# Patient Record
Sex: Female | Born: 1955 | Race: White | Hispanic: No | Marital: Single | Smoking: Never smoker
Health system: Southern US, Community
[De-identification: ages and names within clinical notes are randomized; demographics above are authoritative.]

## PROBLEM LIST (undated history)

## (undated) ENCOUNTER — Emergency Department (HOSPITAL_COMMUNITY)

## (undated) DIAGNOSIS — Z923 Personal history of irradiation: Secondary | ICD-10-CM

## (undated) DIAGNOSIS — I10 Essential (primary) hypertension: Secondary | ICD-10-CM

## (undated) DIAGNOSIS — E039 Hypothyroidism, unspecified: Secondary | ICD-10-CM

## (undated) DIAGNOSIS — C50919 Malignant neoplasm of unspecified site of unspecified female breast: Secondary | ICD-10-CM

## (undated) DIAGNOSIS — C801 Malignant (primary) neoplasm, unspecified: Secondary | ICD-10-CM

## (undated) DIAGNOSIS — E079 Disorder of thyroid, unspecified: Secondary | ICD-10-CM

## (undated) DIAGNOSIS — N3281 Overactive bladder: Secondary | ICD-10-CM

## (undated) DIAGNOSIS — K219 Gastro-esophageal reflux disease without esophagitis: Secondary | ICD-10-CM

## (undated) DIAGNOSIS — M1990 Unspecified osteoarthritis, unspecified site: Secondary | ICD-10-CM

## (undated) DIAGNOSIS — E785 Hyperlipidemia, unspecified: Secondary | ICD-10-CM

## (undated) DIAGNOSIS — E669 Obesity, unspecified: Secondary | ICD-10-CM

## (undated) DIAGNOSIS — R002 Palpitations: Secondary | ICD-10-CM

## (undated) HISTORY — DX: Essential (primary) hypertension: I10

## (undated) HISTORY — PX: KNEE ARTHROSCOPY: SHX127

## (undated) HISTORY — PX: JOINT REPLACEMENT: SHX530

## (undated) HISTORY — DX: Disorder of thyroid, unspecified: E07.9

## (undated) HISTORY — DX: Hyperlipidemia, unspecified: E78.5

## (undated) HISTORY — DX: Palpitations: R00.2

## (undated) HISTORY — DX: Obesity, unspecified: E66.9

---
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
## 1978-12-22 HISTORY — PX: NOSE SURGERY: SHX723

## 1999-01-10 ENCOUNTER — Other Ambulatory Visit: Admission: RE | Admit: 1999-01-10 | Discharge: 1999-01-10 | Payer: Self-pay | Admitting: Obstetrics & Gynecology

## 2000-02-04 ENCOUNTER — Other Ambulatory Visit: Admission: RE | Admit: 2000-02-04 | Discharge: 2000-02-04 | Payer: Self-pay | Admitting: Obstetrics & Gynecology

## 2001-09-30 ENCOUNTER — Other Ambulatory Visit: Admission: RE | Admit: 2001-09-30 | Discharge: 2001-09-30 | Payer: Self-pay | Admitting: Obstetrics & Gynecology

## 2001-12-08 ENCOUNTER — Ambulatory Visit (HOSPITAL_COMMUNITY): Admission: RE | Admit: 2001-12-08 | Discharge: 2001-12-08 | Payer: Self-pay | Admitting: Obstetrics and Gynecology

## 2002-11-16 ENCOUNTER — Other Ambulatory Visit: Admission: RE | Admit: 2002-11-16 | Discharge: 2002-11-16 | Payer: Self-pay | Admitting: Obstetrics & Gynecology

## 2003-12-26 ENCOUNTER — Other Ambulatory Visit: Admission: RE | Admit: 2003-12-26 | Discharge: 2003-12-26 | Payer: Self-pay | Admitting: Obstetrics & Gynecology

## 2005-01-15 ENCOUNTER — Other Ambulatory Visit: Admission: RE | Admit: 2005-01-15 | Discharge: 2005-01-15 | Payer: Self-pay | Admitting: Obstetrics & Gynecology

## 2005-12-22 HISTORY — PX: SHOULDER SURGERY: SHX246

## 2010-05-14 ENCOUNTER — Encounter: Admission: RE | Admit: 2010-05-14 | Discharge: 2010-05-14 | Payer: Self-pay | Admitting: Obstetrics & Gynecology

## 2012-11-29 ENCOUNTER — Other Ambulatory Visit (HOSPITAL_COMMUNITY): Payer: Self-pay | Admitting: Cardiovascular Disease

## 2012-11-29 DIAGNOSIS — R011 Cardiac murmur, unspecified: Secondary | ICD-10-CM

## 2012-11-29 DIAGNOSIS — R55 Syncope and collapse: Secondary | ICD-10-CM

## 2012-12-08 ENCOUNTER — Ambulatory Visit (HOSPITAL_COMMUNITY)
Admission: RE | Admit: 2012-12-08 | Discharge: 2012-12-08 | Disposition: A | Discharge status: Home / Self Care | Payer: PRIVATE HEALTH INSURANCE | Source: Ambulatory Visit | Attending: Cardiovascular Disease | Admitting: Cardiovascular Disease

## 2012-12-08 DIAGNOSIS — R011 Cardiac murmur, unspecified: Secondary | ICD-10-CM

## 2012-12-08 DIAGNOSIS — R55 Syncope and collapse: Secondary | ICD-10-CM

## 2012-12-08 NOTE — Progress Notes (Signed)
[facility]   2D echo completed 12/08/2012.   Veda Canning, RDCS

## 2014-01-03 ENCOUNTER — Ambulatory Visit (INDEPENDENT_AMBULATORY_CARE_PROVIDER_SITE_OTHER): Payer: PRIVATE HEALTH INSURANCE | Admitting: Cardiology

## 2014-01-03 ENCOUNTER — Encounter: Payer: Self-pay | Admitting: Cardiology

## 2014-01-03 VITALS — BP 122/66 | HR 65 | Ht 66.0 in | Wt 195.0 lb

## 2014-01-03 DIAGNOSIS — R079 Chest pain, unspecified: Secondary | ICD-10-CM

## 2014-01-03 DIAGNOSIS — R002 Palpitations: Secondary | ICD-10-CM

## 2014-01-03 DIAGNOSIS — E785 Hyperlipidemia, unspecified: Secondary | ICD-10-CM

## 2014-01-03 LAB — BASIC METABOLIC PANEL
BUN: 11 mg/dL (ref 6–23)
CALCIUM: 9 mg/dL (ref 8.4–10.5)
CHLORIDE: 109 meq/L (ref 96–112)
CO2: 27 mEq/L (ref 19–32)
Creatinine, Ser: 0.9 mg/dL (ref 0.4–1.2)
GFR: 72.69 mL/min (ref >60.00)
GLUCOSE: 101 mg/dL — AB (ref 70–99)
POTASSIUM: 4.4 meq/L (ref 3.5–5.1)
SODIUM: 141 meq/L (ref 135–145)

## 2014-01-03 LAB — TSH: TSH: 0.13 u[IU]/mL — ABNORMAL LOW (ref 0.35–5.50)

## 2014-01-03 LAB — LIPID PANEL
CHOL/HDL RATIO: 3
Cholesterol: 187 mg/dL (ref 0–200)
HDL: 65.4 mg/dL (ref >39.00)
LDL CALC: 113 mg/dL — AB (ref 0–99)
TRIGLYCERIDES: 44 mg/dL (ref 0.0–149.0)
VLDL: 8.8 mg/dL (ref 0.0–40.0)

## 2014-01-03 MED ORDER — METOPROLOL SUCCINATE ER 25 MG PO TB24: 25.0000 mg | ORAL_TABLET | Freq: Every day | ORAL | Status: DC

## 2014-01-03 MED ORDER — PANTOPRAZOLE SODIUM 40 MG PO TBEC: 40.0000 mg | DELAYED_RELEASE_TABLET | Freq: Every day | ORAL | Status: DC

## 2014-01-03 NOTE — Patient Instructions (Signed)
Your physician recommends that you have  lab work today--TSH/Lipid profile/BMET.   Your physician wants you to follow-up in: 1 year with Dr Aundra Dubin. (January 2016). You will receive a reminder letter in the mail two months in advance. If you don't receive a letter, please call our office to schedule the follow-up appointment.

## 2014-01-03 NOTE — Progress Notes (Signed)
Patient ID: [name], female   DOB: Dec 31, 1955, 58 y.o.   MRN: [id number] PCP: Dr. Arelia Sneddon  58 yo with history of palpitations and atypical chest pain presents for cardiology evaluation.  She has been seen by Dr. Rollene Fare in the past and is seen by me for the first time today.  I reviewed all her old records.  She is now on Toprol XL for palpitations.  These have been thought in the past to be due to PACs.  She is doing well with less palpitations on the beta blocker.  She is under less stress at work now also.  She continues to have rare atypical chest pain.  She does not get exertional chest pain or dyspnea.  Last echo was in 12/13 and showed normal LV and RV systolic function.   ECG: NSR, normal  Labs (10/13): K 5, creatinine 0.76, LDL 139, HDL 71  PMH: 1. Hyperlipidemia 2. Hypothyroidism 3. Palpitations: Suspect due to PACs. 4. Atypical chest pain: 8/11 Cardiolite showed no ischemia or infarction.  Echo (12/13) with EF 55-60%, mild LVH, PA systolic pressure 34 mmHg.   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  FH: Mother with heart disease.   ROS: All systems reviewed and negative except as per HPI.   Current Outpatient Prescriptions  Medication Sig Dispense Refill  . estradiol (ESTRACE) 0.5 MG tablet Take 0.5 mg by mouth daily.      Marland Kitchen levothyroxine (SYNTHROID, LEVOTHROID) 100 MCG tablet Take 100 mcg by mouth daily before breakfast.      . medroxyPROGESTERone (PROVERA) 2.5 MG tablet Take 2.5 mg by mouth daily.      . metoprolol succinate (TOPROL-XL) 25 MG 24 hr tablet Take 1 tablet (25 mg total) by mouth daily.  90 tablet  3  . pantoprazole (PROTONIX) 40 MG tablet Take 1 tablet (40 mg total) by mouth daily.  90 tablet  3  . simvastatin (ZOCOR) 20 MG tablet Take 20 mg by mouth daily.       No current facility-administered medications for this visit.    BP 122/66  Pulse 65  Ht 5\' 6"  (1.676 m)  Wt 88.451 kg (195 lb)  BMI 31.49  kg/m2 General: NAD Neck: No JVD, no thyromegaly or thyroid nodule.  Lungs: Clear to auscultation bilaterally with normal respiratory effort. CV: Nondisplaced PMI.  Heart regular S1/S2, no S3/S4, no murmur.  No peripheral edema.  No carotid bruit.  Normal pedal pulses.  Abdomen: Soft, nontender, no hepatosplenomegaly, no distention.  Skin: Intact without lesions or rashes.  Neurologic: Alert and oriented x 3.  Psych: Normal affect. Extremities: No clubbing or cyanosis.  HEENT: Normal.   Assessment/Plan: 1. Palpitations: Suspect PACs.  Echo was unremarkable.  Well-suppressed by Toprol XL.  Continue Toprol XL.  Check TSH today.  2. Chest pain: Atypical.  Negative Cardiolite in 8/11.   3. Hyperlipidemia: I will check lipids today.   Followup in 1 year.   Loralie Champagne 01/03/2014

## 2014-01-06 ENCOUNTER — Telehealth: Payer: Self-pay | Admitting: Cardiology

## 2014-01-06 NOTE — Telephone Encounter (Signed)
Advised patient of lab results. Dr Stann Mainland monitors her thyroid, results sent to him.

## 2014-01-06 NOTE — Telephone Encounter (Signed)
New message   Test results.

## 2014-01-10 NOTE — Telephone Encounter (Signed)
Left message to contact Dr Stann Mainland for follow up labs per Dr Aundra Dubin, call back if any questions

## 2014-12-06 ENCOUNTER — Ambulatory Visit (INDEPENDENT_AMBULATORY_CARE_PROVIDER_SITE_OTHER): Payer: Commercial Managed Care - PPO | Admitting: Nurse Practitioner

## 2014-12-06 ENCOUNTER — Encounter: Payer: Self-pay | Admitting: Nurse Practitioner

## 2014-12-06 VITALS — BP 122/76 | HR 82 | Ht 66.0 in | Wt 215.0 lb

## 2014-12-06 DIAGNOSIS — R079 Chest pain, unspecified: Secondary | ICD-10-CM

## 2014-12-06 DIAGNOSIS — R002 Palpitations: Secondary | ICD-10-CM

## 2014-12-06 MED ORDER — HYDROCHLOROTHIAZIDE 25 MG PO TABS: 25.0000 mg | ORAL_TABLET | Freq: Every day | ORAL | Status: DC

## 2014-12-06 NOTE — Patient Instructions (Signed)
See me in one month  Restrict salt  Medium compression stockings  Limit NSAID use  I am starting HCTZ 25 mg to take each day  EKG today  Call the [place] office at [phone] if you have any questions, problems or concerns.

## 2014-12-06 NOTE — Progress Notes (Signed)
[name] Date of Birth: Apr 03, 1956 Medical Record #[id number]  History of Present Illness: Ms. Muckle is seen back today for a work in visit. Seen for Dr. Aundra Dubin. She is a 58 yo with history of palpitations and atypical chest pain. he has been seen by Dr. Rollene Fare in the past and is saw Dr. Aundra Dubin in January of 2015 for the first time. She is now on Toprol XL for palpitations. These have been thought in the past to be due to PACs.  Last echo was in 12/13 and showed normal LV and RV systolic function.   She was doing well when she was seen in January.   Comes in today. Here alone. Has noted swelling in her legs for at least a week. BP found to be elevated at work. She is eating out. Most likely getting too much salt.  No support stockings. Has gained weight. Still with some palpitations/chest pain - but not too bothersome for her. She is very limited by bursitis in her hip - not able to exercise. Has gained 20 pounds since she was here in January. She is frustrated. Taking NSAID probably every day. Was given oxycodone but does not like to take.   Current Outpatient Prescriptions  Medication Sig Dispense Refill  . clobetasol cream (TEMOVATE) 2.13 % Apply 1 application topically 2 (two) times daily.    Marland Kitchen levothyroxine (SYNTHROID, LEVOTHROID) 100 MCG tablet Take 88 mcg by mouth daily before breakfast.     . metoprolol succinate (TOPROL-XL) 25 MG 24 hr tablet Take 1 tablet (25 mg total) by mouth daily. 90 tablet 3  . mirabegron ER (MYRBETRIQ) 50 MG TB24 tablet Take 50 mg by mouth daily.    . Multiple Vitamin (MULTIVITAMIN) capsule Take 1 capsule by mouth daily.    . NON FORMULARY daily. Female support formula    . pantoprazole (PROTONIX) 40 MG tablet Take 1 tablet (40 mg total) by mouth daily. 90 tablet 3  . simvastatin (ZOCOR) 20 MG tablet Take 20 mg by mouth daily.     No current facility-administered medications for this visit.    No Known Allergies  Past Medical History  Diagnosis  Date  . Thyroid disease    PMH: 1. Hyperlipidemia 2. Hypothyroidism 3. Palpitations: Suspect due to PACs. 4. Atypical chest pain: 8/11 Cardiolite showed no ischemia or infarction. Echo (12/13) with EF 55-60%, mild LVH, PA systolic pressure 34 mmHg.    Past Surgical History  Procedure Laterality Date  . Shoulder surgery Left 2007  . Nose surgery  1980  . Tonsillectomy  1964  . Bladder stem stretch  1966    History  Smoking status  . Never Smoker   Smokeless tobacco  . Not on file    History  Alcohol Use No    Family History  Problem Relation Age of Onset  . Heart Problems Father     cardiac arrest    Review of Systems: The review of systems is per the HPI.  All other systems were reviewed and are negative.  Physical Exam: BP 122/76 mmHg  Pulse 82  Ht 5\' 6"  (1.676 m)  Wt 215 lb (97.523 kg)  BMI 34.72 kg/m2  SpO2 97%  Her BP is 150/90 by me.  Patient is very pleasant and in no acute distress. Skin is warm and dry. Color is normal.  HEENT is unremarkable. Normocephalic/atraumatic. PERRL. Sclera are nonicteric. Neck is supple. No masses. No JVD. Lungs are clear. Cardiac exam shows a regular rate  and rhythm. Abdomen is soft. Extremities are with 1+ edema. Gait and ROM are intact. No gross neurologic deficits noted.  Wt Readings from Last 3 Encounters:  12/06/14 215 lb (97.523 kg)  01/03/14 195 lb (88.451 kg)    LABORATORY DATA/PROCEDURES:  Lab Results  Component Value Date   GLUCOSE 101* 01/03/2014   CHOL 187 01/03/2014   TRIG 44.0 01/03/2014   HDL 65.40 01/03/2014   LDLCALC 113* 01/03/2014   NA 141 01/03/2014   K 4.4 01/03/2014   CL 109 01/03/2014   CREATININE 0.9 01/03/2014   BUN 11 01/03/2014   CO2 27 01/03/2014   TSH 0.13* 01/03/2014    BNP (last 3 results) No results for input(s): PROBNP in the last 8760 hours.  Echo Study Conclusions from 11/2012  - Left ventricle: The cavity size was normal. There was mild concentric hypertrophy.  Systolic function was normal. The estimated ejection fraction was in the range of 55% to 60%. Wall motion was normal; there were no regional wall motion abnormalities. Left ventricular diastolic function parameters were normal. - Mitral valve: Valve area by pressure half-time: 2.44cm^2. - Atrial septum: No defect or patent foramen ovale was identified. - Pulmonary arteries: PA peak pressure: 46mm Hg (S). Impressions:  - The right ventricular systolic pressure was increased consistent with mild pulmonary hypertension. No significant valvular disease-nl. chamber dimensions. Mild concentric LVH.   Assessment / Plan: 1. Swelling - most likely multifactorial.   2. HTN - Will add low dose HCTZ 25 mg - she brought in baseline labs which look fine.   3. Palpitations - continue beta blocker - does not seem to be much of an issue at this time.  4. Weight gain/obesity - TSH ok. Not able to exercise.   5. Hip pain - this is actually what I feel is causing this whole cascade of her symptoms. She is thinking about a 2nd opinion. Needs to be mobile in order to work on her weight, BP, etc.  Will start HCTZ. Advise support stockings and salt restriction. Limit salt. EKG today. See back in a month with her BP diary.   Patient is agreeable to this plan and will call if any problems develop in the interim.   Burtis Junes, RN, [place] [address] [place], [place]  [address] [phone]

## 2014-12-31 ENCOUNTER — Other Ambulatory Visit: Payer: Self-pay | Admitting: Cardiology

## 2015-01-05 ENCOUNTER — Encounter: Payer: Self-pay | Admitting: Nurse Practitioner

## 2015-01-05 ENCOUNTER — Ambulatory Visit (INDEPENDENT_AMBULATORY_CARE_PROVIDER_SITE_OTHER): Payer: Commercial Managed Care - PPO | Admitting: Nurse Practitioner

## 2015-01-05 VITALS — BP 128/82 | HR 64 | Ht 66.0 in | Wt 201.8 lb

## 2015-01-05 DIAGNOSIS — I10 Essential (primary) hypertension: Secondary | ICD-10-CM

## 2015-01-05 DIAGNOSIS — R609 Edema, unspecified: Secondary | ICD-10-CM

## 2015-01-05 LAB — BASIC METABOLIC PANEL
BUN: 13 mg/dL (ref 6–23)
CO2: 30 mEq/L (ref 19–32)
Calcium: 9.7 mg/dL (ref 8.4–10.5)
Chloride: 102 mEq/L (ref 96–112)
Creatinine, Ser: 0.85 mg/dL (ref 0.40–1.20)
GFR: 73.41 mL/min (ref >60.00)
Glucose, Bld: 95 mg/dL (ref 70–99)
Potassium: 3.6 mEq/L (ref 3.5–5.1)
Sodium: 139 mEq/L (ref 135–145)

## 2015-01-05 MED ORDER — HYDROCHLOROTHIAZIDE 25 MG PO TABS: 25.0000 mg | ORAL_TABLET | Freq: Every day | ORAL | Status: DC

## 2015-01-05 NOTE — Progress Notes (Signed)
Cardiology Office Note   Date:  01/05/2015   ID:  GINA LEBLOND  DOB July 15, 1956  MRN [id number]  PCP:  Leonard Downing, MD  Cardiologist:  Dr. Aundra Dubin    Chief Complaint  Patient presents with  . Hypertension    1 month check - seen for Dr. Aundra Dubin. Started on HCTZ  . Edema      History of Present Illness: [name] is a 59 y.o. female who presents for a one month check. Seen for Dr. Aundra Dubin. She has a history of palpitations and atypical chest pain. She has been seen by Dr. Rollene Fare in the past and saw Dr. Aundra Dubin in January of 2015 for the first time. She is now on Toprol XL for palpitations. These have been thought in the past to be due to PACs. Last echo was in 12/13 and showed normal LV and RV systolic function.   She was doing well when she was seen in January of 2015.  I saw her a month ago with swelling and elevated BP. Had gained weight. Was using daily NSAID. We started her on HCTZ. Had a long discussion with her about weight/mobility, etc.   Comes in today. Here alone. Doing much better. She has lost almost 15 pounds. Now on 1200 calorie diet. Not really able to exercise because of her hip but feeling so much better. BP is good at home. Good here today as well. Tolerating her medicines. Very happy with how she is doing.    Past Medical History  Diagnosis Date  . Thyroid disease   . Palpitation   . Obesity   . HLD (hyperlipidemia)     PMH: 1. Hyperlipidemia 2. Hypothyroidism 3. Palpitations: Suspect due to PACs. 4. Atypical chest pain: 8/11 Cardiolite showed no ischemia or infarction. Echo (12/13) with EF 55-60%, mild LVH, PA systolic pressure 34 mmHg.  Past Surgical History  Procedure Laterality Date  . Shoulder surgery Left 2007  . Nose surgery  1980  . Tonsillectomy  1964  . Bladder stem stretch  1966     Current Outpatient Prescriptions  Medication Sig Dispense Refill  . clobetasol cream (TEMOVATE) 5.68 % Apply 1 application topically 2  (two) times daily.    . hydrochlorothiazide (HYDRODIURIL) 25 MG tablet Take 1 tablet (25 mg total) by mouth daily. 90 tablet 3  . levothyroxine (SYNTHROID, LEVOTHROID) 100 MCG tablet Take 88 mcg by mouth daily before breakfast.     . metoprolol succinate (TOPROL-XL) 25 MG 24 hr tablet TAKE 1 TABLET BY MOUTH EVERY DAY 90 tablet 3  . mirabegron ER (MYRBETRIQ) 50 MG TB24 tablet Take 50 mg by mouth daily.    . Multiple Vitamin (MULTIVITAMIN) capsule Take 1 capsule by mouth daily.    . NON FORMULARY daily. Female support formula    . pantoprazole (PROTONIX) 40 MG tablet TAKE 1 TABLET BY MOUTH EVERY DAY 90 tablet 3  . simvastatin (ZOCOR) 20 MG tablet Take 20 mg by mouth daily.     No current facility-administered medications for this visit.    Allergies:   Review of patient's allergies indicates no known allergies.    Social History:  The patient  reports that she has never smoked. She does not have any smokeless tobacco history on file. She reports that she does not drink alcohol or use illicit drugs.   Family History:  The patient's family history includes Heart Problems in her father; Heart attack in her mother; Heart disease in her mother.  ROS:  Please see the history of present illness.  Otherwise, review of systems is positive for appetite change.   All other systems are reviewed and negative.    PHYSICAL EXAM: VS:  BP 128/82 mmHg  Pulse 64  Ht 5\' 6"  (1.676 m)  Wt 201 lb 12.8 oz (91.536 kg)  BMI 32.59 kg/m2 , BMI Body mass index is 32.59 kg/(m^2). GEN: Well nourished, well developed, in no acute distress. Her weight is down 14 pounds over the last month.  HEENT: Normal.  Neck: Supple.  Cardiac: RRR; no murmurs, rubs, or gallops. No edema.  Respiratory:  Clear to auscultation bilaterally with normal work of breathing.  GI: Soft.  MS: No deformity or atrophy. Gait and ROM intact.  Skin: Warm and dry. Color normal.  Neuro:  Strength and sensation are intact with no gross focal  deficits.  Psych: Very pleasant and upbeat. Normal affect.   EKG:  EKG is not ordered today.   Recent Labs:  Lab Results  Component Value Date   GLUCOSE 101* 01/03/2014   CHOL 187 01/03/2014   TRIG 44.0 01/03/2014   HDL 65.40 01/03/2014   LDLCALC 113* 01/03/2014   NA 141 01/03/2014   K 4.4 01/03/2014   CL 109 01/03/2014   CREATININE 0.9 01/03/2014   BUN 11 01/03/2014   CO2 27 01/03/2014   TSH 0.13* 01/03/2014     Wt Readings from Last 3 Encounters:  01/05/15 201 lb 12.8 oz (91.536 kg)  12/06/14 215 lb (97.523 kg)  01/03/14 195 lb (88.451 kg)      Other Studies Reviewed:  Echo Study Conclusions from 11/2012  - Left ventricle: The cavity size was normal. There was mild concentric hypertrophy. Systolic function was normal. The estimated ejection fraction was in the range of 55% to 60%. Wall motion was normal; there were no regional wall motion abnormalities. Left ventricular diastolic function parameters were normal. - Mitral valve: Valve area by pressure half-time: 2.44cm^2. - Atrial septum: No defect or patent foramen ovale was identified. - Pulmonary arteries: PA peak pressure: 49mm Hg (S). Impressions:  - The right ventricular systolic pressure was increased consistent with mild pulmonary hypertension. No significant valvular disease-nl. chamber dimensions. Mild concentric LVH.  ASSESSMENT AND PLAN:  1. Swelling - most likely multifactorial. Now resolved with diet change and HCTZ.   2. HTN - greatly improved with current therapy and salt restriction.  3. Palpitations - continue beta blocker - does not seem to be much of an issue at this time.  4. Weight gain/obesity - actively losing weight.   5. Hip pain - chronic.  She is doing well. Recheck BMET today. I have left her on her current regimen. She has follow up with Dr. Aundra Dubin next month. I will be happy to see back as needed.   Patient is agreeable to this plan and will call if  any problems develop in the interim.    Orders Placed This Encounter  Procedures  . Basic metabolic panel    Disposition:   FU with Aundra Dubin as planned next month.    Amie Critchley, NP  01/05/2015 8:48 AM    [address], [place], [place]  [address] Phone: [phone]; Fax: [phone]

## 2015-01-05 NOTE — Patient Instructions (Addendum)
We will be checking the following labs today BMET  Stay on your current medicines - I refilled the HCTZ today  Keep up the good work  See Dr. Aundra Dubin as planned  Call the [place] office at [phone] if you have any questions, problems or concerns.

## 2015-01-29 ENCOUNTER — Ambulatory Visit: Payer: PRIVATE HEALTH INSURANCE | Admitting: Cardiology

## 2015-02-01 ENCOUNTER — Encounter: Payer: Self-pay | Admitting: Cardiology

## 2015-02-01 ENCOUNTER — Ambulatory Visit (INDEPENDENT_AMBULATORY_CARE_PROVIDER_SITE_OTHER): Payer: Commercial Managed Care - PPO | Admitting: Cardiology

## 2015-02-01 VITALS — BP 126/62 | HR 75 | Ht 66.0 in | Wt 197.0 lb

## 2015-02-01 DIAGNOSIS — R002 Palpitations: Secondary | ICD-10-CM

## 2015-02-01 DIAGNOSIS — E785 Hyperlipidemia, unspecified: Secondary | ICD-10-CM

## 2015-02-01 DIAGNOSIS — R0789 Other chest pain: Secondary | ICD-10-CM | POA: Diagnosis not present

## 2015-02-01 NOTE — Patient Instructions (Signed)
Your physician recommends that you to have a  lipid profile today.  You have been referred to [facility] to establish with a primary care doctor.   Your physician wants you to follow-up in: 1 year with Dr Aundra Dubin. (February 2017).  You will receive a reminder letter in the mail two months in advance. If you don't receive a letter, please call our office to schedule the follow-up appointment.

## 2015-02-02 LAB — LIPID PANEL
CHOLESTEROL: 164 mg/dL (ref 0–200)
HDL: 56.1 mg/dL (ref >39.00)
LDL Cholesterol: 88 mg/dL (ref 0–99)
NONHDL: 107.9
Total CHOL/HDL Ratio: 3
Triglycerides: 102 mg/dL (ref 0.0–149.0)
VLDL: 20.4 mg/dL (ref 0.0–40.0)

## 2015-02-03 NOTE — Progress Notes (Signed)
Patient ID: [name], female   DOB: [birth year], 59 y.o.   MRN: [id number] PCP: Dr. Arelia Sneddon  59 yo with history of palpitations and atypical chest pain presents for cardiology evaluation.  She is now on Toprol XL for palpitations.  These have been thought in the past to be due to PACs.  She is doing well with less palpitations on the beta blocker.  She continues to have rare atypical chest pain.  She does not get exertional chest pain or dyspnea.  Last echo was in 12/13 and showed normal LV and RV systolic function.   She was seen by Truitt Merle recently due to lower extremity edema and high BP.  She had been using NSAIDs daily. She stopped NSAIDs and has been working on weight loss.  Weight is down 4 lbs today.  She is now on HCTZ.   Labs (10/13): K 5, creatinine 0.76, LDL 139, HDL 71 Labs (1/15): LDL 113 Labs (1/16): K 3.6, creatinine 0.85  PMH: 1. Hyperlipidemia 2. Hypothyroidism 3. Palpitations: Suspect due to PACs. 4. Atypical chest pain: 8/11 Cardiolite showed no ischemia or infarction.  Echo (12/13) with EF 55-60%, mild LVH, PA systolic pressure 34 mmHg.   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   ROS: All systems reviewed and negative except as per HPI.   Current Outpatient Prescriptions  Medication Sig Dispense Refill  . clobetasol cream (TEMOVATE) 2.50 % Apply 1 application topically 2 (two) times daily.    . hydrochlorothiazide (HYDRODIURIL) 25 MG tablet Take 1 tablet (25 mg total) by mouth daily. 90 tablet 3  . levothyroxine (SYNTHROID, LEVOTHROID) 88 MCG tablet Take 88 mcg by mouth daily.  12  . metoprolol succinate (TOPROL-XL) 25 MG 24 hr tablet TAKE 1 TABLET BY MOUTH EVERY DAY (Patient taking differently: TAKE 1/2 TABLET BY MOUTH EVERY DAY) 90 tablet 3  . mirabegron ER (MYRBETRIQ) 50 MG TB24 tablet Take 50 mg by mouth daily.    . Multiple Vitamin (MULTIVITAMIN) capsule Take 1 capsule by mouth  daily.    . NON FORMULARY daily. Female support formula    . pantoprazole (PROTONIX) 40 MG tablet TAKE 1 TABLET BY MOUTH EVERY DAY 90 tablet 3  . simvastatin (ZOCOR) 20 MG tablet Take 20 mg by mouth daily.    . VESICARE 10 MG tablet Take 1 tablet by mouth daily.  0   No current facility-administered medications for this visit.    BP 126/62 mmHg  Pulse 75  Ht 5\' 6"  (1.676 m)  Wt 197 lb (89.359 kg)  BMI 31.81 kg/m2  SpO2 98% General: NAD Neck: No JVD, no thyromegaly or thyroid nodule.  Lungs: Clear to auscultation bilaterally with normal respiratory effort. CV: Nondisplaced PMI.  Heart regular S1/S2, no S3/S4, no murmur.  No peripheral edema.  No carotid bruit.  Normal pedal pulses.  Abdomen: Soft, nontender, no hepatosplenomegaly, no distention.  Skin: Intact without lesions or rashes.  Neurologic: Alert and oriented x 3.  Psych: Normal affect. Extremities: No clubbing or cyanosis.  HEENT: Normal.   Assessment/Plan: 1. Palpitations: Rare, suspect PACs.  Well-suppressed by Toprol XL.  Continue Toprol XL.   2. Chest pain: Atypical.  Negative Cardiolite in 8/11.   3. Hyperlipidemia: I will check lipids today.  4. HTN: BP controlled.   Followup in 1 year.   Loralie Champagne 02/03/2015

## 2015-04-30 ENCOUNTER — Ambulatory Visit: Payer: Commercial Managed Care - PPO | Admitting: Internal Medicine

## 2015-05-10 ENCOUNTER — Encounter: Payer: Self-pay | Admitting: Internal Medicine

## 2015-05-10 ENCOUNTER — Ambulatory Visit (INDEPENDENT_AMBULATORY_CARE_PROVIDER_SITE_OTHER): Payer: Commercial Managed Care - PPO | Admitting: Internal Medicine

## 2015-05-10 VITALS — BP 108/78 | HR 82 | Temp 98.0°F | Resp 14 | Ht 66.0 in | Wt 192.8 lb

## 2015-05-10 DIAGNOSIS — Z0000 Encounter for general adult medical examination without abnormal findings: Secondary | ICD-10-CM

## 2015-05-10 DIAGNOSIS — E669 Obesity, unspecified: Secondary | ICD-10-CM

## 2015-05-10 DIAGNOSIS — E039 Hypothyroidism, unspecified: Secondary | ICD-10-CM

## 2015-05-10 DIAGNOSIS — E785 Hyperlipidemia, unspecified: Secondary | ICD-10-CM

## 2015-05-10 DIAGNOSIS — I10 Essential (primary) hypertension: Secondary | ICD-10-CM

## 2015-05-10 NOTE — Patient Instructions (Addendum)
We will have you see the sports medicine doctor to see what answers we can get you. I will also give you some information today about it.   Come back with [place] in about 6-12 months. Think about getting the colonoscopy in the next year or two to make sure you do not have more polyps.   Trochanteric Bursitis You have hip pain due to trochanteric bursitis. Bursitis means that the sack near the outside of the hip is filled with fluid and inflamed. This sack is made up of protective soft tissue. The pain from trochanteric bursitis can be severe and keep you from sleep. It can radiate to the buttocks or down the outside of the thigh to the knee. The pain is almost always worse when rising from the seated or lying position and with walking. Pain can improve after you take a few steps. It happens more often in people with hip joint and lumbar spine problems, such as arthritis or previous surgery. Very rarely the trochanteric bursa can become infected, and antibiotics and/or surgery may be needed. Treatment often includes an injection of local anesthetic mixed with cortisone medicine. This medicine is injected into the area where it is most tender over the hip. Repeat injections may be necessary if the response to treatment is slow. You can apply ice packs over the tender area for 30 minutes every 2 hours for the next few days. Anti-inflammatory and/or narcotic pain medicine may also be helpful. Limit your activity for the next few days if the pain continues. See your caregiver in 5-10 days if you are not greatly improved.  SEEK IMMEDIATE MEDICAL CARE IF:  You develop severe pain, fever, or increased redness.  You have pain that radiates below the knee. EXERCISES STRETCHING EXERCISES - Trochanteric Bursitis  These exercises may help you when beginning to rehabilitate your injury. Your symptoms may resolve with or without further involvement from your physician, physical therapist, or athletic trainer. While  completing these exercises, remember:   Restoring tissue flexibility helps normal motion to return to the joints. This allows healthier, less painful movement and activity.  An effective stretch should be held for at least 30 seconds.  A stretch should never be painful. You should only feel a gentle lengthening or release in the stretched tissue. STRETCH - Iliotibial Band  On the floor or bed, lie on your side so your injured leg is on top. Bend your knee and grab your ankle.  Slowly bring your knee back so that your thigh is in line with your trunk. Keep your heel at your buttocks and gently arch your back so your head, shoulders and hips line up.  Slowly lower your leg so that your knee approaches the floor/bed until you feel a gentle stretch on the outside of your thigh. If you do not feel a stretch and your knee will not fall farther, place the heel of your opposite foot on top of your knee and pull your thigh down farther.  Hold this stretch for __________ seconds.  Repeat __________ times. Complete this exercise __________ times per day. STRETCH - Hamstrings, Supine   Lie on your back. Loop a belt or towel over the ball of your foot as shown.  Straighten your knee and slowly pull on the belt to raise your injured leg. Do not allow the knee to bend. Keep your opposite leg flat on the floor.  Raise the leg until you feel a gentle stretch behind your knee or thigh. Hold  this position for __________ seconds.  Repeat __________ times. Complete this stretch __________ times per day. STRETCH - Quadriceps, Prone   Lie on your stomach on a firm surface, such as a bed or padded floor.  Bend your knee and grasp your ankle. If you are unable to reach your ankle or pant leg, use a belt around your foot to lengthen your reach.  Gently pull your heel toward your buttocks. Your knee should not slide out to the side. You should feel a stretch in the front of your thigh and/or knee.  Hold this  position for __________ seconds.  Repeat __________ times. Complete this stretch __________ times per day. STRETCHING - Hip Flexors, Lunge Half kneel with your knee on the floor and your opposite knee bent and directly over your ankle.  Keep good posture with your head over your shoulders. Tighten your buttocks to point your tailbone downward; this will prevent your back from arching too much.  You should feel a gentle stretch in the front of your thigh and/or hip. If you do not feel any resistance, slightly slide your opposite foot forward and then slowly lunge forward so your knee once again lines up over your ankle. Be sure your tailbone remains pointed downward.  Hold this stretch for __________ seconds.  Repeat __________ times. Complete this stretch __________ times per day. STRETCH - Adductors, Lunge  While standing, spread your legs.  Lean away from your injured leg by bending your opposite knee. You may rest your hands on your thigh for balance.  You should feel a stretch in your inner thigh. Hold for __________ seconds.  Repeat __________ times. Complete this exercise __________ times per day. Document Released: 01/15/2005 Document Revised: 04/24/2014 Document Reviewed: 03/22/2009 [facility] Patient Information 2015 [place], [place]. This information is not intended to replace advice given to you by your health care provider. Make sure you discuss any questions you have with your health care provider.   Health Maintenance Adopting a healthy lifestyle and getting preventive care can go a long way to promote health and wellness. Talk with your health care provider about what schedule of regular examinations is right for you. This is a good chance for you to check in with your provider about disease prevention and staying healthy. In between checkups, there are plenty of things you can do on your own. Experts have done a lot of research about which lifestyle changes and preventive  measures are most likely to keep you healthy. Ask your health care provider for more information. WEIGHT AND DIET  Eat a healthy diet  Be sure to include plenty of vegetables, fruits, low-fat dairy products, and lean protein.  Do not eat a lot of foods high in solid fats, added sugars, or salt.  Get regular exercise. This is one of the most important things you can do for your health.  Most adults should exercise for at least 150 minutes each week. The exercise should increase your heart rate and make you sweat (moderate-intensity exercise).  Most adults should also do strengthening exercises at least twice a week. This is in addition to the moderate-intensity exercise.  Maintain a healthy weight  Body mass index (BMI) is a measurement that can be used to identify possible weight problems. It estimates body fat based on height and weight. Your health care provider can help determine your BMI and help you achieve or maintain a healthy weight.  For females 50 years of age and older:   A BMI  below 18.5 is considered underweight.  A BMI of 18.5 to 24.9 is normal.  A BMI of 25 to 29.9 is considered overweight.  A BMI of 30 and above is considered obese.  Watch levels of cholesterol and blood lipids  You should start having your blood tested for lipids and cholesterol at 57 years of age, then have this test every 5 years.  You may need to have your cholesterol levels checked more often if:  Your lipid or cholesterol levels are high.  You are older than 57 years of age.  You are at high risk for heart disease.  CANCER SCREENING   Lung Cancer  Lung cancer screening is recommended for adults 48-10 years old who are at high risk for lung cancer because of a history of smoking.  A yearly low-dose CT scan of the lungs is recommended for people who:  Currently smoke.  Have quit within the past 15 years.  Have at least a 30-pack-year history of smoking. A pack year is smoking  an average of one pack of cigarettes a day for 1 year.  Yearly screening should continue until it has been 15 years since you quit.  Yearly screening should stop if you develop a health problem that would prevent you from having lung cancer treatment.  Breast Cancer  Practice breast self-awareness. This means understanding how your breasts normally appear and feel.  It also means doing regular breast self-exams. Let your health care provider know about any changes, no matter how small.  If you are in your 20s or 30s, you should have a clinical breast exam (CBE) by a health care provider every 1-3 years as part of a regular health exam.  If you are 34 or older, have a CBE every year. Also consider having a breast X-ray (mammogram) every year.  If you have a family history of breast cancer, talk to your health care provider about genetic screening.  If you are at high risk for breast cancer, talk to your health care provider about having an MRI and a mammogram every year.  Breast cancer gene (BRCA) assessment is recommended for women who have family members with BRCA-related cancers. BRCA-related cancers include:  Breast.  Ovarian.  Tubal.  Peritoneal cancers.  Results of the assessment will determine the need for genetic counseling and BRCA1 and BRCA2 testing. Cervical Cancer Routine pelvic examinations to screen for cervical cancer are no longer recommended for nonpregnant women who are considered low risk for cancer of the pelvic organs (ovaries, uterus, and vagina) and who do not have symptoms. A pelvic examination may be necessary if you have symptoms including those associated with pelvic infections. Ask your health care provider if a screening pelvic exam is right for you.   The Pap test is the screening test for cervical cancer for women who are considered at risk.  If you had a hysterectomy for a problem that was not cancer or a condition that could lead to cancer, then you  no longer need Pap tests.  If you are older than 65 years, and you have had normal Pap tests for the past 10 years, you no longer need to have Pap tests.  If you have had past treatment for cervical cancer or a condition that could lead to cancer, you need Pap tests and screening for cancer for at least 20 years after your treatment.  If you no longer get a Pap test, assess your risk factors if they change (such as  having a new sexual partner). This can affect whether you should start being screened again.  Some women have medical problems that increase their chance of getting cervical cancer. If this is the case for you, your health care provider may recommend more frequent screening and Pap tests.  The human papillomavirus (HPV) test is another test that may be used for cervical cancer screening. The HPV test looks for the virus that can cause cell changes in the cervix. The cells collected during the Pap test can be tested for HPV.  The HPV test can be used to screen women 55 years of age and older. Getting tested for HPV can extend the interval between normal Pap tests from three to five years.  An HPV test also should be used to screen women of any age who have unclear Pap test results.  After 57 years of age, women should have HPV testing as often as Pap tests.  Colorectal Cancer  This type of cancer can be detected and often prevented.  Routine colorectal cancer screening usually begins at 57 years of age and continues through 56 years of age.  Your health care provider may recommend screening at an earlier age if you have risk factors for colon cancer.  Your health care provider may also recommend using home test kits to check for hidden blood in the stool.  A small camera at the end of a tube can be used to examine your colon directly (sigmoidoscopy or colonoscopy). This is done to check for the earliest forms of colorectal cancer.  Routine screening usually begins at age  34.  Direct examination of the colon should be repeated every 5-10 years through 57 years of age. However, you may need to be screened more often if early forms of precancerous polyps or small growths are found. Skin Cancer  Check your skin from head to toe regularly.  Tell your health care provider about any new moles or changes in moles, especially if there is a change in a mole's shape or color.  Also tell your health care provider if you have a mole that is larger than the size of a pencil eraser.  Always use sunscreen. Apply sunscreen liberally and repeatedly throughout the day.  Protect yourself by wearing long sleeves, pants, a wide-brimmed hat, and sunglasses whenever you are outside. HEART DISEASE, DIABETES, AND HIGH BLOOD PRESSURE   Have your blood pressure checked at least every 1-2 years. High blood pressure causes heart disease and increases the risk of stroke.  If you are between 2 years and 51 years old, ask your health care provider if you should take aspirin to prevent strokes.  Have regular diabetes screenings. This involves taking a blood sample to check your fasting blood sugar level.  If you are at a normal weight and have a low risk for diabetes, have this test once every three years after 57 years of age.  If you are overweight and have a high risk for diabetes, consider being tested at a younger age or more often. PREVENTING INFECTION  Hepatitis B  If you have a higher risk for hepatitis B, you should be screened for this virus. You are considered at high risk for hepatitis B if:  You were born in a country where hepatitis B is common. Ask your health care provider which countries are considered high risk.  Your parents were born in a high-risk country, and you have not been immunized against hepatitis B (hepatitis B vaccine).  You have HIV or AIDS.  You use needles to inject street drugs.  You live with someone who has hepatitis B.  You have had sex  with someone who has hepatitis B.  You get hemodialysis treatment.  You take certain medicines for conditions, including cancer, organ transplantation, and autoimmune conditions. Hepatitis C  Blood testing is recommended for:  Everyone born from [birth year] through 1963.  Anyone with known risk factors for hepatitis C. Sexually transmitted infections (STIs)  You should be screened for sexually transmitted infections (STIs) including gonorrhea and chlamydia if:  You are sexually active and are younger than 57 years of age.  You are older than 57 years of age and your health care provider tells you that you are at risk for this type of infection.  Your sexual activity has changed since you were last screened and you are at an increased risk for chlamydia or gonorrhea. Ask your health care provider if you are at risk.  If you do not have HIV, but are at risk, it may be recommended that you take a prescription medicine daily to prevent HIV infection. This is called pre-exposure prophylaxis (PrEP). You are considered at risk if:  You are sexually active and do not regularly use condoms or know the HIV status of your partner(s).  You take drugs by injection.  You are sexually active with a partner who has HIV. Talk with your health care provider about whether you are at high risk of being infected with HIV. If you choose to begin PrEP, you should first be tested for HIV. You should then be tested every 3 months for as long as you are taking PrEP.  PREGNANCY   If you are premenopausal and you may become pregnant, ask your health care provider about preconception counseling.  If you may become pregnant, take 400 to 800 micrograms (mcg) of folic acid every day.  If you want to prevent pregnancy, talk to your health care provider about birth control (contraception). OSTEOPOROSIS AND MENOPAUSE   Osteoporosis is a disease in which the bones lose minerals and strength with aging. This can result  in serious bone fractures. Your risk for osteoporosis can be identified using a bone density scan.  If you are 86 years of age or older, or if you are at risk for osteoporosis and fractures, ask your health care provider if you should be screened.  Ask your health care provider whether you should take a calcium or vitamin D supplement to lower your risk for osteoporosis.  Menopause may have certain physical symptoms and risks.  Hormone replacement therapy may reduce some of these symptoms and risks. Talk to your health care provider about whether hormone replacement therapy is right for you.  HOME CARE INSTRUCTIONS   Schedule regular health, dental, and eye exams.  Stay current with your immunizations.   Do not use any tobacco products including cigarettes, chewing tobacco, or electronic cigarettes.  If you are pregnant, do not drink alcohol.  If you are breastfeeding, limit how much and how often you drink alcohol.  Limit alcohol intake to no more than 1 drink per day for nonpregnant women. One drink equals 12 ounces of beer, 5 ounces of wine, or 1 ounces of hard liquor.  Do not use street drugs.  Do not share needles.  Ask your health care provider for help if you need support or information about quitting drugs.  Tell your health care provider if you often feel depressed.  Tell  your health care provider if you have ever been abused or do not feel safe at home. Document Released: 06/23/2011 Document Revised: 04/24/2014 Document Reviewed: 11/09/2013 [facility] Patient Information 2015 [place], [place]. This information is not intended to replace advice given to you by your health care provider. Make sure you discuss any questions you have with your health care provider.

## 2015-05-10 NOTE — Progress Notes (Signed)
Pre visit review using our clinic review tool, if applicable. No additional management support is needed unless otherwise documented below in the visit note. 

## 2015-05-11 ENCOUNTER — Encounter: Payer: Self-pay | Admitting: Internal Medicine

## 2015-05-11 DIAGNOSIS — I10 Essential (primary) hypertension: Secondary | ICD-10-CM | POA: Insufficient documentation

## 2015-05-11 DIAGNOSIS — E669 Obesity, unspecified: Secondary | ICD-10-CM | POA: Insufficient documentation

## 2015-05-11 DIAGNOSIS — Z0000 Encounter for general adult medical examination without abnormal findings: Secondary | ICD-10-CM | POA: Insufficient documentation

## 2015-05-11 DIAGNOSIS — E039 Hypothyroidism, unspecified: Secondary | ICD-10-CM | POA: Insufficient documentation

## 2015-05-11 NOTE — Assessment & Plan Note (Signed)
Talked to her about the fact that she is overdue for repeat colonoscopy repeat due in 5 years done 6 years ago) and she will think about it. Pap smear up to date. Mammogram up to date. She thinks she has gotten tetanus in last 10 years and will obtain her records. Talked to her about diet and exercise and she is working on losing weight and has lost some weight already by increasing exercise. She declines need for nutritionist today.

## 2015-05-11 NOTE — Assessment & Plan Note (Signed)
Stable on zocor 20 mg daily, no side effects, reviewed recent lipid panel at goal.

## 2015-05-11 NOTE — Assessment & Plan Note (Signed)
She is working on losing weight and has already lost about 10 pounds by increasing exercise and she intends to continue.

## 2015-05-11 NOTE — Assessment & Plan Note (Signed)
Taking metoprolol and hctz and doing well. Recent BMP reviewed and without complications. Continue same.

## 2015-05-11 NOTE — Progress Notes (Signed)
   Subjective:    Patient ID: [name], female    DOB: May 27, 1956, 59 y.o.   MRN: [id number]  HPI The patient is coming in as a new patient for wellness. She does have some chronic medical problems which are all stable (see A/P for status and treatment).   PMH, [facility], social history reviewed and updated.   Review of Systems  Constitutional: Negative for fever, activity change, appetite change, fatigue and unexpected weight change.  HENT: Negative.   Eyes: Negative.   Respiratory: Negative for cough, chest tightness, shortness of breath and wheezing.   Cardiovascular: Negative for chest pain, palpitations and leg swelling.  Gastrointestinal: Negative for nausea, abdominal pain, diarrhea, constipation and abdominal distention.  Musculoskeletal: Negative.   Skin: Negative.   Neurological: Negative.   Psychiatric/Behavioral: Negative.       Objective:   Physical Exam  Constitutional: She is oriented to person, place, and time. She appears well-developed and well-nourished.  HENT:  Head: Normocephalic and atraumatic.  Eyes: EOM are normal.  Neck: Normal range of motion.  Cardiovascular: Normal rate and regular rhythm.   Pulmonary/Chest: Effort normal. No respiratory distress. She has no wheezes.  Abdominal: Soft. She exhibits no distension. There is no tenderness. There is no rebound.  Musculoskeletal: She exhibits no edema.  Neurological: She is alert and oriented to person, place, and time. Coordination normal.  Skin: Skin is warm and dry.  Psychiatric: She has a normal mood and affect.   Filed Vitals:   05/10/15 1539  BP: 108/78  Pulse: 82  Temp: 98 F (36.7 C)  TempSrc: Oral  Resp: 14  Height: 5\' 6"  (1.676 m)  Weight: 192 lb 12.8 oz (87.454 kg)  SpO2: 95%      Assessment & Plan:

## 2015-05-11 NOTE — Assessment & Plan Note (Signed)
Will request her recent lab work that checked thyroid from her previous doctor. Stable for years per her. Synthroid 88 mcg daily.

## 2015-05-29 ENCOUNTER — Ambulatory Visit (INDEPENDENT_AMBULATORY_CARE_PROVIDER_SITE_OTHER): Payer: Commercial Managed Care - PPO

## 2015-05-29 ENCOUNTER — Encounter: Payer: Self-pay | Admitting: Family Medicine

## 2015-05-29 ENCOUNTER — Ambulatory Visit (INDEPENDENT_AMBULATORY_CARE_PROVIDER_SITE_OTHER): Payer: Commercial Managed Care - PPO | Admitting: Family Medicine

## 2015-05-29 ENCOUNTER — Encounter: Payer: Self-pay | Admitting: *Deleted

## 2015-05-29 ENCOUNTER — Ambulatory Visit (INDEPENDENT_AMBULATORY_CARE_PROVIDER_SITE_OTHER)
Admission: RE | Admit: 2015-05-29 | Discharge: 2015-05-29 | Disposition: A | Discharge status: Home / Self Care | Payer: Commercial Managed Care - PPO | Source: Ambulatory Visit | Attending: Family Medicine | Admitting: Family Medicine

## 2015-05-29 VITALS — BP 122/82 | HR 69 | Ht 66.0 in | Wt 193.0 lb

## 2015-05-29 DIAGNOSIS — M76892 Other specified enthesopathies of left lower limb, excluding foot: Secondary | ICD-10-CM

## 2015-05-29 DIAGNOSIS — M25552 Pain in left hip: Secondary | ICD-10-CM

## 2015-05-29 DIAGNOSIS — M65852 Other synovitis and tenosynovitis, left thigh: Secondary | ICD-10-CM | POA: Diagnosis not present

## 2015-05-29 DIAGNOSIS — M76899 Other specified enthesopathies of unspecified lower limb, excluding foot: Secondary | ICD-10-CM | POA: Insufficient documentation

## 2015-05-29 NOTE — Assessment & Plan Note (Signed)
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 I do think that this is likely contributing to most of her pain. We discussed icing regimen and home exercises. We will get x-rays to rule out any other bony abnormality with patient's differential also including impingement syndrome. Patient may also have some osteophytic changes that could be contribute in. We discussed icing regimen, proper shoe wear, and patient did work with Product/process development scientist today to learn home exercises in greater detail. Patient will follow-up again in 2-3 weeks for further evaluation and treatment.

## 2015-05-29 NOTE — Progress Notes (Signed)
Pre visit review using our clinic review tool, if applicable. No additional management support is needed unless otherwise documented below in the visit note. 

## 2015-05-29 NOTE — Patient Instructions (Signed)
Good to see you.  Ice 20 minutes 2 times daily. Usually after activity and before bed. Exercises 3 times a week.  Duexis 3 times daily for 3 days Vitamin D 2000 IU daily Turmeric 500mg  twice daily, start after the duexis.  Fish oil 2 grams daily Tennis shoes See me again in 3 weeks.

## 2015-05-29 NOTE — Progress Notes (Signed)
[name] [place], [place] [address] Phone: [phone] Subjective:    I'm seeing this patient by the request  of:  Olga Millers, MD  CC: Left hip pain  [id number] [name] is a 59 y.o. female coming in with complaint of  Left hip pain. Patient has been seen by other providers for this previously. Patient has been diagnosed with a greater trochanteric bursitis. Patient did have an injection in September that did help somewhat. Patient states that the pain never completely went away. Seems to be getting worse at this time. States that going up and down stairs can be very painful. States that she can have pain that seems to radiate to her left groin. No significant pain that radiates down her leg and no numbness or tingling. States though that it is affecting the way that she is walking. Patient states sometimes it feels like it wants to give out on her as well or pop. Patient rates the severity of pain a 7 out of 10. States that he can wake her up at night as well.  Past Medical History  Diagnosis Date  . Thyroid disease   . Palpitation   . Obesity   . HLD (hyperlipidemia)    Past Surgical History  Procedure Laterality Date  . Shoulder surgery Left 2007  . Nose surgery  1980  . Tonsillectomy  1964  . Bladder stem stretch  1966   History  Substance Use Topics  . Smoking status: Never Smoker   . Smokeless tobacco: Not on file  . Alcohol Use: No   No Known Allergies Family History  Problem Relation Age of Onset  . Heart Problems Father     cardiac arrest  . Heart attack Mother   . Heart disease Mother        Past medical history, social, surgical and family history all reviewed in electronic medical record.   Review of Systems: No headache, visual changes, nausea, vomiting, diarrhea, constipation, dizziness, abdominal pain, skin rash, fevers, chills, night sweats, weight loss, swollen lymph nodes, body aches, joint  swelling, muscle aches, chest pain, shortness of breath, mood changes.   Objective Blood pressure 122/82, pulse 69, height 5\' 6"  (1.676 m), weight 193 lb (87.544 kg), SpO2 97 %.  General: No apparent distress alert and oriented x3 mood and affect normal, dressed appropriately.  HEENT: Pupils equal, extraocular movements intact  Respiratory: Patient's speak in full sentences and does not appear short of breath  Cardiovascular: No lower extremity edema, non tender, no erythema  Skin: Warm dry intact with no signs of infection or rash on extremities or on axial skeleton.  Abdomen: Soft nontender  Neuro: Cranial nerves II through XII are intact, neurovascularly intact in all extremities with 2+ DTRs and 2+ pulses.  Lymph: No lymphadenopathy of posterior or anterior cervical chain or axillae bilaterally.  Gait normal with good balance and coordination.  MSK:  Non tender with full range of motion and good stability and symmetric strength and tone of shoulders, elbows, wrist,  knee and ankles bilaterally.  Hip: Left ROM IR: 15 Deg with moderate pain with internal rotation, ER: 45 Deg, Flexion: 100 Deg with pain versus resistance, Extension: 100 Deg, Abduction: 45 Deg, Adduction: 45 Deg Strength IR: 5/5, ER: 5/5, Flexion: 5/5, Extension: 5/5, Abduction: 3/5, Adduction: 4/5 Pelvic alignment unremarkable to inspection and palpation. Standing hip rotation and gait without trendelenburg sign / unsteadiness. Mild pain over the greater trochanteric  bursa Positive pain with internal rotation mild pain with Corky Sox No SI joint tenderness and normal minimal SI movement. Contralateral hip unremarkable.  MSK US performed of: Left This study was ordered, performed, and interpreted by Charlann Boxer D.O.  Hip: Trochanteric bursa with minimal swelling Acetabular labrum visualized with mild degenerative changes. Patient's hip joint does have moderate narrowing noted. No significant spurring noted.  Patient's hip  flexor tendon does have hypoechoic changes but no true tear appreciated. Femoral neck appears unremarkable without increased power doppler signal along Cortex.  IMPRESSION:  Hip flexor tendinitis with moderate osteophytic changes  Procedure note 19509; 15 minutes spent for Therapeutic exercises as stated in above notes.  This included exercises focusing on stretching, strengthening, with significant focus on eccentric aspects. Hip strengthening exercises which included:  Pelvic tilt/bracing to help with proper recruitment of the lower abs and pelvic floor muscles  Glute strengthening to properly contract glutes without over-engaging low back and hamstrings - prone hip extension and glute bridge exercises Proper stretching techniques to increase effectiveness for the hip flexors, groin, quads, piriformis and low back when appropriate   Proper technique shown and discussed handout in great detail with ATC.  All questions were discussed and answered.      Impression and Recommendations:     This case required medical decision making of moderate complexity.

## 2015-06-19 ENCOUNTER — Encounter: Payer: Self-pay | Admitting: Family Medicine

## 2015-06-19 ENCOUNTER — Ambulatory Visit (INDEPENDENT_AMBULATORY_CARE_PROVIDER_SITE_OTHER): Payer: Commercial Managed Care - PPO | Admitting: Family Medicine

## 2015-06-19 VITALS — BP 120/82 | HR 74 | Ht 66.0 in | Wt 198.0 lb

## 2015-06-19 DIAGNOSIS — M1990 Unspecified osteoarthritis, unspecified site: Secondary | ICD-10-CM | POA: Diagnosis not present

## 2015-06-19 DIAGNOSIS — M1612 Unilateral primary osteoarthritis, left hip: Secondary | ICD-10-CM | POA: Insufficient documentation

## 2015-06-19 NOTE — Assessment & Plan Note (Signed)
Unfortunately believe that patient's pain is now secondary to more of the underlying arthritis. Patient has decreased range of motion internally as well as worsening pain with internal rotation. Patient is having a mild antalgic gait. We discussed the possibility of injection which patient declined today. Patient will continue the other conservative measures at this time. Patient will try Tylenol on a regular basis. We discussed doing the exercises on a more regular basis as well. Patient declined formal physical therapy. Encourage weight loss. Patient come back and see me again in 4-6 weeks for further evaluation and treatment. We will consider injection at that time with patient having a trip to the beach coming soon.

## 2015-06-19 NOTE — Progress Notes (Signed)
[name] [place], [place] [address] Phone: [phone] Subjective:     CC: Left hip pain follow-up  [id number] [name] is a 59 y.o. female coming in with complaint of  Left hip pain. Patient was previously seen 3 weeks ago and was diagnosed with more of a hip flexor tendinitis as well as some underlying moderate osteophytic changes of the hip. Patient was given home exercises, icing protocol, trial topical anti-inflammatory's we discussed natural supplementations. Patient states she is only made some mild improvement. Patient has only been doing the exercises intermittently and has not had any significant improvement she would state. States that it seems to be worsening actually with activity. Not stopping her from anything except for dancing at this time. Patient states that she's been able sleep comfortable he. Patient has not taken any pain medication on a regular basis but is doing the natural supplementations. Denies new symptoms such as radiation down the leg or numbness.  Past Medical History  Diagnosis Date  . Thyroid disease   . Palpitation   . Obesity   . HLD (hyperlipidemia)    Past Surgical History  Procedure Laterality Date  . Shoulder surgery Left 2007  . Nose surgery  1980  . Tonsillectomy  1964  . Bladder stem stretch  1966   History  Substance Use Topics  . Smoking status: Never Smoker   . Smokeless tobacco: Not on file  . Alcohol Use: No   No Known Allergies Family History  Problem Relation Age of Onset  . Heart Problems Father     cardiac arrest  . Heart attack Mother   . Heart disease Mother        Past medical history, social, surgical and family history all reviewed in electronic medical record.   Review of Systems: No headache, visual changes, nausea, vomiting, diarrhea, constipation, dizziness, abdominal pain, skin rash, fevers, chills, night sweats, weight loss, swollen lymph nodes, body  aches, joint swelling, muscle aches, chest pain, shortness of breath, mood changes.   Objective Blood pressure 120/82, pulse 74, height 5\' 6"  (1.676 m), weight 198 lb (89.812 kg), SpO2 99 %.  General: No apparent distress alert and oriented x3 mood and affect normal, dressed appropriately.  HEENT: Pupils equal, extraocular movements intact  Respiratory: Patient's speak in full sentences and does not appear short of breath  Cardiovascular: No lower extremity edema, non tender, no erythema  Skin: Warm dry intact with no signs of infection or rash on extremities or on axial skeleton.  Abdomen: Soft nontender  Neuro: Cranial nerves II through XII are intact, neurovascularly intact in all extremities with 2+ DTRs and 2+ pulses.  Lymph: No lymphadenopathy of posterior or anterior cervical chain or axillae bilaterally.  Gait normal with good balance and coordination.  MSK:  Non tender with full range of motion and good stability and symmetric strength and tone of shoulders, elbows, wrist,  knee and ankles bilaterally.  Hip: Left ROM IR: 10 Deg with moderate   severe pain with internal rotation is worse than previous exam., ER: 45 Deg, Flexion: 100 Deg with pain versus resistance, Extension: 100 Deg, Abduction: 45 Deg, Adduction: 45 Deg Strength IR: 5/5, ER: 5/5, Flexion: 5/5, Extension: 5/5, Abduction: 3/5, Adduction: 4/5 Pelvic alignment unremarkable to inspection and palpation. Standing hip rotation and gait without trendelenburg sign / unsteadiness. Less pain over the greater trochanter bursa than previous exam. Positive pain with internal rotation mild pain with Corky Sox No  SI joint tenderness and normal minimal SI movement. Contralateral hip unremarkable.     Impression and Recommendations:     This case required medical decision making of moderate complexity.

## 2015-06-19 NOTE — Patient Instructions (Signed)
It is so good to see you Ice is your friend Conitnue the vitamins for sure and would consider the tylenol 500mg  3 times daily for the underlying arthritis.  Try to the exercises more regularly See me again in 6 weeks.

## 2015-06-19 NOTE — Progress Notes (Signed)
Pre visit review using our clinic review tool, if applicable. No additional management support is needed unless otherwise documented below in the visit note. 

## 2015-08-06 ENCOUNTER — Encounter: Payer: Self-pay | Admitting: Family Medicine

## 2015-08-06 ENCOUNTER — Other Ambulatory Visit (INDEPENDENT_AMBULATORY_CARE_PROVIDER_SITE_OTHER): Payer: Commercial Managed Care - PPO

## 2015-08-06 ENCOUNTER — Ambulatory Visit (INDEPENDENT_AMBULATORY_CARE_PROVIDER_SITE_OTHER): Payer: Commercial Managed Care - PPO | Admitting: Family Medicine

## 2015-08-06 VITALS — BP 134/82 | HR 75 | Ht 66.0 in | Wt 205.0 lb

## 2015-08-06 DIAGNOSIS — M1990 Unspecified osteoarthritis, unspecified site: Secondary | ICD-10-CM | POA: Diagnosis not present

## 2015-08-06 DIAGNOSIS — M25552 Pain in left hip: Secondary | ICD-10-CM

## 2015-08-06 DIAGNOSIS — M1612 Unilateral primary osteoarthritis, left hip: Secondary | ICD-10-CM

## 2015-08-06 NOTE — Assessment & Plan Note (Addendum)
Patient was given an injection today and tolerated the procedure well. We discussed icing regimen and home exercises. We discussed which activities to avoid over the course the next 72 hours. Longus patient responds fairly well to this we will have her come back and see me again with repeating the injection every 3-4 months if necessary. We discussed with patient at some point she will need to consider surgical intervention. Patient was with daughter and all questions answered. Spent  25 minutes with patient face-to-face and had greater than 50% of counseling including as described above in assessment and plan.

## 2015-08-06 NOTE — Progress Notes (Signed)
Pre visit review using our clinic review tool, if applicable. No additional management support is needed unless otherwise documented below in the visit note. 

## 2015-08-06 NOTE — Progress Notes (Signed)
[name] [place], [place] [address] Phone: [phone] Subjective:    CC: Left hip pain follow-up  BJY:[id number] [name] is a 59 y.o. female coming in with complaint of  Left hip pain. Patient was seen previously and was diagnosed with a hip flexor tendinitis but x-rays also showed underlying moderate osteophytic changes. We discussed conservative therapy including home exercises, icing protocol, as well as over-the-counter natural supplementations. Patient states overall she is been doing relatively well. Patient states this seems to be stable. Been doing the exercises 3-5 times a week. Has been icing as well as taking the Tylenol. Not stopping her from any activities but she does notice it. Patient walked yesterday on cement all day and states that she had severe pain as well as difficulty with ambulation. Denies nighttime awakening.  Past Medical History  Diagnosis Date  . Thyroid disease   . Palpitation   . Obesity   . HLD (hyperlipidemia)    Past Surgical History  Procedure Laterality Date  . Shoulder surgery Left 2007  . Nose surgery  1980  . Tonsillectomy  1964  . Bladder stem stretch  1966   Social History  Substance Use Topics  . Smoking status: Never Smoker   . Smokeless tobacco: None  . Alcohol Use: No   No Known Allergies Family History  Problem Relation Age of Onset  . Heart Problems Father     cardiac arrest  . Heart attack Mother   . Heart disease Mother        Past medical history, social, surgical and family history all reviewed in electronic medical record.   Review of Systems: No headache, visual changes, nausea, vomiting, diarrhea, constipation, dizziness, abdominal pain, skin rash, fevers, chills, night sweats, weight loss, swollen lymph nodes, body aches, joint swelling, muscle aches, chest pain, shortness of breath, mood changes.   Objective Blood pressure 134/82, pulse 75, height 5\' 6"  (1.676  m), weight 205 lb (92.987 kg), SpO2 98 %.  General: No apparent distress alert and oriented x3 mood and affect normal, dressed appropriately.  HEENT: Pupils equal, extraocular movements intact  Respiratory: Patient's speak in full sentences and does not appear short of breath  Cardiovascular: No lower extremity edema, non tender, no erythema  Skin: Warm dry intact with no signs of infection or rash on extremities or on axial skeleton.  Abdomen: Soft nontender  Neuro: Cranial nerves II through XII are intact, neurovascularly intact in all extremities with 2+ DTRs and 2+ pulses.  Lymph: No lymphadenopathy of posterior or anterior cervical chain or axillae bilaterally.  Gait normal with good balance and coordination.  MSK:  Non tender with full range of motion and good stability and symmetric strength and tone of shoulders, elbows, wrist,  knee and ankles bilaterally.  Hip: Left ROM IR: 10 Deg with moderate   severe pain with internal rotation stable from previous exam., ER: 45 Deg, Flexion: 100 Deg with pain versus resistance, Extension: 100 Deg, Abduction: 45 Deg, Adduction: 45 Deg Strength IR: 5/5, ER: 5/5, Flexion: 5/5, Extension: 5/5, Abduction: 3/5, Adduction: 4/5 Pelvic alignment unremarkable to inspection and palpation. Standing hip rotation and gait without trendelenburg sign / unsteadiness. Less pain over the greater trochanter bursa than previous exam. Positive pain with internal rotation mild pain with Corky Sox No SI joint tenderness and normal minimal SI movement. Contralateral hip unremarkable.  Procedure: Real-time Ultrasound Guided Injection of left intra-articular hip Device: GE Logiq E  Ultrasound guided  injection is preferred based studies that show increased duration, increased effect, greater accuracy, decreased procedural pain, increased response rate with ultrasound guided versus blind injection.  Verbal informed consent obtained.  Time-out conducted.  Noted no overlying  erythema, induration, or other signs of local infection.  Skin prepped in a sterile fashion.  Local anesthesia: Topical Ethyl chloride.  With sterile technique and under real time ultrasound guidance:  Anterior capsule visualized, needle visualized going to the head neck junction at the anterior capsule. Pictures taken. Patient did have injection of 3 cc of 1% lidocaine, 3 cc of 0.5% Marcaine, and 1 cc of Kenalog 40 mg/dL. Completed without difficulty  Pain immediately resolved suggesting accurate placement of the medication.  Advised to call if fevers/chills, erythema, induration, drainage, or persistent bleeding.  Images permanently stored and available for review in the ultrasound unit.  Impression: Technically successful ultrasound guided injection.      Impression and Recommendations:     This case required medical decision making of moderate complexity.

## 2015-08-06 NOTE — Patient Instructions (Signed)
Good to see you Ice is your friend in 6 hours Take today easy and start exercises again in 3 days Continue the vitamins See me again in 4 weeks.

## 2015-09-03 ENCOUNTER — Ambulatory Visit: Payer: Commercial Managed Care - PPO | Admitting: Internal Medicine

## 2015-09-17 ENCOUNTER — Ambulatory Visit: Payer: Commercial Managed Care - PPO | Admitting: Family Medicine

## 2015-10-01 ENCOUNTER — Encounter: Payer: Self-pay | Admitting: Family Medicine

## 2015-10-01 ENCOUNTER — Ambulatory Visit (INDEPENDENT_AMBULATORY_CARE_PROVIDER_SITE_OTHER): Payer: Commercial Managed Care - PPO | Admitting: Family Medicine

## 2015-10-01 VITALS — BP 138/88 | HR 72 | Ht 66.0 in | Wt 207.0 lb

## 2015-10-01 DIAGNOSIS — M1612 Unilateral primary osteoarthritis, left hip: Secondary | ICD-10-CM

## 2015-10-01 DIAGNOSIS — M1990 Unspecified osteoarthritis, unspecified site: Secondary | ICD-10-CM | POA: Diagnosis not present

## 2015-10-01 NOTE — Progress Notes (Signed)
Pre visit review using our clinic review tool, if applicable. No additional management support is needed unless otherwise documented below in the visit note. 

## 2015-10-01 NOTE — Progress Notes (Signed)
[name] [place], [place] [address] Phone: [phone] Subjective:    CC: Left hip pain follow-up  BJY:NWGNFAOZHY [name] is a 59 y.o. female coming in with complaint of  Left hip pain. Patient was seen previously and was diagnosed with a hip flexor tendinitis but x-rays also showed underlying moderate osteophytic changes. Patient was seen 2 months ago and was given an intra-articular injection in the hip. Patient was to continue with conservative therapy otherwise. Patient states the injection did help for proximally in 3 weeks and now the pain seems to be as bad if not worsening. States that she cannot walk greater than 300 feet without having to stop secondary to the discomfort. Patient has stopped things such as dancing or even standing in church secondary to the pain.  Past Medical History  Diagnosis Date  . Thyroid disease   . Palpitation   . Obesity   . HLD (hyperlipidemia)    Past Surgical History  Procedure Laterality Date  . Shoulder surgery Left 2007  . Nose surgery  1980  . Tonsillectomy  1964  . Bladder stem stretch  1966   Social History  Substance Use Topics  . Smoking status: Never Smoker   . Smokeless tobacco: None  . Alcohol Use: No   No Known Allergies Family History  Problem Relation Age of Onset  . Heart Problems Father     cardiac arrest  . Heart attack Mother   . Heart disease Mother        Past medical history, social, surgical and family history all reviewed in electronic medical record.   Review of Systems: No headache, visual changes, nausea, vomiting, diarrhea, constipation, dizziness, abdominal pain, skin rash, fevers, chills, night sweats, weight loss, swollen lymph nodes, body aches, joint swelling, muscle aches, chest pain, shortness of breath, mood changes.   Objective Blood pressure 138/88, pulse 72, height 5\' 6"  (1.676 m), weight 207 lb (93.895 kg), SpO2 98 %.  General: No apparent  distress alert and oriented x3 mood and affect normal, dressed appropriately.  HEENT: Pupils equal, extraocular movements intact  Respiratory: Patient's speak in full sentences and does not appear short of breath  Cardiovascular: No lower extremity edema, non tender, no erythema  Skin: Warm dry intact with no signs of infection or rash on extremities or on axial skeleton.  Abdomen: Soft nontender  Neuro: Cranial nerves II through XII are intact, neurovascularly intact in all extremities with 2+ DTRs and 2+ pulses.  Lymph: No lymphadenopathy of posterior or anterior cervical chain or axillae bilaterally.  Gait antalgic gait which is new.  MSK:  Non tender with full range of motion and good stability and symmetric strength and tone of shoulders, elbows, wrist,  knee and ankles bilaterally.  Hip: Left ROM IR: 10 Deg with moderate   severe pain with internal rotation worse than previous exam, ER: 45 Deg, Flexion: 100 Deg with pain versus resistance, Extension: 100 Deg, Abduction: 45 Deg, Adduction: 45 Deg Strength IR: 5/5, ER: 5/5, Flexion: 5/5, Extension: 5/5, Abduction: 3/5, Adduction: 4/5 Pelvic alignment unremarkable to inspection and palpation. Standing hip rotation and gait without trendelenburg sign / unsteadiness. Less pain over the greater trochanter bursa than previous exam. Positive pain with internal rotation mild pain with Corky Sox No SI joint tenderness and normal minimal SI movement. Contralateral hip unremarkable.      Impression and Recommendations:     This case required medical decision making of moderate complexity.

## 2015-10-01 NOTE — Patient Instructions (Addendum)
Great to see you I am sorry for the news Lets get you to see Dr. Rhona Raider.  Tylenol 650mg  3 times daily scheduled.  Duexis 3 times daily for 3 days if in severe pain.  Can get you prescription if you need it Please call if you have questions.

## 2015-10-01 NOTE — Assessment & Plan Note (Signed)
Patient is failed conservative therapy at this time. We discussed the possibility of repeating the injection which patient declined. She would like something more curative. We discussed daily thing would be hip replacement. Patient will like to discuss with an orthopedic surgeon. Patient does have moderate to severe osteophytic changes of the hip on x-ray and did respond to the injection but then has worsened over the course of time. Patient can return if she has any questions or any other problems.  Spent  25 minutes with patient face-to-face and had greater than 50% of counseling including as described above in assessment and plan.

## 2015-10-04 ENCOUNTER — Other Ambulatory Visit: Payer: Self-pay | Admitting: *Deleted

## 2015-10-04 DIAGNOSIS — M1612 Unilateral primary osteoarthritis, left hip: Secondary | ICD-10-CM

## 2015-10-09 ENCOUNTER — Other Ambulatory Visit: Payer: Self-pay | Admitting: Orthopaedic Surgery

## 2015-10-16 ENCOUNTER — Ambulatory Visit (HOSPITAL_COMMUNITY)
Admission: RE | Admit: 2015-10-16 | Discharge: 2015-10-16 | Disposition: A | Discharge status: Home / Self Care | Payer: Commercial Managed Care - PPO | Source: Ambulatory Visit | Attending: Orthopaedic Surgery | Admitting: Orthopaedic Surgery

## 2015-10-16 ENCOUNTER — Encounter (HOSPITAL_COMMUNITY)
Admission: RE | Admit: 2015-10-16 | Discharge: 2015-10-16 | Disposition: A | Discharge status: Home / Self Care | Payer: Commercial Managed Care - PPO | Source: Ambulatory Visit | Attending: Orthopaedic Surgery | Admitting: Orthopaedic Surgery

## 2015-10-16 ENCOUNTER — Encounter (HOSPITAL_COMMUNITY): Payer: Self-pay

## 2015-10-16 DIAGNOSIS — Z0183 Encounter for blood typing: Secondary | ICD-10-CM | POA: Insufficient documentation

## 2015-10-16 DIAGNOSIS — Z01818 Encounter for other preprocedural examination: Secondary | ICD-10-CM | POA: Diagnosis not present

## 2015-10-16 DIAGNOSIS — M1612 Unilateral primary osteoarthritis, left hip: Secondary | ICD-10-CM | POA: Insufficient documentation

## 2015-10-16 DIAGNOSIS — Z01812 Encounter for preprocedural laboratory examination: Secondary | ICD-10-CM | POA: Diagnosis not present

## 2015-10-16 HISTORY — DX: Hypothyroidism, unspecified: E03.9

## 2015-10-16 HISTORY — DX: Unspecified osteoarthritis, unspecified site: M19.90

## 2015-10-16 HISTORY — DX: Overactive bladder: N32.81

## 2015-10-16 LAB — CBC WITH DIFFERENTIAL/PLATELET
BASOS ABS: 0 10*3/uL (ref 0.0–0.1)
Basophils Relative: 0 %
EOS ABS: 0.3 10*3/uL (ref 0.0–0.7)
EOS PCT: 3 %
HCT: 41.9 % (ref 36.0–46.0)
HEMOGLOBIN: 14.1 g/dL (ref 12.0–15.0)
LYMPHS ABS: 3.3 10*3/uL (ref 0.7–4.0)
Lymphocytes Relative: 32 %
MCH: 29.7 pg (ref 26.0–34.0)
MCHC: 33.7 g/dL (ref 30.0–36.0)
MCV: 88.2 fL (ref 78.0–100.0)
Monocytes Absolute: 0.9 10*3/uL (ref 0.1–1.0)
Monocytes Relative: 9 %
NEUTROS PCT: 56 %
Neutro Abs: 5.8 10*3/uL (ref 1.7–7.7)
PLATELETS: 339 10*3/uL (ref 150–400)
RBC: 4.75 MIL/uL (ref 3.87–5.11)
RDW: 13.5 % (ref 11.5–15.5)
WBC: 10.3 10*3/uL (ref 4.0–10.5)

## 2015-10-16 LAB — URINALYSIS, ROUTINE W REFLEX MICROSCOPIC
BILIRUBIN URINE: NEGATIVE
GLUCOSE, UA: NEGATIVE mg/dL
Ketones, ur: NEGATIVE mg/dL
Nitrite: NEGATIVE
PH: 6 (ref 5.0–8.0)
Protein, ur: NEGATIVE mg/dL
Specific Gravity, Urine: 1.01 (ref 1.005–1.030)
UROBILINOGEN UA: 0.2 mg/dL (ref 0.0–1.0)

## 2015-10-16 LAB — BASIC METABOLIC PANEL
Anion gap: 11 (ref 5–15)
BUN: 7 mg/dL (ref 6–20)
CHLORIDE: 101 mmol/L (ref 101–111)
CO2: 28 mmol/L (ref 22–32)
CREATININE: 0.89 mg/dL (ref 0.44–1.00)
Calcium: 9.9 mg/dL (ref 8.9–10.3)
GFR calc Af Amer: >60 mL/min (ref >60)
GFR calc non Af Amer: >60 mL/min (ref >60)
GLUCOSE: 91 mg/dL (ref 65–99)
POTASSIUM: 3.1 mmol/L — AB (ref 3.5–5.1)
SODIUM: 140 mmol/L (ref 135–145)

## 2015-10-16 LAB — ABO/RH: ABO/RH(D): O NEG

## 2015-10-16 LAB — SURGICAL PCR SCREEN
MRSA, PCR: NEGATIVE
Staphylococcus aureus: POSITIVE — AB

## 2015-10-16 LAB — TYPE AND SCREEN
ABO/RH(D): O NEG
ANTIBODY SCREEN: NEGATIVE

## 2015-10-16 LAB — APTT: aPTT: 29 seconds (ref 24–37)

## 2015-10-16 LAB — PROTIME-INR
INR: 0.99 (ref 0.00–1.49)
PROTHROMBIN TIME: 13.3 s (ref 11.6–15.2)

## 2015-10-16 LAB — URINE MICROSCOPIC-ADD ON

## 2015-10-16 NOTE — Pre-Procedure Instructions (Signed)
[name]  10/16/2015      [facility] [address], [place] [address] Phone: [phone] Fax: [phone]  [id number] [place], [place] [address] Phone: [phone] Fax: [phone]    Your procedure is scheduled on  Tuesday 10/23/15  Report to [facility] Admitting at 800 A.M.  Call this number if you have problems the morning of surgery:  [phone]   Remember:  Do not eat food or drink liquids after midnight.  Take these medicines the morning of surgery with A SIP OF WATER  TYLENOL, LEVOTHYROXINE, METOPROLOL (TOPROL), MYRBETRIQ, PANTOPRAZOLE (PROTONIX), (STOP FISH OIL TURMERIC)   Do not wear jewelry, make-up or nail polish.  Do not wear lotions, powders, or perfumes.  You may wear deodorant.  Do not shave 48 hours prior to surgery.  Men may shave face and neck.  Do not bring valuables to the hospital.  [facility] is not responsible for any belongings or valuables.  Contacts, dentures or bridgework may not be worn into surgery.  Leave your suitcase in the car.  After surgery it may be brought to your room.  For patients admitted to the hospital, discharge time will be determined by your treatment team.  Patients discharged the day of surgery will not be allowed to drive home.   Name and phone number of your driver:    Special instructions:  [facility] - Preparing for Surgery  Before surgery, you can play an important role.  Because skin is not sterile, your skin needs to be as free of germs as possible.  You can reduce the number of germs on you skin by washing with CHG (chlorahexidine gluconate) soap before surgery.  CHG is an antiseptic cleaner which kills germs and bonds with the skin to continue killing germs even after washing.  Please DO NOT use if you have an allergy to CHG or antibacterial soaps.   If your skin becomes reddened/irritated stop using the CHG and inform your nurse when you arrive at Short Stay.  Do not shave (including legs and underarms) for at least 48 hours prior to the first CHG shower.  You may shave your face.  Please follow these instructions carefully:   1.  Shower with CHG Soap the night before surgery and the                                morning of Surgery.  2.  If you choose to wash your hair, wash your hair first as usual with your       normal shampoo.  3.  After you shampoo, rinse your hair and body thoroughly to remove the                      Shampoo.  4.  Use CHG as you would any other liquid soap.  You can apply chg directly       to the skin and wash gently with scrungie or a clean washcloth.  5.  Apply the CHG Soap to your body ONLY FROM THE NECK DOWN.        Do not use on open wounds or open sores.  Avoid contact with your eyes,       ears, mouth and  genitals (private parts).  Wash genitals (private parts)       with your normal soap.  6.  Wash thoroughly, paying special attention to the area where your surgery        will be performed.  7.  Thoroughly rinse your body with warm water from the neck down.  8.  DO NOT shower/wash with your normal soap after using and rinsing off       the CHG Soap.  9.  Pat yourself dry with a clean towel.            10.  Wear clean pajamas.            11.  Place clean sheets on your bed the night of your first shower and do not        sleep with pets.  Day of Surgery  Do not apply any lotions/deoderants the morning of surgery.  Please wear clean clothes to the hospital/surgery center.    Please read over the following fact sheets that you were given. Pain Booklet, Coughing and Deep Breathing, Total Joint Packet, MRSA Information and Surgical Site Infection Prevention

## 2015-10-22 MED ORDER — CHLORHEXIDINE GLUCONATE 4 % EX LIQD: 60.0000 mL | Freq: Once | CUTANEOUS | Status: DC

## 2015-10-22 MED ORDER — CEFAZOLIN SODIUM-DEXTROSE 2-3 GM-% IV SOLR
2.0000 g | INTRAVENOUS | Status: AC
  Administered 2015-10-23: 2 g via INTRAVENOUS
  Filled 2015-10-22: qty 50

## 2015-10-22 MED ORDER — LACTATED RINGERS IV SOLN
INTRAVENOUS | Status: DC
  Administered 2015-10-23 (×2): via INTRAVENOUS

## 2015-10-22 NOTE — H&P (Signed)
TOTAL HIP ADMISSION H&P  Patient is admitted for left total hip arthroplasty.  Subjective:  Chief Complaint: left hip pain  HPI: [name], 59 y.o. female, has a history of pain and functional disability in the left hip(s) due to arthritis and patient has failed non-surgical conservative treatments for greater than 12 weeks to include NSAID's and/or analgesics, corticosteriod injections, flexibility and strengthening excercises, use of assistive devices, weight reduction as appropriate and activity modification.  Onset of symptoms was gradual starting 5 years ago with gradually worsening course since that time.The patient noted no past surgery on the left hip(s).  Patient currently rates pain in the left hip at 10 out of 10 with activity. Patient has night pain, worsening of pain with activity and weight bearing, trendelenberg gait, pain that interfers with activities of daily living and crepitus. Patient has evidence of subchondral cysts, subchondral sclerosis, periarticular osteophytes and joint space narrowing by imaging studies. This condition presents safety issues increasing the risk of falls. There is no current active infection.  Patient Active Problem List   Diagnosis Date Noted  . Arthritis of left hip 06/19/2015  . Hip flexor tendinitis 05/29/2015  . Essential hypertension 05/11/2015  . Hypothyroidism 05/11/2015  . Routine general medical examination at a health care facility 05/11/2015  . Obesity 05/11/2015  . Palpitations 01/03/2014  . Chest pain 01/03/2014  . Hyperlipidemia 01/03/2014   Past Medical History  Diagnosis Date  . Thyroid disease   . Palpitation   . Obesity   . HLD (hyperlipidemia)   . Hypothyroidism   . Arthritis   . Overactive bladder     Past Surgical History  Procedure Laterality Date  . Shoulder surgery Left 2007  . Nose surgery  1980  . Tonsillectomy  1964  . Bladder stem stretch  1966  . Knee arthroscopy      LEFT    No prescriptions prior  to admission   No Known Allergies  Social History  Substance Use Topics  . Smoking status: Never Smoker   . Smokeless tobacco: Not on file  . Alcohol Use: No    Family History  Problem Relation Age of Onset  . Heart Problems Father     cardiac arrest  . Heart attack Mother   . Heart disease Mother      Review of Systems  Musculoskeletal: Positive for joint pain.       Left hip  All other systems reviewed and are negative.   Objective:  Physical Exam  Constitutional: She is oriented to person, place, and time. She appears well-developed and well-nourished.  HENT:  Head: Normocephalic and atraumatic.  Eyes: Pupils are equal, round, and reactive to light.  Neck: Normal range of motion.  Cardiovascular: Normal rate and regular rhythm.   Respiratory: Effort normal.  GI: Soft.  Musculoskeletal:  Left hip motion is limited and quite painful especially in internal rotation. Her leg lengths are equal. Sensation and motor function are intact in her feet. She has palpable pulses on both sides. Lumbosacral exam is notable for some pain to palpation about the low back but her skin is benign and her motion is nearly full.   Neurological: She is alert and oriented to person, place, and time.  Skin: Skin is warm and dry.  Psychiatric: She has a normal mood and affect. Her behavior is normal. Judgment and thought content normal.    Vital signs in last 24 hours:    Labs:   Estimated body mass index  is 33.43 kg/(m^2) as calculated from the following:   Height as of 10/01/15: 5\' 6"  (1.676 m).   Weight as of 10/01/15: 93.895 kg (207 lb).   Imaging Review Plain radiographs demonstrate severe degenerative joint disease of the left hip(s). The bone quality appears to be good for age and reported activity level.  Assessment/Plan:  End stage primary arthritis, left hip(s)  The patient history, physical examination, clinical judgement of the provider and imaging studies are consistent  with end stage degenerative joint disease of the left hip(s) and total hip arthroplasty is deemed medically necessary. The treatment options including medical management, injection therapy, arthroscopy and arthroplasty were discussed at length. The risks and benefits of total hip arthroplasty were presented and reviewed. The risks due to aseptic loosening, infection, stiffness, dislocation/subluxation,  thromboembolic complications and other imponderables were discussed.  The patient acknowledged the explanation, agreed to proceed with the plan and consent was signed. Patient is being admitted for inpatient treatment for surgery, pain control, PT, OT, prophylactic antibiotics, VTE prophylaxis, progressive ambulation and ADL's and discharge planning.The patient is planning to be discharged home with home health services

## 2015-10-23 ENCOUNTER — Inpatient Hospital Stay (HOSPITAL_COMMUNITY): Payer: Commercial Managed Care - PPO | Admitting: Anesthesiology

## 2015-10-23 ENCOUNTER — Encounter (HOSPITAL_COMMUNITY)
Admission: AD | Disposition: A | Discharge status: Home Health | Payer: Self-pay | Source: Ambulatory Visit | Attending: Orthopaedic Surgery

## 2015-10-23 ENCOUNTER — Inpatient Hospital Stay (HOSPITAL_COMMUNITY): Payer: Commercial Managed Care - PPO

## 2015-10-23 ENCOUNTER — Inpatient Hospital Stay (HOSPITAL_COMMUNITY)
Admission: AD | Admit: 2015-10-23 | Discharge: 2015-10-25 | DRG: 470 | Disposition: A | Discharge status: Home Health | Payer: Commercial Managed Care - PPO | Source: Ambulatory Visit | Attending: Orthopaedic Surgery | Admitting: Orthopaedic Surgery

## 2015-10-23 ENCOUNTER — Encounter (HOSPITAL_COMMUNITY): Payer: Self-pay | Admitting: *Deleted

## 2015-10-23 DIAGNOSIS — E785 Hyperlipidemia, unspecified: Secondary | ICD-10-CM | POA: Diagnosis present

## 2015-10-23 DIAGNOSIS — Z8249 Family history of ischemic heart disease and other diseases of the circulatory system: Secondary | ICD-10-CM

## 2015-10-23 DIAGNOSIS — E039 Hypothyroidism, unspecified: Secondary | ICD-10-CM | POA: Diagnosis present

## 2015-10-23 DIAGNOSIS — E669 Obesity, unspecified: Secondary | ICD-10-CM | POA: Diagnosis present

## 2015-10-23 DIAGNOSIS — Z6832 Body mass index (BMI) 32.0-32.9, adult: Secondary | ICD-10-CM | POA: Diagnosis not present

## 2015-10-23 DIAGNOSIS — Z419 Encounter for procedure for purposes other than remedying health state, unspecified: Secondary | ICD-10-CM

## 2015-10-23 DIAGNOSIS — I10 Essential (primary) hypertension: Secondary | ICD-10-CM | POA: Diagnosis present

## 2015-10-23 DIAGNOSIS — M1612 Unilateral primary osteoarthritis, left hip: Secondary | ICD-10-CM | POA: Diagnosis present

## 2015-10-23 DIAGNOSIS — N3281 Overactive bladder: Secondary | ICD-10-CM | POA: Diagnosis present

## 2015-10-23 DIAGNOSIS — M25552 Pain in left hip: Secondary | ICD-10-CM | POA: Diagnosis present

## 2015-10-23 DIAGNOSIS — K219 Gastro-esophageal reflux disease without esophagitis: Secondary | ICD-10-CM | POA: Diagnosis present

## 2015-10-23 HISTORY — PX: TOTAL HIP ARTHROPLASTY: SHX124

## 2015-10-23 SURGERY — ARTHROPLASTY, HIP, TOTAL, ANTERIOR APPROACH
Anesthesia: Monitor Anesthesia Care | Site: Hip | Laterality: Left

## 2015-10-23 MED ORDER — FENTANYL CITRATE (PF) 100 MCG/2ML IJ SOLN
INTRAMUSCULAR | Status: AC
  Filled 2015-10-23: qty 2

## 2015-10-23 MED ORDER — PANTOPRAZOLE SODIUM 40 MG PO TBEC
40.0000 mg | DELAYED_RELEASE_TABLET | Freq: Every day | ORAL | Status: DC
  Administered 2015-10-24 – 2015-10-25 (×2): 40 mg via ORAL
  Filled 2015-10-23 (×2): qty 1

## 2015-10-23 MED ORDER — ACETAMINOPHEN 325 MG PO TABS
650.0000 mg | ORAL_TABLET | Freq: Four times a day (QID) | ORAL | Status: DC | PRN
  Filled 2015-10-23: qty 2

## 2015-10-23 MED ORDER — CEFAZOLIN SODIUM-DEXTROSE 2-3 GM-% IV SOLR
INTRAVENOUS | Status: AC
  Filled 2015-10-23: qty 50

## 2015-10-23 MED ORDER — CEFAZOLIN SODIUM-DEXTROSE 2-3 GM-% IV SOLR
2.0000 g | Freq: Four times a day (QID) | INTRAVENOUS | Status: AC
  Administered 2015-10-23 (×2): 2 g via INTRAVENOUS
  Filled 2015-10-23 (×2): qty 50

## 2015-10-23 MED ORDER — FENTANYL CITRATE (PF) 100 MCG/2ML IJ SOLN
INTRAMUSCULAR | Status: DC | PRN
  Administered 2015-10-23: 50 ug via INTRAVENOUS

## 2015-10-23 MED ORDER — HYDROMORPHONE HCL 1 MG/ML IJ SOLN
0.5000 mg | INTRAMUSCULAR | Status: DC | PRN
  Administered 2015-10-23: 1 mg via INTRAVENOUS
  Filled 2015-10-23: qty 1

## 2015-10-23 MED ORDER — METOCLOPRAMIDE HCL 5 MG/ML IJ SOLN: 5.0000 mg | Freq: Three times a day (TID) | INTRAMUSCULAR | Status: DC | PRN

## 2015-10-23 MED ORDER — BISACODYL 5 MG PO TBEC: 5.0000 mg | DELAYED_RELEASE_TABLET | Freq: Every day | ORAL | Status: DC | PRN

## 2015-10-23 MED ORDER — PROPOFOL 500 MG/50ML IV EMUL
INTRAVENOUS | Status: DC | PRN
  Administered 2015-10-23: 50 ug/kg/min via INTRAVENOUS

## 2015-10-23 MED ORDER — PROMETHAZINE HCL 25 MG/ML IJ SOLN: 6.2500 mg | INTRAMUSCULAR | Status: DC | PRN

## 2015-10-23 MED ORDER — TRANEXAMIC ACID 1000 MG/10ML IV SOLN
1000.0000 mg | INTRAVENOUS | Status: AC
  Administered 2015-10-23: 1000 mg via INTRAVENOUS
  Filled 2015-10-23: qty 10

## 2015-10-23 MED ORDER — METHOCARBAMOL 500 MG PO TABS
500.0000 mg | ORAL_TABLET | Freq: Four times a day (QID) | ORAL | Status: DC | PRN
  Administered 2015-10-23 – 2015-10-25 (×4): 500 mg via ORAL
  Filled 2015-10-23 (×5): qty 1

## 2015-10-23 MED ORDER — MIDAZOLAM HCL 5 MG/5ML IJ SOLN
INTRAMUSCULAR | Status: DC | PRN
  Administered 2015-10-23: 2 mg via INTRAVENOUS

## 2015-10-23 MED ORDER — METOCLOPRAMIDE HCL 5 MG PO TABS: 5.0000 mg | ORAL_TABLET | Freq: Three times a day (TID) | ORAL | Status: DC | PRN

## 2015-10-23 MED ORDER — HYDROCODONE-ACETAMINOPHEN 5-325 MG PO TABS
ORAL_TABLET | ORAL | Status: AC
  Filled 2015-10-23: qty 2

## 2015-10-23 MED ORDER — FENTANYL CITRATE (PF) 100 MCG/2ML IJ SOLN
25.0000 ug | INTRAMUSCULAR | Status: DC | PRN
  Administered 2015-10-23 (×3): 50 ug via INTRAVENOUS

## 2015-10-23 MED ORDER — ONDANSETRON HCL 4 MG/2ML IJ SOLN
INTRAMUSCULAR | Status: DC | PRN
  Administered 2015-10-23: 4 mg via INTRAVENOUS

## 2015-10-23 MED ORDER — MIRABEGRON ER 50 MG PO TB24
50.0000 mg | ORAL_TABLET | Freq: Every day | ORAL | Status: DC
  Administered 2015-10-24 – 2015-10-25 (×2): 50 mg via ORAL
  Filled 2015-10-23 (×2): qty 1

## 2015-10-23 MED ORDER — SIMVASTATIN 20 MG PO TABS
20.0000 mg | ORAL_TABLET | Freq: Every day | ORAL | Status: DC
  Administered 2015-10-23 – 2015-10-24 (×2): 20 mg via ORAL
  Filled 2015-10-23 (×2): qty 1

## 2015-10-23 MED ORDER — PHENOL 1.4 % MT LIQD
1.0000 | OROMUCOSAL | Status: DC | PRN
Start: 2015-10-23 — End: 2015-10-25

## 2015-10-23 MED ORDER — METHOCARBAMOL 500 MG PO TABS
ORAL_TABLET | ORAL | Status: AC
  Filled 2015-10-23: qty 1

## 2015-10-23 MED ORDER — ONDANSETRON HCL 4 MG/2ML IJ SOLN: 4.0000 mg | Freq: Four times a day (QID) | INTRAMUSCULAR | Status: DC | PRN

## 2015-10-23 MED ORDER — ONDANSETRON HCL 4 MG PO TABS: 4.0000 mg | ORAL_TABLET | Freq: Four times a day (QID) | ORAL | Status: DC | PRN

## 2015-10-23 MED ORDER — ALUM & MAG HYDROXIDE-SIMETH 200-200-20 MG/5ML PO SUSP: 30.0000 mL | ORAL | Status: DC | PRN

## 2015-10-23 MED ORDER — LIDOCAINE HCL (CARDIAC) 20 MG/ML IV SOLN
INTRAVENOUS | Status: DC | PRN
  Administered 2015-10-23 (×2): 20 mg via INTRAVENOUS

## 2015-10-23 MED ORDER — HYDROCHLOROTHIAZIDE 25 MG PO TABS
25.0000 mg | ORAL_TABLET | Freq: Every day | ORAL | Status: DC
  Administered 2015-10-24 – 2015-10-25 (×2): 25 mg via ORAL
  Filled 2015-10-23 (×2): qty 1

## 2015-10-23 MED ORDER — DOCUSATE SODIUM 100 MG PO CAPS
100.0000 mg | ORAL_CAPSULE | Freq: Two times a day (BID) | ORAL | Status: DC
Start: 2015-10-23 — End: 2015-10-25
  Administered 2015-10-23 – 2015-10-25 (×4): 100 mg via ORAL
  Filled 2015-10-23 (×4): qty 1

## 2015-10-23 MED ORDER — ACETAMINOPHEN 650 MG RE SUPP
650.0000 mg | Freq: Four times a day (QID) | RECTAL | Status: DC | PRN
  Filled 2015-10-23: qty 1

## 2015-10-23 MED ORDER — LEVOTHYROXINE SODIUM 88 MCG PO TABS
88.0000 ug | ORAL_TABLET | Freq: Every day | ORAL | Status: DC
  Administered 2015-10-24 – 2015-10-25 (×2): 88 ug via ORAL
  Filled 2015-10-23 (×2): qty 1

## 2015-10-23 MED ORDER — ASPIRIN EC 325 MG PO TBEC
325.0000 mg | DELAYED_RELEASE_TABLET | Freq: Two times a day (BID) | ORAL | Status: DC
  Administered 2015-10-24 – 2015-10-25 (×3): 325 mg via ORAL
  Filled 2015-10-23 (×3): qty 1

## 2015-10-23 MED ORDER — BUPIVACAINE IN DEXTROSE 0.75-8.25 % IT SOLN
INTRATHECAL | Status: DC | PRN
  Administered 2015-10-23: 15 mg via INTRATHECAL

## 2015-10-23 MED ORDER — 0.9 % SODIUM CHLORIDE (POUR BTL) OPTIME
TOPICAL | Status: DC | PRN
  Administered 2015-10-23: 1000 mL

## 2015-10-23 MED ORDER — LACTATED RINGERS IV SOLN: INTRAVENOUS | Status: DC

## 2015-10-23 MED ORDER — AZELASTINE HCL 0.1 % NA SOLN
1.0000 | NASAL | Status: DC | PRN
  Filled 2015-10-23: qty 30

## 2015-10-23 MED ORDER — PHENYLEPHRINE HCL 10 MG/ML IJ SOLN
10.0000 mg | INTRAVENOUS | Status: DC | PRN
  Administered 2015-10-23: 20 ug/min via INTRAVENOUS

## 2015-10-23 MED ORDER — HYDROCODONE-ACETAMINOPHEN 5-325 MG PO TABS
1.0000 | ORAL_TABLET | ORAL | Status: DC | PRN
  Administered 2015-10-23 – 2015-10-25 (×8): 2 via ORAL
  Filled 2015-10-23 (×7): qty 2

## 2015-10-23 MED ORDER — FENTANYL CITRATE (PF) 250 MCG/5ML IJ SOLN
INTRAMUSCULAR | Status: AC
Start: 2015-10-23 — End: 2015-10-23
  Filled 2015-10-23: qty 5

## 2015-10-23 MED ORDER — LACTATED RINGERS IV SOLN
INTRAVENOUS | Status: DC
  Administered 2015-10-24: via INTRAVENOUS

## 2015-10-23 MED ORDER — MENTHOL 3 MG MT LOZG: 1.0000 | LOZENGE | OROMUCOSAL | Status: DC | PRN

## 2015-10-23 MED ORDER — METOPROLOL SUCCINATE ER 25 MG PO TB24
12.5000 mg | ORAL_TABLET | Freq: Every day | ORAL | Status: DC
  Administered 2015-10-24 – 2015-10-25 (×2): 12.5 mg via ORAL
  Filled 2015-10-23 (×2): qty 1

## 2015-10-23 MED ORDER — ONDANSETRON HCL 4 MG/2ML IJ SOLN
INTRAMUSCULAR | Status: AC
  Filled 2015-10-23: qty 2

## 2015-10-23 MED ORDER — DIPHENHYDRAMINE HCL 12.5 MG/5ML PO ELIX: 12.5000 mg | ORAL_SOLUTION | ORAL | Status: DC | PRN

## 2015-10-23 MED ORDER — METHOCARBAMOL 1000 MG/10ML IJ SOLN
500.0000 mg | Freq: Four times a day (QID) | INTRAVENOUS | Status: DC | PRN
  Filled 2015-10-23: qty 5

## 2015-10-23 MED ORDER — MIDAZOLAM HCL 2 MG/2ML IJ SOLN
INTRAMUSCULAR | Status: AC
  Filled 2015-10-23: qty 4

## 2015-10-23 SURGICAL SUPPLY — 55 items
APL SKNCLS STERI-STRIP NONHPOA (GAUZE/BANDAGES/DRESSINGS) ×1
BENZOIN TINCTURE PRP APPL 2/3 (GAUZE/BANDAGES/DRESSINGS) ×2 IMPLANT
BLADE SAW SGTL 18X1.27X75 (BLADE) ×2 IMPLANT
BLADE SAW SGTL 18X1.27X75MM (BLADE) ×1
BLADE SURG ROTATE 9660 (MISCELLANEOUS) IMPLANT
CAPT HIP TOTAL 2 ×2 IMPLANT
CELLS DAT CNTRL 66122 CELL SVR (MISCELLANEOUS) ×1 IMPLANT
CLOSURE STERI-STRIP 1/2X4 (GAUZE/BANDAGES/DRESSINGS) ×1
CLSR STERI-STRIP ANTIMIC 1/2X4 (GAUZE/BANDAGES/DRESSINGS) ×1 IMPLANT
COVER PERINEAL POST (MISCELLANEOUS) ×3 IMPLANT
COVER SURGICAL LIGHT HANDLE (MISCELLANEOUS) ×3 IMPLANT
DRAPE C-ARM 42X72 X-RAY (DRAPES) ×3 IMPLANT
DRAPE IMP U-DRAPE 54X76 (DRAPES) ×3 IMPLANT
DRAPE STERI IOBAN 125X83 (DRAPES) ×3 IMPLANT
DRAPE U-SHAPE 47X51 STRL (DRAPES) ×9 IMPLANT
DRSG AQUACEL AG ADV 3.5X10 (GAUZE/BANDAGES/DRESSINGS) ×3 IMPLANT
DRSG AQUACEL AG ADV 3.5X14 (GAUZE/BANDAGES/DRESSINGS) ×2 IMPLANT
DURAPREP 26ML APPLICATOR (WOUND CARE) ×3 IMPLANT
ELECT BLADE 4.0 EZ CLEAN MEGAD (MISCELLANEOUS)
ELECT CAUTERY BLADE 6.4 (BLADE) ×3 IMPLANT
ELECT REM PT RETURN 9FT ADLT (ELECTROSURGICAL) ×3
ELECTRODE BLDE 4.0 EZ CLN MEGD (MISCELLANEOUS) IMPLANT
ELECTRODE REM PT RTRN 9FT ADLT (ELECTROSURGICAL) ×1 IMPLANT
FACESHIELD WRAPAROUND (MASK) ×6 IMPLANT
FACESHIELD WRAPAROUND OR TEAM (MASK) ×2 IMPLANT
GLOVE BIO SURGEON STRL SZ8 (GLOVE) ×6 IMPLANT
GLOVE BIOGEL PI IND STRL 8 (GLOVE) ×2 IMPLANT
GLOVE BIOGEL PI INDICATOR 8 (GLOVE) ×4
GOWN STRL REUS W/ TWL LRG LVL3 (GOWN DISPOSABLE) ×1 IMPLANT
GOWN STRL REUS W/ TWL XL LVL3 (GOWN DISPOSABLE) ×2 IMPLANT
GOWN STRL REUS W/TWL LRG LVL3 (GOWN DISPOSABLE) ×3
GOWN STRL REUS W/TWL XL LVL3 (GOWN DISPOSABLE) ×6
KIT BASIN OR (CUSTOM PROCEDURE TRAY) ×3 IMPLANT
KIT ROOM TURNOVER OR (KITS) ×3 IMPLANT
LINER BOOT UNIVERSAL DISP (MISCELLANEOUS) ×3 IMPLANT
MANIFOLD NEPTUNE II (INSTRUMENTS) ×3 IMPLANT
NS IRRIG 1000ML POUR BTL (IV SOLUTION) ×3 IMPLANT
PACK TOTAL JOINT (CUSTOM PROCEDURE TRAY) ×3 IMPLANT
PACK UNIVERSAL I (CUSTOM PROCEDURE TRAY) ×3 IMPLANT
PAD ARMBOARD 7.5X6 YLW CONV (MISCELLANEOUS) ×6 IMPLANT
RETRACTOR WND ALEXIS 18 MED (MISCELLANEOUS) ×1 IMPLANT
RTRCTR WOUND ALEXIS 18CM MED (MISCELLANEOUS) ×3
STAPLER VISISTAT 35W (STAPLE) ×3 IMPLANT
SUT ETHIBOND NAB CT1 #1 30IN (SUTURE) ×9 IMPLANT
SUT VIC AB 0 CT1 27 (SUTURE)
SUT VIC AB 0 CT1 27XBRD ANBCTR (SUTURE) IMPLANT
SUT VIC AB 1 CT1 27 (SUTURE) ×3
SUT VIC AB 1 CT1 27XBRD ANBCTR (SUTURE) ×1 IMPLANT
SUT VIC AB 2-0 CT1 27 (SUTURE) ×3
SUT VIC AB 2-0 CT1 TAPERPNT 27 (SUTURE) ×1 IMPLANT
SUT VLOC 180 0 24IN GS25 (SUTURE) ×3 IMPLANT
TOWEL OR 17X24 6PK STRL BLUE (TOWEL DISPOSABLE) ×3 IMPLANT
TOWEL OR 17X26 10 PK STRL BLUE (TOWEL DISPOSABLE) ×6 IMPLANT
TRAY FOLEY CATH 14FR (SET/KITS/TRAYS/PACK) IMPLANT
WATER STERILE IRR 1000ML POUR (IV SOLUTION) ×6 IMPLANT

## 2015-10-23 NOTE — Anesthesia Postprocedure Evaluation (Signed)
  Anesthesia Post-op Note  Patient: [name]  Procedure(s) Performed: Procedure(s) (LRB): TOTAL HIP ARTHROPLASTY ANTERIOR APPROACH (Left)  Patient Location: PACU  Anesthesia Type: Spinal  Level of Consciousness: awake and alert   Airway and Oxygen Therapy: Patient Spontanous Breathing  Post-op Pain: mild  Post-op Assessment: Post-op Vital signs reviewed, Patient's Cardiovascular Status Stable, Respiratory Function Stable, Patent Airway and No signs of Nausea or vomiting; moving BLE  Last Vitals:  Filed Vitals:   10/23/15 1300  BP: 102/65  Pulse: 59  Temp:   Resp: 12    Post-op Vital Signs: stable   Complications: No apparent anesthesia complications

## 2015-10-23 NOTE — Interval H&P Note (Signed)
OK for surgery PD 

## 2015-10-23 NOTE — Anesthesia Preprocedure Evaluation (Addendum)
Anesthesia Evaluation  Patient identified by MRN, date of birth, ID band Patient awake    Reviewed: Allergy & Precautions, NPO status , Patient's Chart, lab work & pertinent test results, reviewed documented beta blocker date and time   History of Anesthesia Complications Negative for: history of anesthetic complications  Airway Mallampati: II  TM Distance: >3 FB Neck ROM: Full    Dental  (+) Teeth Intact, Dental Advisory Given   Pulmonary neg pulmonary ROS,    Pulmonary exam normal breath sounds clear to auscultation       Cardiovascular hypertension, Pt. on home beta blockers and Pt. on medications (-) angina(-) Past MI Normal cardiovascular exam Rhythm:Regular Rate:Normal     Neuro/Psych negative neurological ROS  negative psych ROS   GI/Hepatic Neg liver ROS, GERD  Medicated and Controlled,  Endo/Other  Hypothyroidism Obesity   Renal/GU negative Renal ROS     Musculoskeletal  (+) Arthritis , Osteoarthritis,    Abdominal   Peds  Hematology negative hematology ROS (+)   Anesthesia Other Findings Day of surgery medications reviewed with the patient.  Reproductive/Obstetrics                            Anesthesia Physical Anesthesia Plan  ASA: II  Anesthesia Plan: Spinal and MAC   Post-op Pain Management:    Induction: Intravenous  Airway Management Planned: Simple Face Mask  Additional Equipment:   Intra-op Plan:   Post-operative Plan:   Informed Consent: I have reviewed the patients History and Physical, chart, labs and discussed the procedure including the risks, benefits and alternatives for the proposed anesthesia with the patient or authorized representative who has indicated his/her understanding and acceptance.   Dental advisory given  Plan Discussed with: CRNA, Anesthesiologist and Surgeon  Anesthesia Plan Comments: (Discussed risks and benefits of and differences  between spinal and general. Discussed risks of spinal including headache, backache, failure, bleeding, infection, and nerve damage. Patient consents to spinal. Questions answered. Coagulation studies and platelet count acceptable.)        Anesthesia Quick Evaluation

## 2015-10-23 NOTE — Op Note (Signed)

## 2015-10-23 NOTE — Transfer of Care (Signed)
Immediate Anesthesia Transfer of Care Note  Patient: [name]  Procedure(s) Performed: Procedure(s): TOTAL HIP ARTHROPLASTY ANTERIOR APPROACH (Left)  Patient Location: PACU  Anesthesia Type:Spinal  Level of Consciousness: awake  Airway & Oxygen Therapy: Patient Spontanous Breathing  Post-op Assessment: Report given to RN and Post -op Vital signs reviewed and stable  Post vital signs: stable  Last Vitals:  Filed Vitals:   10/23/15 0807  BP: 132/68  Pulse: 78  Temp: 36.6 C  Resp: 16    Complications: No apparent anesthesia complications

## 2015-10-24 ENCOUNTER — Encounter (HOSPITAL_COMMUNITY): Payer: Self-pay | Admitting: Orthopaedic Surgery

## 2015-10-24 NOTE — Evaluation (Signed)
Physical Therapy Evaluation Patient Details Name: [name] MRN: [id number] DOB: [birth year] Today's Date: 10/24/2015   History of Present Illness  59 y.o. female admitted for L THA -direct anterior approach.   Clinical Impression  Pt admitted with above diagnosis. Pt currently with functional limitations due to the deficits listed below (see PT Problem List). Pt ambulated 71' with RW and min/guard assist. Performed THA exercises with min A. Good progress expected.  Pt will benefit from skilled PT to increase their independence and safety with mobility to allow discharge to the venue listed below.       Follow Up Recommendations Home health PT;Supervision for mobility/OOB    Equipment Recommendations  None recommended by PT    Recommendations for Other Services OT consult     Precautions / Restrictions Precautions Precautions: Fall Restrictions Weight Bearing Restrictions: Yes LLE Weight Bearing: Weight bearing as tolerated      Mobility  Bed Mobility Overal bed mobility: Needs Assistance Bed Mobility: Supine to Sit     Supine to sit: Supervision     General bed mobility comments: used sheet as leg lifter for LLE, no physical assist, verbal cues for technique  Transfers Overall transfer level: Needs assistance Equipment used: Rolling walker (2 wheeled) Transfers: Sit to/from Stand Sit to Stand: Min assist         General transfer comment: min A to rise, verbal cues for hand placement  Ambulation/Gait Ambulation/Gait assistance: Min guard Ambulation Distance (Feet): 60 Feet Assistive device: Rolling walker (2 wheeled) Gait Pattern/deviations: Step-to pattern;Decreased step length - left;Decreased step length - right;Decreased weight shift to left;Antalgic     General Gait Details: steady with walker, VCs to lift head, good sequencing, distance limited by onset of dizziness  Stairs            Wheelchair Mobility    Modified Rankin (Stroke Patients  Only)       Balance Overall balance assessment: Modified Independent                                           Pertinent Vitals/Pain Pain Assessment: 0-10 Pain Score: 4  Pain Location: L hip Pain Intervention(s): Limited activity within patient's tolerance;Monitored during session;Premedicated before session    Home Living Family/patient expects to be discharged to:: Private residence Living Arrangements: Other relatives;Children (adult son will stay with her til Sunday, pt lives with 21 y.o. daughter) Available Help at Discharge: Family;Available 24 hours/day Type of Home: House Home Access: Stairs to enter Entrance Stairs-Rails: Can reach both;Left;Right Entrance Stairs-Number of Steps: 7 Home Layout: One level Home Equipment: Walker - 2 wheels;Bedside commode;Shower seat      Prior Function Level of Independence: Independent         Comments: no h/o falls     Hand Dominance        Extremity/Trunk Assessment   Upper Extremity Assessment: Overall WFL for tasks assessed           Lower Extremity Assessment: LLE deficits/detail   LLE Deficits / Details: L hip ABD AAROM WFL, L hip flexion AAROM decr 50% limited by pain.  L knee extension 3/5.   Cervical / Trunk Assessment: Normal  Communication   Communication: No difficulties  Cognition Arousal/Alertness: Awake/alert Behavior During Therapy: WFL for tasks assessed/performed Overall Cognitive Status: Within Functional Limits for tasks assessed  General Comments      Exercises Total Joint Exercises Ankle Circles/Pumps: AROM;Both;10 reps;Supine Quad Sets: AROM;Left;5 reps;Supine Heel Slides: AAROM;Left;15 reps;Supine Hip ABduction/ADduction: AAROM;Left;15 reps;Supine Long Arc Quad: AROM;Left;10 reps;Seated      Assessment/Plan    PT Assessment Patient needs continued PT services  PT Diagnosis Difficulty walking;Acute pain   PT Problem List  Decreased strength;Decreased range of motion;Decreased activity tolerance;Pain;Decreased knowledge of use of DME;Decreased mobility  PT Treatment Interventions DME instruction;Gait training;Stair training;Functional mobility training;Therapeutic activities;Patient/family education;Therapeutic exercise   PT Goals (Current goals can be found in the Care Plan section) Acute Rehab PT Goals Patient Stated Goal: to be able to squat, to walk better PT Goal Formulation: With patient Time For Goal Achievement: 10/31/15 Potential to Achieve Goals: Good    Frequency 7X/week   Barriers to discharge        Co-evaluation               End of Session Equipment Utilized During Treatment: Gait belt Activity Tolerance: Patient tolerated treatment well Patient left: in chair;with call bell/phone within reach;with family/visitor present Nurse Communication: Mobility status         Time: 0277-4128 PT Time Calculation (min) (ACUTE ONLY): 41 min   Charges:   PT Evaluation $Initial PT Evaluation Tier I: 1 Procedure PT Treatments $Gait Training: 8-22 mins $Therapeutic Exercise: 8-22 mins   PT G Codes:        Philomena Doheny 10/24/2015, 11:53 AM [phone]

## 2015-10-24 NOTE — Evaluation (Signed)
Occupational Therapy Evaluation Patient Details Name: [name] MRN: [id number] DOB: 1956/12/07 Today's Date: 10/24/2015    History of Present Illness 59 y.o. female admitted for L THA -direct anterior approach.    Clinical Impression   Pt reports she was independent with ADLs PTA. Pt limited by pain this morning. Educated pt on compensatory strategies for LB ADLs, use of AE for increased independence with LB ADLs, edema management techniques; pt verbalized understanding. Plan to practice walk in shower transfer and use of AE next session. Recommending HHOT follow up to increase independence and safety with ADLs and functional mobility upon return home. Pt plan to d/c home with 24/7 supervision from her son until Sunday. Pt would benefit from continued skilled OT in order to maximize independence and safety with LB ADLs and functional mobility required for safe d/c home.     Follow Up Recommendations  Home health OT;Supervision - Intermittent    Equipment Recommendations  None recommended by OT    Recommendations for Other Services       Precautions / Restrictions Precautions Precautions: Fall Restrictions Weight Bearing Restrictions: Yes LLE Weight Bearing: Weight bearing as tolerated      Mobility Bed Mobility Overal bed mobility: Needs Assistance Bed Mobility: Sit to Supine     Supine to sit: Supervision Sit to supine: Min assist   General bed mobility comments: Min A to manage LLE into bed  Transfers Overall transfer level: Needs assistance Equipment used: Rolling walker (2 wheeled) Transfers: Sit to/from Stand Sit to Stand: Min assist         General transfer comment: Min A to boost up     Balance Overall balance assessment: Needs assistance         Standing balance support: Bilateral upper extremity supported Standing balance-Leahy Scale: Poor Standing balance comment: RW for support                            ADL Overall ADL's :  Needs assistance/impaired Eating/Feeding: Set up;Sitting                   Lower Body Dressing: Moderate assistance;Sit to/from stand Lower Body Dressing Details (indicate cue type and reason): Educated on use of AE for increased independence with LB ADLs, pt is open to use of AE-will practice next session. Toilet Transfer: Minimal assistance;Stand-pivot;BSC;RW             General ADL Comments: Son present for OT eval. Session limited due to pain. Educated pt on compensatory strategies for LB ADLs, use of AE for increased independence with LB ADLs, edema management techniques; pt verbalized understanding.      Vision     Perception     Praxis      Pertinent Vitals/Pain Pain Assessment: 0-10 Pain Score: 8  Pain Location: L hip Pain Descriptors / Indicators: Aching;Sore;Grimacing;Guarding Pain Intervention(s): Limited activity within patient's tolerance;Monitored during session;Repositioned;Ice applied;Premedicated before session     Hand Dominance     Extremity/Trunk Assessment Upper Extremity Assessment Upper Extremity Assessment: Overall WFL for tasks assessed   Lower Extremity Assessment Lower Extremity Assessment: Defer to PT evaluation LLE Deficits / Details: L hip ABD AAROM WFL, L hip flexion AAROM decr 50% limited by pain.  L knee extension 3/5.    Cervical / Trunk Assessment Cervical / Trunk Assessment: Normal   Communication Communication Communication: No difficulties   Cognition Arousal/Alertness: Awake/alert Behavior During Therapy: WFL for tasks assessed/performed  Overall Cognitive Status: Within Functional Limits for tasks assessed                     General Comments       Exercises       Shoulder Instructions      Home Living Family/patient expects to be discharged to:: Private residence Living Arrangements: Other relatives;Children (adult son will stay until Sunday, pt lives with 57 yo dgt ) Available Help at Discharge:  Family;Available 24 hours/day Type of Home: House Home Access: Stairs to enter [employer] of Steps: 7 Entrance Stairs-Rails: Can reach both;Left;Right Home Layout: One level     Bathroom Shower/Tub: Occupational psychologist: Standard Bathroom Accessibility: Yes How Accessible: Accessible via walker Home Equipment: [place] - 2 wheels;Bedside commode;Shower seat          Prior Functioning/Environment Level of Independence: Independent        Comments: no h/o falls    OT Diagnosis: Generalized weakness;Acute pain   OT Problem List: Decreased activity tolerance;Impaired balance (sitting and/or standing);Decreased safety awareness;Decreased knowledge of use of DME or AE;Decreased knowledge of precautions;Pain   OT Treatment/Interventions:      OT Goals(Current goals can be found in the care plan section) Acute Rehab OT Goals Patient Stated Goal: return to independence  OT Goal Formulation: With patient Time For Goal Achievement: 11/07/15 Potential to Achieve Goals: Good ADL Goals Pt Will Perform Lower Body Bathing: with supervision;with adaptive equipment;sit to/from stand Pt Will Perform Lower Body Dressing: with supervision;with adaptive equipment;sit to/from stand Pt Will Transfer to Toilet: with min guard assist;ambulating;bedside commode (over toilet) Pt Will Perform Toileting - Clothing Manipulation and hygiene: with supervision;sit to/from stand Pt Will Perform Tub/Shower Transfer: Shower transfer;with min guard assist;ambulating;shower seat;rolling walker  OT Frequency: Min 2X/week   Barriers to D/C:            Co-evaluation              End of Session Equipment Utilized During Treatment: Gait belt;Rolling walker  Activity Tolerance: Patient limited by pain Patient left: in bed;with call bell/phone within reach;with family/visitor present   Time: 2162-4469 OT Time Calculation (min): 17 min Charges:  OT General Charges $OT Visit:  1 Procedure OT Evaluation $Initial OT Evaluation Tier I: 1 Procedure G-Codes:     Binnie Kand M.S., OTR/L Pager: [phone]  10/24/2015, 12:09 PM

## 2015-10-24 NOTE — Progress Notes (Signed)
Subjective: 1 Day Post-Op Procedure(s) (LRB): TOTAL HIP ARTHROPLASTY ANTERIOR APPROACH (Left)  Activity level:  wbat Diet tolerance:  ok Voiding:  ok Patient reports pain as mild.    Objective: Vital signs in last 24 hours: Temp:  [97.2 F (36.2 C)-99.3 F (37.4 C)] 98.1 F (36.7 C) (11/02 0600) Pulse Rate:  [59-85] 77 (11/02 0600) Resp:  [11-23] 18 (11/02 0600) BP: (95-132)/(47-73) 109/57 mmHg (11/02 0600) SpO2:  [93 %-100 %] 95 % (11/02 0600) Weight:  [93.895 kg (207 lb)] 93.895 kg (207 lb) (11/01 0849)  Labs: No results for input(s): HGB in the last 72 hours. No results for input(s): WBC, RBC, HCT, PLT in the last 72 hours. No results for input(s): NA, K, CL, CO2, BUN, CREATININE, GLUCOSE, CALCIUM in the last 72 hours. No results for input(s): LABPT, INR in the last 72 hours.  Physical Exam:  Neurologically intact ABD soft Neurovascular intact Sensation intact distally Intact pulses distally Dorsiflexion/Plantar flexion intact Incision: dressing C/D/I and no drainage No cellulitis present Compartment soft  Assessment/Plan:  1 Day Post-Op Procedure(s) (LRB): TOTAL HIP ARTHROPLASTY ANTERIOR APPROACH (Left) Advance diet Up with therapy D/C IV fluids Plan for discharge tomorrow Discharge home with home health if still doing well and cleared by PT. Follow up in office 2 weeks post op. Continue on ASA 325mg  BID x 4 weeks for DVT prevention.  [name], Larwance Sachs 10/24/2015, 8:00 AM

## 2015-10-24 NOTE — Clinical Social Work Note (Signed)
CSW received referral for SNF.  Case discussed with case manager and plan is to discharge home.  CSW to sign off please re-consult if social work needs arise.  Ashten Sarnowski R. Alianna Wurster, MSW, LCSWA [phone]  

## 2015-10-24 NOTE — Progress Notes (Signed)
Physical Therapy Treatment Patient Details Name: [name] MRN: [id number] DOB: 04-07-1956 Today's Date: 10/24/2015    History of Present Illness 59 y.o. female admitted for L THA -direct anterior approach.     PT Comments    Pt progressing well with mobility, she walked 36' with RW. Will plan to progress home exercise program and  do stair training tomorrow morning, then she likely will be ready to DC home from PT standpoint.   Follow Up Recommendations  Home health PT;Supervision for mobility/OOB     Equipment Recommendations  None recommended by PT    Recommendations for Other Services OT consult     Precautions / Restrictions Precautions Precautions: Fall Restrictions Weight Bearing Restrictions: Yes LLE Weight Bearing: Weight bearing as tolerated    Mobility  Bed Mobility Overal bed mobility: Needs Assistance Bed Mobility: Supine to Sit     Supine to sit: Supervision Sit to supine: Min assist   General bed mobility comments: used sheet as leg lifter for LLE, min A for LLE support, verbal cues for technique  Transfers Overall transfer level: Needs assistance Equipment used: Rolling walker (2 wheeled) Transfers: Sit to/from Stand Sit to Stand: Min guard         General transfer comment: verbal cues for hand placement  Ambulation/Gait Ambulation/Gait assistance: Supervision Ambulation Distance (Feet): 80 Feet Assistive device: Rolling walker (2 wheeled) Gait Pattern/deviations: Step-to pattern;Decreased step length - left;Decreased weight shift to left     General Gait Details: steady with walker, good posture, no LOB   Stairs            Wheelchair Mobility    Modified Rankin (Stroke Patients Only)       Balance Overall balance assessment: Needs assistance         Standing balance support: Bilateral upper extremity supported Standing balance-Leahy Scale: Fair Standing balance comment: RW for support                     Cognition Arousal/Alertness: Awake/alert Behavior During Therapy: WFL for tasks assessed/performed Overall Cognitive Status: Within Functional Limits for tasks assessed                      Exercises Total Joint Exercises Ankle Circles/Pumps: AROM;Both;10 reps;Supine Quad Sets: AROM;Left;5 reps;Supine Heel Slides: AAROM;Left;15 reps;Supine Hip ABduction/ADduction: AAROM;Left;15 reps;Supine Long Arc Quad: AROM;Left;10 reps;Seated    General Comments        Pertinent Vitals/Pain Pain Assessment: 0-10 Pain Score: 3  Pain Location: L hip with walking Pain Descriptors / Indicators: Sore Pain Intervention(s): Limited activity within patient's tolerance;Monitored during session;Premedicated before session;Ice applied    Home Living Family/patient expects to be discharged to:: Private residence Living Arrangements: Other relatives;Children (adult son will stay until Sunday, pt lives with 57 yo dgt ) Available Help at Discharge: Family;Available 24 hours/day Type of Home: House Home Access: Stairs to enter Entrance Stairs-Rails: Can reach both;Left;Right Home Layout: One level Home Equipment: Walker - 2 wheels;Bedside commode;Shower seat      Prior Function Level of Independence: Independent      Comments: no h/o falls   PT Goals (current goals can now be found in the care plan section) Acute Rehab PT Goals Patient Stated Goal: to be able to squat, to walk better PT Goal Formulation: With patient Time For Goal Achievement: 10/31/15 Potential to Achieve Goals: Good Progress towards PT goals: Progressing toward goals    Frequency  7X/week    PT Plan Current  plan remains appropriate    Co-evaluation             End of Session Equipment Utilized During Treatment: Gait belt Activity Tolerance: Patient tolerated treatment well Patient left: in chair;with call bell/phone within reach;with family/visitor present     Time: 1324-1350 PT Time Calculation  (min) (ACUTE ONLY): 26 min  Charges:  $Gait Training: 8-22 mins $Therapeutic Exercise: 8-22 mins                    G Codes:      Philomena Doheny 10/24/2015, 1:55 PM [phone]

## 2015-10-25 MED ORDER — MIRABEGRON ER 25 MG PO TB24: 50.0000 mg | ORAL_TABLET | Freq: Every day | ORAL | Status: DC

## 2015-10-25 MED ORDER — ASPIRIN 325 MG PO TBEC: 325.0000 mg | DELAYED_RELEASE_TABLET | Freq: Two times a day (BID) | ORAL | Status: DC

## 2015-10-25 MED ORDER — METHOCARBAMOL 500 MG PO TABS: 500.0000 mg | ORAL_TABLET | Freq: Four times a day (QID) | ORAL | Status: DC | PRN

## 2015-10-25 MED ORDER — HYDROCODONE-ACETAMINOPHEN 5-325 MG PO TABS: 1.0000 | ORAL_TABLET | ORAL | Status: DC | PRN

## 2015-10-25 NOTE — Progress Notes (Signed)
Pt ready for discharge. IV removed and discharge instructions/education reviewed. All questions answered and pt states she already has a follow up appointment set up with Dr. Rhona Raider. Pt's belongings sent down with her son, and pt will be transported out via wheelchair to son's car. Zola Button, RN

## 2015-10-25 NOTE — Progress Notes (Signed)
Physical Therapy Treatment Patient Details Name: NATALEA SUTLIFF MRN: [id number] DOB: Mar 27, 1956 Today's Date: 11-22-2015    History of Present Illness 59 y.o. female admitted for L THA -direct anterior approach.     PT Comments    Pt moving very well with increased gait, strength and performed stairs today. Pt educated for HEP and bed mobility. Will continue to follow. Friend and son present throughout session. Ice applied end of session.   Follow Up Recommendations  Home health PT;Supervision for mobility/OOB     Equipment Recommendations       Recommendations for Other Services       Precautions / Restrictions Precautions Precautions: Fall Precaution Comments: direct anterior- no precautions Restrictions Weight Bearing Restrictions: Yes LLE Weight Bearing: Weight bearing as tolerated    Mobility  Bed Mobility Overal bed mobility: Needs Assistance Bed Mobility: Sit to Supine       Sit to supine: Min guard   General bed mobility comments: cues for sequence with use of RLE to assist LLE  Transfers Overall transfer level: Needs assistance     Sit to Stand: Supervision         General transfer comment: cues for hand placement and safety  Ambulation/Gait Ambulation/Gait assistance: Supervision Ambulation Distance (Feet): 300 Feet Assistive device: Rolling walker (2 wheeled) Gait Pattern/deviations: Step-through pattern;Decreased stride length   Gait velocity interpretation: Below normal speed for age/gender General Gait Details: cues for position in RW   Stairs Stairs: Yes Stairs assistance: Supervision Stair Management: Step to pattern;Forwards;Two rails Number of Stairs: 7 General stair comments: excellent performance with stairs with cues for sequence  Wheelchair Mobility    Modified Rankin (Stroke Patients Only)       Balance                                    Cognition Arousal/Alertness: Awake/alert Behavior During Therapy:  WFL for tasks assessed/performed Overall Cognitive Status: Within Functional Limits for tasks assessed                      Exercises Total Joint Exercises Gluteal Sets: AROM;Seated;Both;15 reps Heel Slides: AROM;Left;15 reps;Supine Hip ABduction/ADduction: AROM;Left;15 reps;Supine    General Comments        Pertinent Vitals/Pain Pain Assessment: No/denies pain    Home Living                      Prior Function            PT Goals (current goals can now be found in the care plan section) Progress towards PT goals: Progressing toward goals    Frequency       PT Plan Current plan remains appropriate    Co-evaluation             End of Session   Activity Tolerance: Patient tolerated treatment well Patient left: in bed;with call bell/phone within reach;with family/visitor present     Time: 6389-3734 PT Time Calculation (min) (ACUTE ONLY): 27 min  Charges:  $Gait Training: 8-22 mins $Therapeutic Exercise: 8-22 mins                    G Codes:      Melford Aase 11/22/2015, 10:44 AM Elwyn Reach, [place]

## 2015-10-25 NOTE — Progress Notes (Signed)
Physical Therapy Treatment Patient Details Name: [name] MRN: [id number] DOB: [birth year] Today's Date: 2015-10-29    History of Present Illness 59 y.o. female admitted for L THA -direct anterior approach.     PT Comments    [name] continues to move exceptionally well and increased gait distance again this afternoon. Educated for HEP and pt safe for D/C with mobility and HEP.   Follow Up Recommendations  Home health PT;Supervision for mobility/OOB     Equipment Recommendations       Recommendations for Other Services       Precautions / Restrictions Precautions Precautions: Fall Precaution Comments: direct anterior- no precautions Restrictions LLE Weight Bearing: Weight bearing as tolerated    Mobility  Bed Mobility          General bed mobility comments: in chair beginning and end of session  Transfers Overall transfer level: Modified independent                Ambulation/Gait Ambulation/Gait assistance: Modified independent (Device/Increase time) Ambulation Distance (Feet): 350 Feet Assistive device: Rolling walker (2 wheeled) Gait Pattern/deviations: Step-through pattern;Decreased stride length   Gait velocity interpretation: Below normal speed for age/gender    Stairs  Wheelchair Mobility    Modified Rankin (Stroke Patients Only)       Balance                                    Cognition Arousal/Alertness: Awake/alert Behavior During Therapy: WFL for tasks assessed/performed Overall Cognitive Status: Within Functional Limits for tasks assessed                      Exercises Total Joint Exercises  Hip ABduction/ADduction: AROM;Seated;Left;15 reps Long Arc Quad: AROM;Left;Seated;15 reps Marching in Standing: AROM;Seated;Left;15 reps    General Comments        Pertinent Vitals/Pain Pain Assessment: No/denies pain Pain Score: 2  Pain Location: left hip Pain Descriptors / Indicators: Sore Pain  Intervention(s): Repositioned;Monitored during session    Home Living                      Prior Function            PT Goals (current goals can now be found in the care plan section) Progress towards PT goals: Progressing toward goals    Frequency       PT Plan Current plan remains appropriate    Co-evaluation             End of Session   Activity Tolerance: Patient tolerated treatment well Patient left: in chair;with call bell/phone within [name];with family/visitor present     Time: 7858-8502 PT Time Calculation (min) (ACUTE ONLY): 18 min  Charges:  $Gait Training: 8-22 mins                     G Codes:      [name] October 29, 2015, 2:41 PM [name], [name]

## 2015-10-25 NOTE — Discharge Summary (Signed)
Patient ID: [name] MRN: [id number] DOB/AGE: [birth year] 59 y.o.  Admit date: 10/23/2015 Discharge date: 10/25/2015  Admission Diagnoses:  Principal Problem:   Primary osteoarthritis of left hip   Discharge Diagnoses:  Same  Past Medical History  Diagnosis Date  . Thyroid disease   . Palpitation   . Obesity   . HLD (hyperlipidemia)   . Hypothyroidism   . Arthritis   . Overactive bladder     Surgeries: Procedure(s): TOTAL HIP ARTHROPLASTY ANTERIOR APPROACH on 10/23/2015   Consultants:    Discharged Condition: Improved  Hospital Course: [name] is an 59 y.o. female who was admitted 10/23/2015 for operative treatment ofPrimary osteoarthritis of left hip. Patient has severe unremitting pain that affects sleep, daily activities, and work/hobbies. After pre-op clearance the patient was taken to the operating room on 10/23/2015 and underwent  Procedure(s): TOTAL HIP ARTHROPLASTY ANTERIOR APPROACH.    Patient was given perioperative antibiotics: Anti-infectives    Start     Dose/Rate Route Frequency Ordered Stop   10/23/15 1530  ceFAZolin (ANCEF) IVPB 2 g/50 mL premix     2 g 100 mL/hr over 30 Minutes Intravenous Every 6 hours 10/23/15 1410 10/23/15 2201   10/23/15 1521  ceFAZolin (ANCEF) 2-3 GM-% IVPB SOLR    Comments:  Kendell Bane   : cabinet override      10/23/15 1521 10/24/15 0329   10/23/15 0930  ceFAZolin (ANCEF) IVPB 2 g/50 mL premix     2 g 100 mL/hr over 30 Minutes Intravenous To [facility] 10/22/15 1320 10/23/15 1035       Patient was given sequential compression devices, early ambulation, and chemoprophylaxis to prevent DVT.  Patient benefited maximally from hospital stay and there were no complications.    Recent vital signs: Patient Vitals for the past 24 hrs:  BP Temp Temp src Pulse Resp SpO2  10/25/15 1314 (!) 119/54 mmHg 98.4 F (36.9 C) Oral 83 18 100 %  10/25/15 0524 (!) 116/56 mmHg 100 F (37.8 C) Oral 93 18 100 %  10/24/15 2049  113/60 mmHg 98.7 F (37.1 C) Oral 88 16 96 %     Recent laboratory studies: No results for input(s): WBC, HGB, HCT, PLT, NA, K, CL, CO2, BUN, CREATININE, GLUCOSE, INR, CALCIUM in the last 72 hours.  Invalid input(s): PT, 2   Discharge Medications:     Medication List    TAKE these medications        acetaminophen 325 MG tablet  Commonly known as:  TYLENOL  Take 650 mg by mouth every 6 (six) hours as needed for mild pain.     aspirin 325 MG EC tablet  Take 1 tablet (325 mg total) by mouth 2 (two) times daily after a meal.     azelastine 0.1 % nasal spray  Commonly known as:  ASTELIN  Place 1 spray into both nostrils as needed for allergies.     clobetasol cream 0.05 %  Commonly known as:  TEMOVATE  Apply 1 application topically 2 (two) times daily as needed (itching).     Fish Oil 1000 MG Caps  Take 2,000 mg by mouth daily.     hydrochlorothiazide 25 MG tablet  Commonly known as:  HYDRODIURIL  Take 1 tablet (25 mg total) by mouth daily.     HYDROcodone-acetaminophen 5-325 MG tablet  Commonly known as:  NORCO/VICODIN  Take 1-2 tablets by mouth every 4 (four) hours as needed (breakthrough pain).     levothyroxine 88 MCG  tablet  Commonly known as:  SYNTHROID, LEVOTHROID  Take 88 mcg by mouth daily.     methocarbamol 500 MG tablet  Commonly known as:  ROBAXIN  Take 1 tablet (500 mg total) by mouth every 6 (six) hours as needed for muscle spasms.     metoprolol succinate 25 MG 24 hr tablet  Commonly known as:  TOPROL-XL  TAKE 1 TABLET BY MOUTH EVERY DAY     mirabegron ER 50 MG Tb24 tablet  Commonly known as:  MYRBETRIQ  Take 50 mg by mouth daily.     pantoprazole 40 MG tablet  Commonly known as:  PROTONIX  TAKE 1 TABLET BY MOUTH EVERY DAY     simvastatin 20 MG tablet  Commonly known as:  ZOCOR  Take 20 mg by mouth daily.     TURMERIC PO  Take 1 capsule by mouth daily.     Vitamin D 2000 UNITS Caps  Take 1 capsule by mouth daily.        Diagnostic  Studies: Dg Chest 2 View  10/16/2015  CLINICAL DATA:  Preop for left hip replacement EXAM: CHEST  2 VIEW COMPARISON:  None. FINDINGS: The heart size and mediastinal contours are within normal limits. Both lungs are clear. The visualized skeletal structures are unremarkable. IMPRESSION: No active cardiopulmonary disease. Electronically Signed   By: Skipper Cliche M.D.   On: 10/16/2015 17:12   Dg Hip Operative Unilat With Pelvis Left  10/23/2015  CLINICAL DATA:  Intra operative views of the left hip EXAM: OPERATIVE left HIP (WITH PELVIS IF PERFORMED)  VIEWS TECHNIQUE: Fluoroscopic spot image(s) were submitted for interpretation post-operatively. Fluoro time reported: 0 minutes 36 second COMPARISON:  Left hip series of May 29, 2015 FINDINGS: 2 fluoro spot images reveal a prosthetic left hip joint. Radiographic positioning of the prosthetic components is good. The interface with the native bone appears normal. IMPRESSION: Intraoperative fluoro spot images revealing placement of a left hip joint prosthesis. No immediate postprocedure complication is observed. Electronically Signed   By: David  Martinique M.D.   On: 10/23/2015 12:32    Disposition: Final discharge disposition not confirmed      Discharge Instructions    Call MD / Call 911    Complete by:  As directed   If you experience chest pain or shortness of breath, CALL 911 and be transported to the hospital emergency room.  If you develope a fever above 101 F, pus (white drainage) or increased drainage or redness at the wound, or calf pain, call your surgeon's office.     Constipation Prevention    Complete by:  As directed   Drink plenty of fluids.  Prune juice may be helpful.  You may use a stool softener, such as Colace (over the counter) 100 mg twice a day.  Use MiraLax (over the counter) for constipation as needed.     Diet - low sodium heart healthy    Complete by:  As directed      Discharge instructions    Complete by:  As directed    INSTRUCTIONS AFTER JOINT REPLACEMENT   Remove items at home which could result in a fall. This includes throw rugs or furniture in walking pathways ICE to the affected joint every three hours while awake for 30 minutes at a time, for at least the first 3-5 days, and then as needed for pain and swelling.  Continue to use ice for pain and swelling. You may notice swelling that will progress down  to the foot and ankle.  This is normal after surgery.  Elevate your leg when you are not up walking on it.   Continue to use the breathing machine you got in the hospital (incentive spirometer) which will help keep your temperature down.  It is common for your temperature to cycle up and down following surgery, especially at night when you are not up moving around and exerting yourself.  The breathing machine keeps your lungs expanded and your temperature down.   DIET:  As you were doing prior to hospitalization, we recommend a well-balanced diet.  DRESSING / WOUND CARE / SHOWERING  You may shower 3 days after surgery, but keep the wounds dry during showering.  You may use an occlusive plastic wrap (Press'n Seal for example), NO SOAKING/SUBMERGING IN THE BATHTUB.  If the bandage gets wet, change with a clean dry gauze.  If the incision gets wet, pat the wound dry with a clean towel.  ACTIVITY  Increase activity slowly as tolerated, but follow the weight bearing instructions below.   No driving for 6 weeks or until further direction given by your physician.  You cannot drive while taking narcotics.  No lifting or carrying greater than 10 lbs. until further directed by your surgeon. Avoid periods of inactivity such as sitting longer than an hour when not asleep. This helps prevent blood clots.  You may return to work once you are authorized by your doctor.     WEIGHT BEARING   Weight bearing as tolerated with assist device (walker, cane, etc) as directed, use it as long as suggested by your surgeon or  therapist, typically at least 4-6 weeks.   EXERCISES  Results after joint replacement surgery are often greatly improved when you follow the exercise, range of motion and muscle strengthening exercises prescribed by your doctor. Safety measures are also important to protect the joint from further injury. Any time any of these exercises cause you to have increased pain or swelling, decrease what you are doing until you are comfortable again and then slowly increase them. If you have problems or questions, call your caregiver or physical therapist for advice.   Rehabilitation is important following a joint replacement. After just a few days of immobilization, the muscles of the leg can become weakened and shrink (atrophy).  These exercises are designed to build up the tone and strength of the thigh and leg muscles and to improve motion. Often times heat used for twenty to thirty minutes before working out will loosen up your tissues and help with improving the range of motion but do not use heat for the first two weeks following surgery (sometimes heat can increase post-operative swelling).   These exercises can be done on a training (exercise) mat, on the floor, on a table or on a bed. Use whatever works the best and is most comfortable for you.    Use music or television while you are exercising so that the exercises are a pleasant break in your day. This will make your life better with the exercises acting as a break in your routine that you can look forward to.   Perform all exercises about fifteen times, three times per day or as directed.  You should exercise both the operative leg and the other leg as well.   Exercises include:   Quad Sets - Tighten up the muscle on the front of the thigh (Quad) and hold for 5-10 seconds.   Straight Leg Raises - With  your knee straight (if you were given a brace, keep it on), lift the leg to 60 degrees, hold for 3 seconds, and slowly lower the leg.  Perform this  exercise against resistance later as your leg gets stronger.  Leg Slides: Lying on your back, slowly slide your foot toward your buttocks, bending your knee up off the floor (only go as far as is comfortable). Then slowly slide your foot back down until your leg is flat on the floor again.  Angel Wings: Lying on your back spread your legs to the side as far apart as you can without causing discomfort.  Hamstring Strength:  Lying on your back, push your heel against the floor with your leg straight by tightening up the muscles of your buttocks.  Repeat, but this time bend your knee to a comfortable angle, and push your heel against the floor.  You may put a pillow under the heel to make it more comfortable if necessary.   A rehabilitation program following joint replacement surgery can speed recovery and prevent re-injury in the future due to weakened muscles. Contact your doctor or a physical therapist for more information on knee rehabilitation.    CONSTIPATION  Constipation is defined medically as fewer than three stools per week and severe constipation as less than one stool per week.  Even if you have a regular bowel pattern at home, your normal regimen is likely to be disrupted due to multiple reasons following surgery.  Combination of anesthesia, postoperative narcotics, change in appetite and fluid intake all can affect your bowels.   YOU MUST use at least one of the following options; they are listed in order of increasing strength to get the job done.  They are all available over the counter, and you may need to use some, POSSIBLY even all of these options:    Drink plenty of fluids (prune juice may be helpful) and high fiber foods Colace 100 mg by mouth twice a day  Senokot for constipation as directed and as needed Dulcolax (bisacodyl), take with full glass of water  Miralax (polyethylene glycol) once or twice a day as needed.  If you have tried all these things and are unable to have a  bowel movement in the first 3-4 days after surgery call either your surgeon or your primary doctor.    If you experience loose stools or diarrhea, hold the medications until you stool forms back up.  If your symptoms do not get better within 1 week or if they get worse, check with your doctor.  If you experience "the worst abdominal pain ever" or develop nausea or vomiting, please contact the office immediately for further recommendations for treatment.   ITCHING:  If you experience itching with your medications, try taking only a single pain pill, or even half a pain pill at a time.  You can also use Benadryl over the counter for itching or also to help with sleep.   TED HOSE STOCKINGS:  Use stockings on both legs until for at least 2 weeks or as directed by physician office. They may be removed at night for sleeping.  MEDICATIONS:  See your medication summary on the "After Visit Summary" that nursing will review with you.  You may have some home medications which will be placed on hold until you complete the course of blood thinner medication.  It is important for you to complete the blood thinner medication as prescribed.  PRECAUTIONS:  If you experience chest pain  or shortness of breath - call 911 immediately for transfer to the hospital emergency department.   If you develop a fever greater that 101 F, purulent drainage from wound, increased redness or drainage from wound, foul odor from the wound/dressing, or calf pain - CONTACT YOUR SURGEON.                                                   FOLLOW-UP APPOINTMENTS:  If you do not already have a post-op appointment, please call the office for an appointment to be seen by your surgeon.  Guidelines for how soon to be seen are listed in your "After Visit Summary", but are typically between 1-4 weeks after surgery.  OTHER INSTRUCTIONS:   Knee Replacement:  Do not place pillow under knee, focus on keeping the knee straight while resting. CPM  instructions: 0-90 degrees, 2 hours in the morning, 2 hours in the afternoon, and 2 hours in the evening. Place foam block, curve side up under heel at all times except when in CPM or when walking.  DO NOT modify, tear, cut, or change the foam block in any way.  MAKE SURE YOU:  Understand these instructions.  Get help right away if you are not doing well or get worse.    Thank you for letting us be a part of your medical care team.  It is a privilege we respect greatly.  We hope these instructions will help you stay on track for a fast and full recovery!     Increase activity slowly as tolerated    Complete by:  As directed            Follow-up Information    Follow up with Hessie Dibble, MD. Schedule an appointment as soon as possible for a visit in 2 weeks.   Specialty:  Orthopedic Surgery   Contact information:   [place] [address] [phone]        Signed: Rich Fuchs 10/25/2015, 2:37 PM

## 2015-10-25 NOTE — Progress Notes (Signed)
Utilization review completed. Demetrio Leighty, RN, BSN. 

## 2015-11-12 ENCOUNTER — Ambulatory Visit: Payer: Commercial Managed Care - PPO | Admitting: Internal Medicine

## 2015-12-21 ENCOUNTER — Other Ambulatory Visit: Payer: Self-pay | Admitting: Cardiology

## 2016-01-16 ENCOUNTER — Other Ambulatory Visit: Payer: Self-pay | Admitting: Cardiology

## 2016-01-31 ENCOUNTER — Other Ambulatory Visit: Payer: Self-pay | Admitting: Nurse Practitioner

## 2016-03-10 IMAGING — RF DG HIP (WITH PELVIS) OPERATIVE*L*
1 series · 2 of 2 positions shown · non-contrast
Comparison: Left hip series of May 29, 2015

CLINICAL DATA: Intra operative views of the left hip

EXAM:
OPERATIVE left HIP (WITH PELVIS IF PERFORMED)  VIEWS
TECHNIQUE: Fluoroscopic spot image(s) were submitted for interpretation
post-operatively. Fluoro time reported: 0 minutes 36 second

[Series 1: run · 2 of 2 slices shown]
[im 1/2]
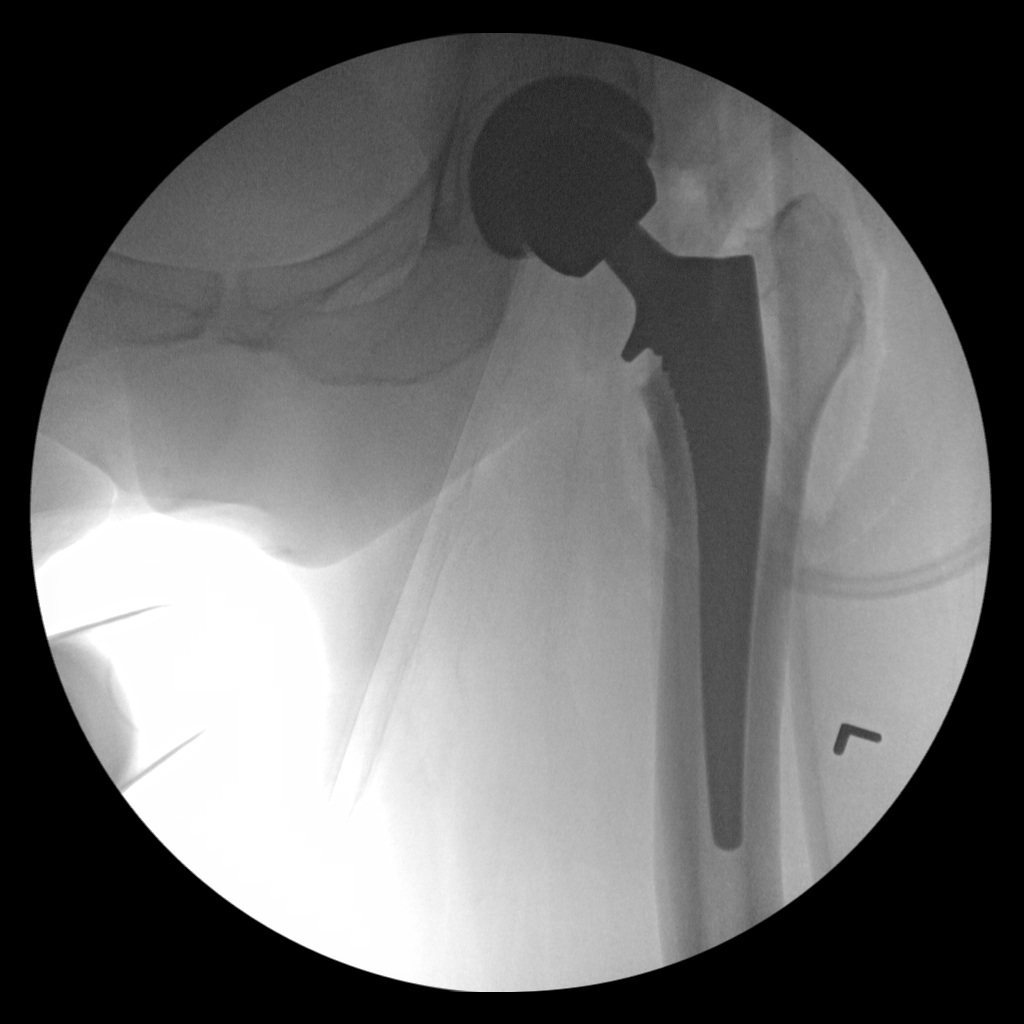
[im 2/2]
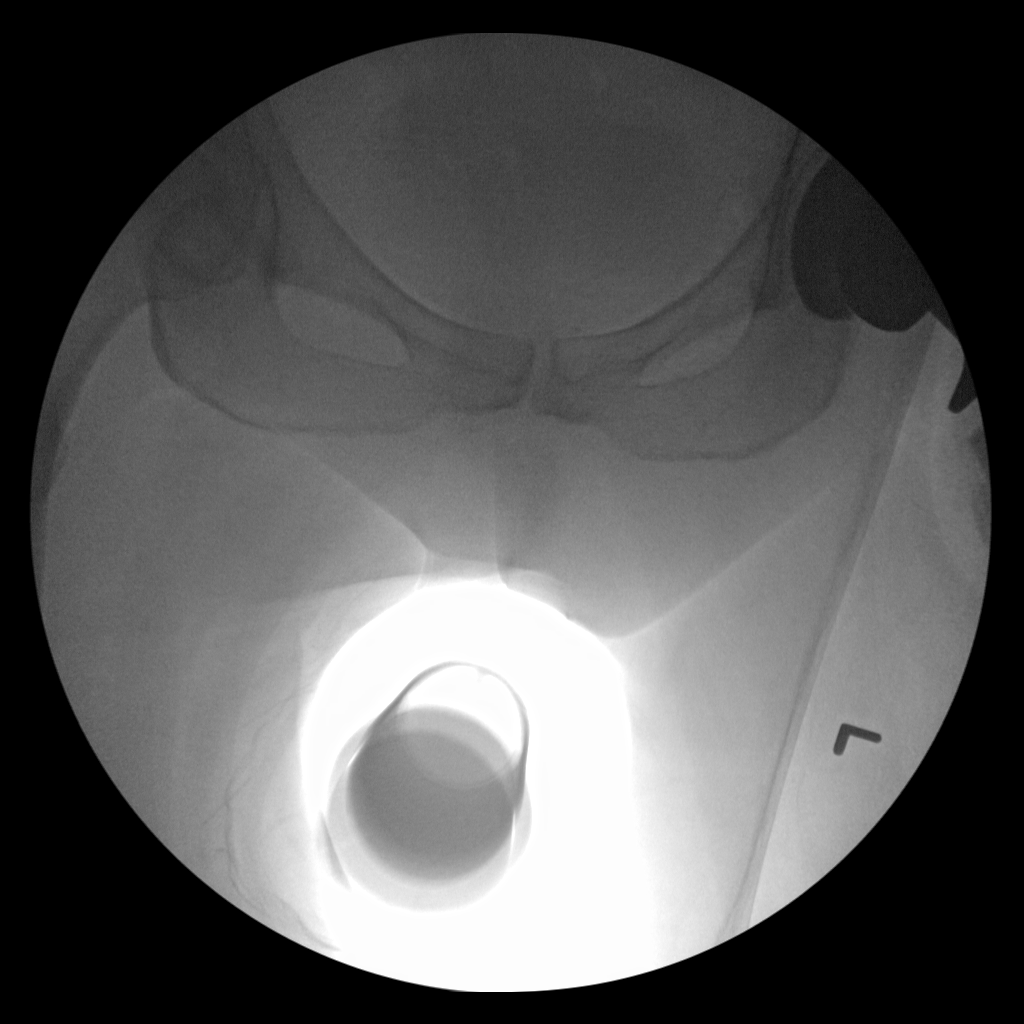

[2 of 2 positions shown; findings below may reference images not displayed]

FINDINGS: 2 fluoro spot images reveal a prosthetic left hip joint.
Radiographic positioning of the prosthetic components is good. The
interface with the native bone appears normal.
IMPRESSION: Intraoperative fluoro spot images revealing placement of a left hip
joint prosthesis. No immediate postprocedure complication is
observed.

## 2016-03-12 ENCOUNTER — Other Ambulatory Visit: Payer: Self-pay | Admitting: Cardiology

## 2016-03-18 ENCOUNTER — Telehealth: Payer: Self-pay | Admitting: Internal Medicine

## 2016-03-18 NOTE — Telephone Encounter (Signed)
Tried calling pt and voicemail is not available.    RE: Flu Vaccine 2016-2017

## 2016-04-09 ENCOUNTER — Other Ambulatory Visit: Payer: Self-pay | Admitting: Cardiology

## 2016-05-09 ENCOUNTER — Other Ambulatory Visit: Payer: Self-pay | Admitting: Nurse Practitioner

## 2016-08-08 ENCOUNTER — Other Ambulatory Visit: Payer: Self-pay | Admitting: Nurse Practitioner

## 2016-09-08 ENCOUNTER — Ambulatory Visit (INDEPENDENT_AMBULATORY_CARE_PROVIDER_SITE_OTHER): Payer: Commercial Managed Care - PPO | Admitting: Internal Medicine

## 2016-09-08 ENCOUNTER — Encounter: Payer: Self-pay | Admitting: Internal Medicine

## 2016-09-08 DIAGNOSIS — M7662 Achilles tendinitis, left leg: Secondary | ICD-10-CM

## 2016-09-08 DIAGNOSIS — Z23 Encounter for immunization: Secondary | ICD-10-CM | POA: Diagnosis not present

## 2016-09-08 NOTE — Patient Instructions (Signed)
You can use ice on the area and rest it for the next 1 week or so.   If there is a small tear in the achilles tendon it can take 2-4 weeks to heal fully on its own.   Start some stretching exercises in about 1 week.    Achilles Tendinitis With Rehab Achilles tendinitis is a disorder of the Achilles tendon. The Achilles tendon connects the large calf muscles (Gastrocnemius and Soleus) to the heel bone (calcaneus). This tendon is sometimes called the heel cord. It is important for pushing-off and standing on your toes and is important for walking, running, or jumping. Tendinitis is often caused by overuse and repetitive microtrauma. SYMPTOMS  Pain, tenderness, swelling, warmth, and redness may occur over the Achilles tendon even at rest.  Pain with pushing off, or flexing or extending the ankle.  Pain that is worsened after or during activity. CAUSES   Overuse sometimes seen with rapid increase in exercise programs or in sports requiring running and jumping.  Poor physical conditioning (strength and flexibility or endurance).  Running sports, especially training running down hills.  Inadequate warm-up before practice or play or failure to stretch before participation.  Injury to the tendon. PREVENTION   Warm up and stretch before practice or competition.  Allow time for adequate rest and recovery between practices and competition.  Keep up conditioning.  Keep up ankle and leg flexibility.  Improve or keep muscle strength and endurance.  Improve cardiovascular fitness.  Use proper technique.  Use proper equipment (shoes, skates).  To help prevent recurrence, taping, protective strapping, or an adhesive bandage may be recommended for several weeks after healing is complete. PROGNOSIS   Recovery may take weeks to several months to heal.  Longer recovery is expected if symptoms have been prolonged.  Recovery is usually quicker if the inflammation is due to a direct blow  as compared with overuse or sudden strain. RELATED COMPLICATIONS   Healing time will be prolonged if the condition is not correctly treated. The injury must be given plenty of time to heal.  Symptoms can reoccur if activity is resumed too soon.  Untreated, tendinitis may increase the risk of tendon rupture requiring additional time for recovery and possibly surgery. TREATMENT   The first treatment consists of rest anti-inflammatory medication, and ice to relieve the pain.  Stretching and strengthening exercises after resolution of pain will likely help reduce the risk of recurrence. Referral to a physical therapist or athletic trainer for further evaluation and treatment may be helpful.  A walking boot or cast may be recommended to rest the Achilles tendon. This can help break the cycle of inflammation and microtrauma.  Arch supports (orthotics) may be prescribed or recommended by your caregiver as an adjunct to therapy and rest.  Surgery to remove the inflamed tendon lining or degenerated tendon tissue is rarely necessary and has shown less than predictable results. MEDICATION   Nonsteroidal anti-inflammatory medications, such as aspirin and ibuprofen, may be used for pain and inflammation relief. Do not take within 7 days before surgery. Take these as directed by your caregiver. Contact your caregiver immediately if any bleeding, stomach upset, or signs of allergic reaction occur. Other minor pain relievers, such as acetaminophen, may also be used.  Pain relievers may be prescribed as necessary by your caregiver. Do not take prescription pain medication for longer than 4 to 7 days. Use only as directed and only as much as you need.  Cortisone injections are rarely indicated.  Cortisone injections may weaken tendons and predispose to rupture. It is better to give the condition more time to heal than to use them. HEAT AND COLD  Cold is used to relieve pain and reduce inflammation for acute  and chronic Achilles tendinitis. Cold should be applied for 10 to 15 minutes every 2 to 3 hours for inflammation and pain and immediately after any activity that aggravates your symptoms. Use ice packs or an ice massage.  Heat may be used before performing stretching and strengthening activities prescribed by your caregiver. Use a heat pack or a warm soak. SEEK MEDICAL CARE IF:  Symptoms get worse or do not improve in 2 weeks despite treatment.  New, unexplained symptoms develop. Drugs used in treatment may produce side effects. EXERCISES RANGE OF MOTION (ROM) AND STRETCHING EXERCISES - Achilles Tendinitis  These exercises may help you when beginning to rehabilitate your injury. Your symptoms may resolve with or without further involvement from your physician, physical therapist or athletic trainer. While completing these exercises, remember:   Restoring tissue flexibility helps normal motion to return to the joints. This allows healthier, less painful movement and activity.  An effective stretch should be held for at least 30 seconds.  A stretch should never be painful. You should only feel a gentle lengthening or release in the stretched tissue. STRETCH - Gastroc, Standing   Place hands on wall.  Extend right / left leg, keeping the front knee somewhat bent.  Slightly point your toes inward on your back foot.  Keeping your right / left heel on the floor and your knee straight, shift your weight toward the wall, not allowing your back to arch.  You should feel a gentle stretch in the right / left calf. Hold this position for __________ seconds. Repeat __________ times. Complete this stretch __________ times per day. STRETCH - Soleus, Standing   Place hands on wall.  Extend right / left leg, keeping the other knee somewhat bent.  Slightly point your toes inward on your back foot.  Keep your right / left heel on the floor, bend your back knee, and slightly shift your weight over  the back leg so that you feel a gentle stretch deep in your back calf.  Hold this position for __________ seconds. Repeat __________ times. Complete this stretch __________ times per day. STRETCH - Gastrocsoleus, Standing  Note: This exercise can place a lot of stress on your foot and ankle. Please complete this exercise only if specifically instructed by your caregiver.   Place the ball of your right / left foot on a step, keeping your other foot firmly on the same step.  Hold on to the wall or a rail for balance.  Slowly lift your other foot, allowing your body weight to press your heel down over the edge of the step.  You should feel a stretch in your right / left calf.  Hold this position for __________ seconds.  Repeat this exercise with a slight bend in your knee. Repeat __________ times. Complete this stretch __________ times per day.  STRENGTHENING EXERCISES - Achilles Tendinitis These exercises may help you when beginning to rehabilitate your injury. They may resolve your symptoms with or without further involvement from your physician, physical therapist or athletic trainer. While completing these exercises, remember:   Muscles can gain both the endurance and the strength needed for everyday activities through controlled exercises.  Complete these exercises as instructed by your physician, physical therapist or athletic trainer. Progress the  resistance and repetitions only as guided.  You may experience muscle soreness or fatigue, but the pain or discomfort you are trying to eliminate should never worsen during these exercises. If this pain does worsen, stop and make certain you are following the directions exactly. If the pain is still present after adjustments, discontinue the exercise until you can discuss the trouble with your clinician. STRENGTH - Plantar-flexors   Sit with your right / left leg extended. Holding onto both ends of a rubber exercise band/tubing, loop it  around the ball of your foot. Keep a slight tension in the band.  Slowly push your toes away from you, pointing them downward.  Hold this position for __________ seconds. Return slowly, controlling the tension in the band/tubing. Repeat __________ times. Complete this exercise __________ times per day.  STRENGTH - Plantar-flexors   Stand with your feet shoulder width apart. Steady yourself with a wall or table using as little support as needed.  Keeping your weight evenly spread over the width of your feet, rise up on your toes.*  Hold this position for __________ seconds. Repeat __________ times. Complete this exercise __________ times per day.  *If this is too easy, shift your weight toward your right / left leg until you feel challenged. Ultimately, you may be asked to do this exercise with your right / left foot only. STRENGTH - Plantar-flexors, Eccentric  Note: This exercise can place a lot of stress on your foot and ankle. Please complete this exercise only if specifically instructed by your caregiver.   Place the balls of your feet on a step. With your hands, use only enough support from a wall or rail to keep your balance.  Keep your knees straight and rise up on your toes.  Slowly shift your weight entirely to your right / left toes and pick up your opposite foot. Gently and with controlled movement, lower your weight through your right / left foot so that your heel drops below the level of the step. You will feel a slight stretch in the back of your calf at the end position.  Use the healthy leg to help rise up onto the balls of both feet, then lower weight only on the right / left leg again. Build up to 15 repetitions. Then progress to 3 consecutive sets of 15 repetitions.*  After completing the above exercise, complete the same exercise with a slight knee bend (about 30 degrees). Again, build up to 15 repetitions. Then progress to 3 consecutive sets of 15 repetitions.* Perform  this exercise __________ times per day.  *When you easily complete 3 sets of 15, your physician, physical therapist or athletic trainer may advise you to add resistance by wearing a backpack filled with additional weight. STRENGTH - Plantar Flexors, Seated   Sit on a chair that allows your feet to rest flat on the ground. If necessary, sit at the edge of the chair.  Keeping your toes firmly on the ground, lift your right / left heel as far as you can without increasing any discomfort in your ankle. Repeat __________ times. Complete this exercise __________ times a day. *If instructed by your physician, physical therapist or athletic trainer, you may add ____________________ of resistance by placing a weighted object on your right / left knee.   This information is not intended to replace advice given to you by your health care provider. Make sure you discuss any questions you have with your health care provider.   Document Released:  07/09/2005 Document Revised: 12/29/2014 Document Reviewed: 03/22/2009 Elsevier Interactive Patient Education [employer].

## 2016-09-08 NOTE — Progress Notes (Signed)
   Subjective:    Patient ID: [name], female    DOB: 1956/01/20, 60 y.o.   MRN: [id number]  HPI The patient is a 60 YO female coming in for a lump on her heel for 2 days. She did a yard sale this weekend and was on her feet. She denies direct injury to the area. She saw the NP at her work and they wanted her evaluated for blood clot. She started having pain on Sunday and had a lump with some heat to it. Denies fevers or chills. No calf swelling or pain. No long travel in car or plane in the last month.   Review of Systems  Constitutional: Negative for activity change, appetite change, fatigue, fever and unexpected weight change.  Respiratory: Negative for cough, chest tightness, shortness of breath and wheezing.   Cardiovascular: Negative for chest pain, palpitations and leg swelling.  Gastrointestinal: Negative for abdominal distention, abdominal pain, constipation, diarrhea and nausea.  Musculoskeletal: Positive for arthralgias and myalgias.  Skin: Negative.       Objective:   Physical Exam  Constitutional: She is oriented to person, place, and time. She appears well-developed and well-nourished.  HENT:  Head: Normocephalic and atraumatic.  Eyes: EOM are normal.  Neck: Normal range of motion.  Cardiovascular: Normal rate and regular rhythm.   Pulmonary/Chest: Effort normal. No respiratory distress. She has no wheezes.  Abdominal: Soft. She exhibits no distension. There is no tenderness. There is no rebound.  Musculoskeletal: She exhibits tenderness. She exhibits no edema.  Trace edema in the ankle, soreness over the achilles tendon but no discrete lesion or lump, no tear. No calf swelling or tenderness.   Neurological: She is alert and oriented to person, place, and time. Coordination normal.  Skin: Skin is warm and dry.   Vitals:   09/08/16 1106  BP: 138/90  Pulse: 66  Resp: 18  Temp: 98.1 F (36.7 C)  TempSrc: Oral  SpO2: 98%  Weight: 218 lb (98.9 kg)  Height: 5\' 6"   (1.676 m)      Assessment & Plan:  Flu shot given at visit.

## 2016-09-08 NOTE — Progress Notes (Signed)
Pre visit review using our clinic review tool, if applicable. No additional management support is needed unless otherwise documented below in the visit note. 

## 2016-09-08 NOTE — Assessment & Plan Note (Signed)
Given stretching, encouraged to ice and rest for 1 week then start with stretching. Does not need ultrasound. No evidence of full tear on exam. Mild swelling, no evidence of DVT. NSAIDs for pain.

## 2016-10-08 ENCOUNTER — Other Ambulatory Visit: Payer: Self-pay | Admitting: Nurse Practitioner

## 2016-11-13 ENCOUNTER — Other Ambulatory Visit: Payer: Self-pay | Admitting: Nurse Practitioner

## 2017-01-05 DIAGNOSIS — Z01419 Encounter for gynecological examination (general) (routine) without abnormal findings: Secondary | ICD-10-CM | POA: Diagnosis not present

## 2017-01-05 DIAGNOSIS — Z1231 Encounter for screening mammogram for malignant neoplasm of breast: Secondary | ICD-10-CM | POA: Diagnosis not present

## 2017-01-05 DIAGNOSIS — Z124 Encounter for screening for malignant neoplasm of cervix: Secondary | ICD-10-CM | POA: Diagnosis not present

## 2017-01-08 ENCOUNTER — Other Ambulatory Visit: Payer: Self-pay | Admitting: Obstetrics & Gynecology

## 2017-01-08 DIAGNOSIS — R928 Other abnormal and inconclusive findings on diagnostic imaging of breast: Secondary | ICD-10-CM

## 2017-01-15 ENCOUNTER — Ambulatory Visit
Admission: RE | Admit: 2017-01-15 | Discharge: 2017-01-15 | Disposition: A | Discharge status: Home / Self Care | Payer: Commercial Managed Care - PPO | Source: Ambulatory Visit | Attending: Obstetrics & Gynecology | Admitting: Obstetrics & Gynecology

## 2017-01-15 ENCOUNTER — Other Ambulatory Visit: Payer: Self-pay | Admitting: Obstetrics & Gynecology

## 2017-01-15 DIAGNOSIS — N6321 Unspecified lump in the left breast, upper outer quadrant: Secondary | ICD-10-CM | POA: Diagnosis not present

## 2017-01-15 DIAGNOSIS — C50919 Malignant neoplasm of unspecified site of unspecified female breast: Secondary | ICD-10-CM

## 2017-01-15 DIAGNOSIS — N6489 Other specified disorders of breast: Secondary | ICD-10-CM | POA: Diagnosis not present

## 2017-01-15 DIAGNOSIS — R928 Other abnormal and inconclusive findings on diagnostic imaging of breast: Secondary | ICD-10-CM

## 2017-01-15 DIAGNOSIS — Z170 Estrogen receptor positive status [ER+]: Secondary | ICD-10-CM | POA: Diagnosis not present

## 2017-01-15 DIAGNOSIS — C50412 Malignant neoplasm of upper-outer quadrant of left female breast: Secondary | ICD-10-CM | POA: Diagnosis not present

## 2017-01-15 HISTORY — DX: Malignant neoplasm of unspecified site of unspecified female breast: C50.919

## 2017-01-21 ENCOUNTER — Telehealth: Payer: Self-pay | Admitting: *Deleted

## 2017-01-21 DIAGNOSIS — C50412 Malignant neoplasm of upper-outer quadrant of left female breast: Secondary | ICD-10-CM

## 2017-01-21 DIAGNOSIS — Z170 Estrogen receptor positive status [ER+]: Principal | ICD-10-CM

## 2017-01-21 NOTE — Telephone Encounter (Signed)
Confirmed [facility] for 01/28/17 at 8:15am .  Instructions and contact information given.

## 2017-01-21 NOTE — Telephone Encounter (Signed)
Confirmed [facility] for 01/28/17 at 815am .  Instructions and contact information given.

## 2017-01-26 NOTE — Progress Notes (Signed)
[place]  [phone] Fax:([phone]) [place] Note   Patient Care Team: Hoyt Koch, MD as PCP - General (Internal Medicine) 01/28/2017  CHIEF COMPLAINTS/PURPOSE OF CONSULTATION:  Newly diagnosed left breast cancer  Oncology History   Cancer Staging Malignant neoplasm of upper-outer quadrant of left breast in female, estrogen receptor positive ([place]) Staging form: Breast, AJCC 8th Edition - Clinical stage from 01/15/2017: Stage IA (cT1c, cN0, cM0, G1, ER: Positive, PR: Positive, HER2: Negative) - Signed by Truitt Merle, MD on 01/27/2017       Malignant neoplasm of upper-outer quadrant of left breast in female, estrogen receptor positive ([place])   01/15/2017 Initial Biopsy    Left breast 2:00 core needle biopsy showed invasive lobular carcinoma, atypical lobular hyperplasia. Grade 1.      01/15/2017 Receptors her2    ER 90% positive, PR 90% positive, HER-2 negative, Ki-67 1%      01/15/2017 Mammogram    Bilateral diagnostic mammogram and ultrasound showed irregular hypoechoic mass in the left breast 2:00 position, 9 cm from the nipple, measuring 1.2 x 1.0 x 0.8 cm, no evidence of malignancy within the right breast, ultrasound of the left axilla was negative. Breast density category B      01/15/2017 Initial Diagnosis    Malignant neoplasm of upper-outer quadrant of left breast in female, estrogen receptor positive ([place])       HISTORY OF PRESENTING ILLNESS:  [name] 61 y.o. female is here because of recently diagnosed left breast invasive lobular carcinoma. She presents to our multidisciplinary breast clinic today.   Her cancer was discovered by screening mammogram. She has no palpable breast mass or adenopathy. She feels well, no pain or other complain. She has mild left shoulder pain since 2007. She had left hip repalcement in 2016   The patient underwent bilateral diagnostic mammogram and bilateral breast ultrasound on  01/15/2017 to evaluate areas of possible architectural distortion within each breast. These scans sowed an irregular hypoechoic mass in the left breast at the 2 o'clock axis, 9 cm from the nipple, measuring 1.2 x 0.8 x 1 cm, a probable correlate for the architectural distortion seen on mammogram. There was no evidence of malignancy within the right breast.   Biopsy of the left breast at the 2:00 o'clock position on 01/15/2017 revealed invasive lobular carcinoma, atypical lobular hyperplasia, and microcalcifications present. Pathology compatible with grade 1 breast carcinoma, ER/PR positive, HER-2 negative, Ki67 1%.  GYN HISTORY  Menarchal: 14 LMP: age of 68 Contraceptive: 24 years  HRT: no G1P1: one son, at age of 89      MEDICAL HISTORY:  Past Medical History:  Diagnosis Date  . Arthritis   . HLD (hyperlipidemia)   . Hypertension   . Hypothyroidism   . Obesity   . Overactive bladder   . Palpitation   . Thyroid disease     SURGICAL HISTORY: Past Surgical History:  Procedure Laterality Date  . bladder stem stretch  1966  . KNEE ARTHROSCOPY     LEFT  . NOSE SURGERY  1980  . SHOULDER SURGERY Left 2007  . TONSILLECTOMY  1964  . TOTAL HIP ARTHROPLASTY Left 10/23/2015   Procedure: TOTAL HIP ARTHROPLASTY ANTERIOR APPROACH;  Surgeon: Melrose Nakayama, MD;  Location: [facility];  Service: Orthopedics;  Laterality: Left;    SOCIAL HISTORY: Social History   Social History  . Marital status: Single    Spouse name: N/A  . Number of children: N/A  .  Years of education: N/A   Occupational History  . Not on file.   Social History Main Topics  . Smoking status: Never Smoker  . Smokeless tobacco: Never Used  . Alcohol use No  . Drug use: No  . Sexual activity: Not on file   Other Topics Concern  . Not on file   Social History Narrative  . No narrative on file    FAMILY HISTORY: Family History  Problem Relation Age of Onset  . Heart Problems Father     cardiac arrest  .  Heart attack Mother   . Heart disease Mother   . Breast cancer Mother     ALLERGIES:  has No Known Allergies.  MEDICATIONS:  Current Outpatient Prescriptions  Medication Sig Dispense Refill  . acetaminophen (TYLENOL) 325 MG tablet Take 650 mg by mouth every 6 (six) hours as needed for mild pain.    . clobetasol cream (TEMOVATE) 3.79 % Apply 1 application topically 2 (two) times daily as needed (itching).     . hydrochlorothiazide (HYDRODIURIL) 25 MG tablet TAKE 1 TABLET BY MOUTH EVERY DAY 30 tablet 3  . levothyroxine (SYNTHROID, LEVOTHROID) 88 MCG tablet Take 88 mcg by mouth daily. Take 2 tabs for 2 days  Alternate with 44 mcg for 1 day, then so forth.  12  . metoprolol succinate (TOPROL-XL) 25 MG 24 hr tablet TAKE 1 TABLET BY MOUTH EVERY DAY (Patient taking differently: TAKE 1/2 TABLET BY MOUTH EVERY DAY) 90 tablet 3  . pantoprazole (PROTONIX) 40 MG tablet Take 1 tablet (40 mg total) by mouth daily. Pt. needs to set up follow up appt. 30 tablet 0  . simvastatin (ZOCOR) 20 MG tablet Take 20 mg by mouth daily.     No current facility-administered medications for this visit.     REVIEW OF SYSTEMS:   Constitutional: Denies fevers, chills or abnormal night sweats Eyes: Denies blurriness of vision, double vision or watery eyes Ears, nose, mouth, throat, and face: Denies mucositis or sore throat Respiratory: Denies cough, dyspnea or wheezes Cardiovascular: Denies palpitation, chest discomfort or lower extremity swelling Gastrointestinal:  Denies nausea, heartburn or change in bowel habits Skin: Denies abnormal skin rashes Lymphatics: Denies new lymphadenopathy or easy bruising Neurological:Denies numbness, tingling or new weaknesses Behavioral/Psych: Mood is stable, no new changes  All other systems were reviewed with the patient and are negative.  PHYSICAL EXAMINATION: ECOG PERFORMANCE STATUS: 0 - Asymptomatic  Vitals:   01/28/17 0850  BP: 135/77  Pulse: 76  Resp: 18  Temp: 97.7  F (36.5 C)   Filed Weights   01/28/17 0850  Weight: 206 lb 4.8 oz (93.6 kg)    GENERAL:alert, no distress and comfortable SKIN: skin color, texture, turgor are normal, no rashes or significant lesions EYES: normal, conjunctiva are pink and non-injected, sclera clear OROPHARYNX:no exudate, no erythema and lips, buccal mucosa, and tongue normal  NECK: supple, thyroid normal size, non-tender, without nodularity LYMPH:  no palpable lymphadenopathy in the cervical, axillary or inguinal LUNGS: clear to auscultation and percussion with normal breathing effort HEART: regular rate & rhythm and no murmurs and no lower extremity edema ABDOMEN:abdomen soft, non-tender and normal bowel sounds Musculoskeletal:no cyanosis of digits and no clubbing  PSYCH: alert & oriented x 3 with fluent speech NEURO: no focal motor/sensory deficits Breasts: Breast inspection showed them to be symmetrical with no nipple discharge. Palpation of the breasts and axilla revealed no obvious mass that I could appreciate.   LABORATORY DATA:  I have reviewed  the data as listed CBC Latest Ref Rng & Units 01/28/2017 10/16/2015  WBC 3.9 - 10.3 10e3/uL 6.9 10.3  Hemoglobin 11.6 - 15.9 g/dL 13.7 14.1  Hematocrit 34.8 - 46.6 % 42.3 41.9  Platelets 145 - 400 10e3/uL 342 339    CMP Latest Ref Rng & Units 01/28/2017 10/16/2015 01/05/2015  Glucose 70 - 140 mg/dl 97 91 95  BUN 7.0 - 26.0 mg/dL 12._0 Creatinine 0.6 - 1.1 mg/dL 1.0 0.89 0.85  Sodium 136 - 145 mEq/L 140 140 139  Potassium 3.5 - 5.1 mEq/L 3.2(L) 3.1(L) 3.6  Chloride 101 - 111 mmol/L - 101 102  CO2 22 - 29 mEq/L _1 Calcium 8.4 - 10.4 mg/dL 10.0 9.9 9.7  Total Protein 6.4 - 8.3 g/dL 7.8 - -  Total Bilirubin 0.20 - 1.20 mg/dL 0.68 - -  Alkaline Phos 40 - 150 U/L 117 - -  AST 5 - 34 U/L 17 - -  ALT 0 - 55 U/L 17 - -   PATHOLOGY  Diagnosis 01/15/2017 Breast, left, needle core biopsy, 2:00 o'clock - INVASIVE LOBULAR CARCINOMA. - ATYPICAL LOBULAR  HYPERPLASIA. - MICROCALCIFICATIONS PRESENT. - SEE COMMENT. Microscopic Comment Immunohistochemical stains are performed after review of the H&E stained sections for cytokeratin AE1/AE3, calponin, smooth muscle myosin, p63 and E-Cadherin. The morphology coupled with the staining pattern is consistent with the above diagnosis. Although definitive grading of breast carcinoma is best done on excision, the features of the invasive tumor from the left breast 2 o'clock biopsy are compatible with a grade 1 breast carcinoma. Breast prognostic markers will be performed and reported in an addendum. Findings are called to the [facility] on 01/19/2017. Dr. Vicente Males has seen this case in consultation with agreement. (RH:kh 01-19-17)  PROGNOSTIC INDICATORS Estrogen Receptor: 90%, POSITIVE, STRONG STAINING INTENSITY Progesterone Receptor: 90%, POSITIVE, STRONG STAINING INTENSITY Proliferation Marker Ki67: 1% HER2 - NEGATIVE  RADIOGRAPHIC STUDIES: I have personally reviewed the radiological images as listed and agreed with the findings in the report. US Breast Ltd Uni Left Inc Axilla  Result Date: 01/15/2017 CLINICAL DATA:  Patient returns today to evaluate areas of possible architectural distortion within each breast. EXAM: 2D DIGITAL DIAGNOSTIC BILATERAL MAMMOGRAM WITH CAD AND ADJUNCT TOMO ULTRASOUND BILATERAL BREAST COMPARISON:  Previous exams including recent screening mammogram dated 01/05/2017. ACR Breast Density Category b: There are scattered areas of fibroglandular density. FINDINGS: On today's additional views of the left breast with spot compression and 3D tomosynthesis, there is persistent architectural distortion within the upper-outer quadrant of the left breast, best seen on spot compression tomosynthesis CC slice 34, true lateral slice 22 and MLO slice 43. On today's additional views of the right breast with spot compression and 3D tomosynthesis, there is no persistent mammographic  abnormality indicating superimposition of normal fibroglandular tissues. Mammographic images were processed with CAD. Targeted ultrasound is performed, evaluating the upper-outer quadrant of the left breast corresponding to the area of mammographic finding, showing an irregular hypoechoic mass at the 2 o'clock axis, 9 cm from the nipple, measuring 1.2 x 0.8 x 1 cm, a probable correlate for the mammographic finding. Targeted ultrasound was then performed, evaluating the upper-outer quadrant of the right breast corresponding to the area of findings on screening mammogram, showing only normal fibroglandular tissues and fat lobules throughout. No suspicious solid or cystic mass is identified by ultrasound. Left axilla was also evaluated with ultrasound showing no enlarged or morphologically abnormal lymph nodes. IMPRESSION: 1. Irregular hypoechoic  mass in the LEFT breast at the 2 o'clock axis, 9 cm from the nipple, measuring 1.2 x 0.8 x 1 cm, a probable correlate for the architectural distortion seen on mammogram. Ultrasound-guided biopsy is recommended. 2. No evidence of malignancy within the right breast. RECOMMENDATION: Ultrasound-guided biopsy of the LEFT breast mass. Ultrasound-guided biopsy is scheduled for later today. I have discussed the findings and recommendations with the patient. Results were also provided in writing at the conclusion of the visit. If applicable, a reminder letter will be sent to the patient regarding the next appointment. BI-RADS CATEGORY  4: Suspicious. Electronically Signed   By: Franki Cabot M.D.   On: 01/15/2017 10:48   US Breast Ltd Uni Right Inc Axilla  Result Date: 01/15/2017 CLINICAL DATA:  Patient returns today to evaluate areas of possible architectural distortion within each breast. EXAM: 2D DIGITAL DIAGNOSTIC BILATERAL MAMMOGRAM WITH CAD AND ADJUNCT TOMO ULTRASOUND BILATERAL BREAST COMPARISON:  Previous exams including recent screening mammogram dated 01/05/2017. ACR  Breast Density Category b: There are scattered areas of fibroglandular density. FINDINGS: On today's additional views of the left breast with spot compression and 3D tomosynthesis, there is persistent architectural distortion within the upper-outer quadrant of the left breast, best seen on spot compression tomosynthesis CC slice 34, true lateral slice 22 and MLO slice 43. On today's additional views of the right breast with spot compression and 3D tomosynthesis, there is no persistent mammographic abnormality indicating superimposition of normal fibroglandular tissues. Mammographic images were processed with CAD. Targeted ultrasound is performed, evaluating the upper-outer quadrant of the left breast corresponding to the area of mammographic finding, showing an irregular hypoechoic mass at the 2 o'clock axis, 9 cm from the nipple, measuring 1.2 x 0.8 x 1 cm, a probable correlate for the mammographic finding. Targeted ultrasound was then performed, evaluating the upper-outer quadrant of the right breast corresponding to the area of findings on screening mammogram, showing only normal fibroglandular tissues and fat lobules throughout. No suspicious solid or cystic mass is identified by ultrasound. Left axilla was also evaluated with ultrasound showing no enlarged or morphologically abnormal lymph nodes. IMPRESSION: 1. Irregular hypoechoic mass in the LEFT breast at the 2 o'clock axis, 9 cm from the nipple, measuring 1.2 x 0.8 x 1 cm, a probable correlate for the architectural distortion seen on mammogram. Ultrasound-guided biopsy is recommended. 2. No evidence of malignancy within the right breast. RECOMMENDATION: Ultrasound-guided biopsy of the LEFT breast mass. Ultrasound-guided biopsy is scheduled for later today. I have discussed the findings and recommendations with the patient. Results were also provided in writing at the conclusion of the visit. If applicable, a reminder letter will be sent to the patient  regarding the next appointment. BI-RADS CATEGORY  4: Suspicious. Electronically Signed   By: Franki Cabot M.D.   On: 01/15/2017 10:48   Mm Diag Breast Tomo Bilateral  Result Date: 01/15/2017 CLINICAL DATA:  Patient returns today to evaluate areas of possible architectural distortion within each breast. EXAM: 2D DIGITAL DIAGNOSTIC BILATERAL MAMMOGRAM WITH CAD AND ADJUNCT TOMO ULTRASOUND BILATERAL BREAST COMPARISON:  Previous exams including recent screening mammogram dated 01/05/2017. ACR Breast Density Category b: There are scattered areas of fibroglandular density. FINDINGS: On today's additional views of the left breast with spot compression and 3D tomosynthesis, there is persistent architectural distortion within the upper-outer quadrant of the left breast, best seen on spot compression tomosynthesis CC slice 34, true lateral slice 22 and MLO slice 43. On today's additional views of the right breast  with spot compression and 3D tomosynthesis, there is no persistent mammographic abnormality indicating superimposition of normal fibroglandular tissues. Mammographic images were processed with CAD. Targeted ultrasound is performed, evaluating the upper-outer quadrant of the left breast corresponding to the area of mammographic finding, showing an irregular hypoechoic mass at the 2 o'clock axis, 9 cm from the nipple, measuring 1.2 x 0.8 x 1 cm, a probable correlate for the mammographic finding. Targeted ultrasound was then performed, evaluating the upper-outer quadrant of the right breast corresponding to the area of findings on screening mammogram, showing only normal fibroglandular tissues and fat lobules throughout. No suspicious solid or cystic mass is identified by ultrasound. Left axilla was also evaluated with ultrasound showing no enlarged or morphologically abnormal lymph nodes. IMPRESSION: 1. Irregular hypoechoic mass in the LEFT breast at the 2 o'clock axis, 9 cm from the nipple, measuring 1.2 x 0.8 x 1  cm, a probable correlate for the architectural distortion seen on mammogram. Ultrasound-guided biopsy is recommended. 2. No evidence of malignancy within the right breast. RECOMMENDATION: Ultrasound-guided biopsy of the LEFT breast mass. Ultrasound-guided biopsy is scheduled for later today. I have discussed the findings and recommendations with the patient. Results were also provided in writing at the conclusion of the visit. If applicable, a reminder letter will be sent to the patient regarding the next appointment. BI-RADS CATEGORY  4: Suspicious. Electronically Signed   By: Franki Cabot M.D.   On: 01/15/2017 10:48   Mm Clip Placement Left  Result Date: 01/15/2017 CLINICAL DATA:  Evaluate biopsy marker EXAM: DIAGNOSTIC LEFT MAMMOGRAM POST ULTRASOUND BIOPSY COMPARISON:  Previous exam(s). FINDINGS: Mammographic images were obtained following ultrasound guided biopsy of left breast distortion. The ribbon shaped marker is at the site of distortion. IMPRESSION: Appropriate placement of biopsy clip. Final Assessment: Post Procedure Mammograms for Marker Placement Electronically Signed   By: Dorise Bullion III M.D   On: 01/15/2017 11:07   [place] Lt Breast Bx W Loc Dev 1st Lesion Img Bx Spec US Guide  Addendum Date: 01/19/2017   ADDENDUM REPORT: 01/19/2017 13:39 ADDENDUM: Pathology revealed GRADE I INVASIVE LOBULAR CARCINOMA, ATYPICAL LOBULAR HYPERPLASIA, MICROCALCIFICATIONS PRESENT of the Left breast, 2:00 o'clock. This was found to be concordant by Dr. Dorise Bullion. Pathology results were discussed with the patient by telephone. The patient reported doing well after the biopsy with tenderness and bruising at the site. Post biopsy instructions and care were reviewed and questions were answered. The patient was encouraged to call [facility] for any additional concerns. The patient was referred to [facility]  on January 28, 2017. Recommendation for MRI due to diagnosis of lobular carcinoma. Pathology results reported by Terie Purser, RN on 01/19/2017. Electronically Signed   By: Dorise Bullion III M.D   On: 01/19/2017 13:39   Result Date: 01/15/2017 CLINICAL DATA:  Left breast distortion EXAM: ULTRASOUND GUIDED LEFT BREAST CORE NEEDLE BIOPSY COMPARISON:  Previous exam(s). FINDINGS: I met with the patient and we discussed the procedure of ultrasound-guided biopsy, including benefits and alternatives. We discussed the high likelihood of a successful procedure. We discussed the risks of the procedure, including infection, bleeding, tissue injury, clip migration, and inadequate sampling. Informed written consent was given. The usual time-out protocol was performed immediately prior to the procedure. Using sterile technique and 1% Lidocaine as local anesthetic, under direct ultrasound visualization, a 12 gauge spring-loaded device was used to perform biopsy of left breast distortion using  a lateral approach. At the conclusion of the procedure a ribbon shaped tissue marker clip was deployed into the biopsy cavity. Follow up 2 view mammogram was performed and dictated separately. IMPRESSION: Ultrasound guided biopsy of left breast distortion. No apparent complications. Electronically Signed: By: Dorise Bullion III M.D On: 01/15/2017 10:32    ASSESSMENT & PLAN: 61 year old postmenopausal woman, presented with screening discovered left breast cancer.  1. Malignant neoplasm of upper-outer quadrant of left breast, invasive lobular carcinoma, grade 1, cT1cN0M0, ER+/PR+/HER2- ---We discussed her imaging findings and the biopsy results in great details. -Giving the early stage disease, she likely need a candidate for breast conservation surgery. She is agreeable with that. She was seen by Dr. Donne Hazel today and likely will proceed with lumpectomy and sentinel lymph node biopsy surgery soon.  -I recommend a Oncotype Dx test  on the surgical sample and we'll make a decision about adjuvant chemotherapy based on the Oncotype result. Written material of this test was given to her. She is young and fit, would be a good candidate for chemotherapy if her Oncotype recurrence score is high. -Giving the lobular histology, low-grade tumor, I suspect this is likely a low risk disease. -If her surgical sentinel lymph node node positive, I recommend mammaprint for further risk stratification and guide adjuvant chemotherapy. -Giving the strong ER and PR expression in her postmenopausal status, I recommend adjuvant endocrine therapy with aromatase inhibitor for a total of 5-10 years to reduce the risk of cancer recurrence.  -The potential benefit and side effects of AI, which includes but not limited to, hot flash, skin and vaginal dryness, metabolic changes ( increased blood glucose, cholesterol, weight, etc.), slightly in increased risk of cardiovascular disease, cataracts, muscular and joint discomfort, osteopenia and osteoporosis, etc, were discussed with her in great details. She is interested, and we'll start after she completes radiation. -She was also seen by radiation oncologist Dr. Lisbeth Renshaw today. She would benefit from adjuvant breast radiation after lumpectomy. -We also discussed the breast cancer surveillance after her surgery. She will continue annual screening mammogram, self exam, and a routine office visit with lab and exam with [place]. -I encouraged her to have healthy diet and exercise regularly.   2. Hypertension and hypercholesterolemia -She'll follow-up with her primary care physician  3. Arthritis -f/u with PCP   Plan: -She'll proceed with left breast lumpectomy and sentinel lymph node biopsy soon -If tumor more than 1cm on surgical path, will order Oncotype if the node negative, or mammaprint if node positive -I'll plan to see her after her breast radiation to finalize her adjuvant antiestrogen therapy, or sooner if  her Oncotype shows high risk disease.   All questions were answered. The patient knows to call the clinic with any problems, questions or concerns. I spent 55 minutes counseling the patient face to face. The total time spent in the appointment was 60 minutes and more than 50% was on counseling.   Truitt Merle, MD 01/28/2017 10:21 AM

## 2017-01-28 ENCOUNTER — Encounter: Payer: Self-pay | Admitting: *Deleted

## 2017-01-28 ENCOUNTER — Ambulatory Visit (HOSPITAL_BASED_OUTPATIENT_CLINIC_OR_DEPARTMENT_OTHER): Payer: Commercial Managed Care - PPO | Admitting: Hematology

## 2017-01-28 ENCOUNTER — Encounter: Payer: Self-pay | Admitting: Hematology

## 2017-01-28 ENCOUNTER — Other Ambulatory Visit (HOSPITAL_BASED_OUTPATIENT_CLINIC_OR_DEPARTMENT_OTHER): Payer: Commercial Managed Care - PPO

## 2017-01-28 ENCOUNTER — Ambulatory Visit
Admission: RE | Admit: 2017-01-28 | Discharge: 2017-01-28 | Disposition: A | Discharge status: Home / Self Care | Payer: Commercial Managed Care - PPO | Source: Ambulatory Visit | Attending: Radiation Oncology | Admitting: Radiation Oncology

## 2017-01-28 ENCOUNTER — Other Ambulatory Visit: Payer: Self-pay | Admitting: General Surgery

## 2017-01-28 ENCOUNTER — Ambulatory Visit: Payer: Commercial Managed Care - PPO | Attending: General Surgery | Admitting: Physical Therapy

## 2017-01-28 ENCOUNTER — Encounter: Payer: Self-pay | Admitting: Physical Therapy

## 2017-01-28 ENCOUNTER — Encounter: Payer: Self-pay | Admitting: Radiation Oncology

## 2017-01-28 VITALS — BP 135/77 | HR 76 | Temp 97.7°F | Resp 18 | Ht 66.0 in | Wt 206.3 lb

## 2017-01-28 DIAGNOSIS — Z170 Estrogen receptor positive status [ER+]: Principal | ICD-10-CM

## 2017-01-28 DIAGNOSIS — C50412 Malignant neoplasm of upper-outer quadrant of left female breast: Secondary | ICD-10-CM

## 2017-01-28 DIAGNOSIS — G8929 Other chronic pain: Secondary | ICD-10-CM | POA: Diagnosis present

## 2017-01-28 DIAGNOSIS — M25512 Pain in left shoulder: Secondary | ICD-10-CM | POA: Insufficient documentation

## 2017-01-28 DIAGNOSIS — I10 Essential (primary) hypertension: Secondary | ICD-10-CM

## 2017-01-28 DIAGNOSIS — E7800 Pure hypercholesterolemia, unspecified: Secondary | ICD-10-CM

## 2017-01-28 DIAGNOSIS — R293 Abnormal posture: Secondary | ICD-10-CM

## 2017-01-28 DIAGNOSIS — M25612 Stiffness of left shoulder, not elsewhere classified: Secondary | ICD-10-CM

## 2017-01-28 LAB — CBC WITH DIFFERENTIAL/PLATELET
BASO%: 0.4 % (ref 0.0–2.0)
BASOS ABS: 0 10*3/uL (ref 0.0–0.1)
EOS ABS: 0.3 10*3/uL (ref 0.0–0.5)
EOS%: 3.6 % (ref 0.0–7.0)
HEMATOCRIT: 42.3 % (ref 34.8–46.6)
HEMOGLOBIN: 13.7 g/dL (ref 11.6–15.9)
LYMPH#: 2.3 10*3/uL (ref 0.9–3.3)
LYMPH%: 32.7 % (ref 14.0–49.7)
MCH: 28.5 pg (ref 25.1–34.0)
MCHC: 32.4 g/dL (ref 31.5–36.0)
MCV: 87.9 fL (ref 79.5–101.0)
MONO#: 0.6 10*3/uL (ref 0.1–0.9)
MONO%: 8.8 % (ref 0.0–14.0)
NEUT#: 3.8 10*3/uL (ref 1.5–6.5)
NEUT%: 54.5 % (ref 38.4–76.8)
PLATELETS: 342 10*3/uL (ref 145–400)
RBC: 4.81 10*6/uL (ref 3.70–5.45)
RDW: 14.2 % (ref 11.2–14.5)
WBC: 6.9 10*3/uL (ref 3.9–10.3)

## 2017-01-28 LAB — COMPREHENSIVE METABOLIC PANEL
ALBUMIN: 3.8 g/dL (ref 3.5–5.0)
ALK PHOS: 117 U/L (ref 40–150)
ALT: 17 U/L (ref 0–55)
ANION GAP: 10 meq/L (ref 3–11)
AST: 17 U/L (ref 5–34)
BUN: 12.4 mg/dL (ref 7.0–26.0)
CALCIUM: 10 mg/dL (ref 8.4–10.4)
CO2: 28 mEq/L (ref 22–29)
Chloride: 102 mEq/L (ref 98–109)
Creatinine: 1 mg/dL (ref 0.6–1.1)
EGFR: 61 mL/min/{1.73_m2} — ABNORMAL LOW (ref >90)
Glucose: 97 mg/dl (ref 70–140)
POTASSIUM: 3.2 meq/L — AB (ref 3.5–5.1)
Sodium: 140 mEq/L (ref 136–145)
Total Bilirubin: 0.68 mg/dL (ref 0.20–1.20)
Total Protein: 7.8 g/dL (ref 6.4–8.3)

## 2017-01-28 NOTE — Progress Notes (Signed)
Clinical Social Work [place] Psychosocial Distress Screening [place]  Patient completed distress screening protocol and scored a 2 on the Psychosocial Distress Thermometer which indicates mild distress. Clinical Social Worker met with patient and patients family in [facility] to assess for distress and other psychosocial needs. Patient stated she was feeling overwhelmed but felt "better" after meeting with the treatment team and getting more information on her treatment plan. CSW and patient discussed common feeling and emotions when being diagnosed with cancer, and the importance of support during treatment. CSW informed patient of the support team and support services at [facility], and patient was agreeable to an [employer]. CSW provided contact information and encouraged patient to call with any questions or concerns.   ONCBCN DISTRESS SCREENING 01/28/2017  Screening Type Initial Screening  Distress experienced in past week (1-10) 2  Practical problem type Work/school  Emotional problem type Nervousness/Anxiety;Adjusting to illness  Information Concerns Type Lack of info about treatment  Referral to support programs Yes    Johnnye Lana, MSW, LCSW, OSW-C Clinical Social Worker [facility] [phone]

## 2017-01-28 NOTE — Patient Instructions (Signed)

## 2017-01-28 NOTE — Progress Notes (Signed)
Nutrition Assessment  Reason for Assessment:  Pt seen in Breast Clinic  ASSESSMENT:   61 year old female with new diagnosis of breast cancer.   Past medical history of HTN, elevated blood glucose per patient, HLD   Medications:  reviewed  Labs: reviewed  Anthropometrics:   Height: 66 inches Weight: 206 pounds BMI: 33.4   NUTRITION DIAGNOSIS: Food and nutrition related knowledge deficit related to new diagnosis of breast cancer as evidenced by no prior need for nutrition related information.  INTERVENTION:   Discussed and provided packet of information regarding nutritional tips for breast cancer patients.  Questions answered.  Teachback method used.      MONITORING, EVALUATION, and GOAL: Pt will consume a healthy plant based diet to maintain lean body mass throughout treatment.   Cato Liburd B. Zenia Resides, [place], [place] (pager)

## 2017-01-28 NOTE — Progress Notes (Signed)
Radiation Oncology         [phone] ________________________________  Name: [name] MRN: [id number]  Date: 01/28/2017  DOB: 1956/10/06  CH:ENIDPOEUM Mallory Muse [name], MD  Rolm Bookbinder, MD     REFERRING PHYSICIAN: Rolm Bookbinder, MD   DIAGNOSIS: The encounter diagnosis was Malignant neoplasm of upper-outer quadrant of left breast in female, estrogen receptor positive ([place]).   HISTORY OF PRESENT ILLNESS: [name] is a 61 y.o. female seen in the multidisciplinary breast clinic for a new diagnosis of left breast cancer. She was found to have a distortion on her screening mammography. She subsequently underwent diagnostic imaging and this revealed a 1.2 x .8 x 1 cm mass in the left breast. The axillary ultrasound was negative for cortical thickening. She underwent left breast biopsy on 01/15/17 which revealed focal grade 1 invasive lobular carcinoma and hyperplasia, ER/PR positive, HER2 not amplified, Ki-67% was <5%. She comes today to discuss options of treatment for her cancer.    PREVIOUS RADIATION THERAPY: No   PAST MEDICAL HISTORY:  Past Medical History:  Diagnosis Date  . Arthritis   . HLD (hyperlipidemia)   . Hypothyroidism   . Obesity   . Overactive bladder   . Palpitation   . Thyroid disease        PAST SURGICAL HISTORY: Past Surgical History:  Procedure Laterality Date  . bladder stem stretch  1966  . KNEE ARTHROSCOPY     LEFT  . NOSE SURGERY  1980  . SHOULDER SURGERY Left 2007  . TONSILLECTOMY  1964  . TOTAL HIP ARTHROPLASTY Left 10/23/2015   Procedure: TOTAL HIP ARTHROPLASTY ANTERIOR APPROACH;  Surgeon: Melrose Nakayama, MD;  Location: [place];  Service: Orthopedics;  Laterality: Left;     FAMILY HISTORY:  Family History  Problem Relation Age of Onset  . Heart Problems Father     cardiac arrest  . Heart attack Mother   . Heart disease Mother   . Breast cancer Mother      SOCIAL HISTORY:  reports that she has never smoked. She has never used  smokeless tobacco. She reports that she does not drink alcohol or use drugs. The patient is single and lives in [place]. She works for a [employer].    ALLERGIES: Patient has no known allergies.   MEDICATIONS:  Current Outpatient Prescriptions  Medication Sig Dispense Refill  . acetaminophen (TYLENOL) 325 MG tablet Take 650 mg by mouth every 6 (six) hours as needed for mild pain.    . clobetasol cream (TEMOVATE) 3.53 % Apply 1 application topically 2 (two) times daily as needed (itching).     . hydrochlorothiazide (HYDRODIURIL) 25 MG tablet TAKE 1 TABLET BY MOUTH EVERY DAY 30 tablet 3  . levothyroxine (SYNTHROID, LEVOTHROID) 88 MCG tablet Take 88 mcg by mouth daily. Take 2 tabs for 2 days  Alternate with 44 mcg for 1 day, then so forth.  12  . metoprolol succinate (TOPROL-XL) 25 MG 24 hr tablet TAKE 1 TABLET BY MOUTH EVERY DAY (Patient taking differently: TAKE 1/2 TABLET BY MOUTH EVERY DAY) 90 tablet 3  . pantoprazole (PROTONIX) 40 MG tablet Take 1 tablet (40 mg total) by mouth daily. Pt. needs to set up follow up appt. 30 tablet 0  . simvastatin (ZOCOR) 20 MG tablet Take 20 mg by mouth daily.     No current facility-administered medications for this encounter.      REVIEW OF SYSTEMS: On review of systems, the patient reports that she  is doing well overall. She denies any chest pain, shortness of breath, cough, fevers, chills, night sweats, unintended weight changes. She denies any bowel or bladder disturbances, and denies abdominal pain, nausea or vomiting. She denies any new musculoskeletal or joint aches or pains. A complete review of systems is obtained and is otherwise negative.     PHYSICAL EXAM:  Wt Readings from Last 3 Encounters:  01/28/17 206 lb 4.8 oz (93.6 kg)  09/08/16 218 lb (98.9 kg)  10/23/15 207 lb (93.9 kg)   Temp Readings from Last 3 Encounters:  01/28/17 97.7 F (36.5 C) (Oral)  09/08/16 98.1 F (36.7 C) (Oral)  10/25/15 98.4 F (36.9 C) (Oral)   BP  Readings from Last 3 Encounters:  01/28/17 135/77  09/08/16 138/90  10/25/15 (!) 119/54   Pulse Readings from Last 3 Encounters:  01/28/17 76  09/08/16 66  10/25/15 83    /10  In general this is a well appearing Caucasian female in no acute distress. She is alert and oriented x4 and appropriate throughout the examination. HEENT reveals that the patient is normocephalic, atraumatic. EOMs are intact. PERRLA. Skin is intact without any evidence of gross lesions. Cardiovascular exam reveals a regular rate and rhythm, no clicks rubs or murmurs are auscultated. Chest is clear to auscultation bilaterally. Lymphatic assessment is performed and does not reveal any adenopathy in the cervical, supraclavicular, axillary, or inguinal chains. Bilateral breast exam reveals no palpable abnormalities of either breast, there is interestingly mild bruising of the right breast. No nipple bleeding or discharge is noted.  Abdomen has active bowel sounds in all quadrants and is intact. The abdomen is soft, non tender, non distended. Lower extremities are negative for pretibial pitting edema, deep calf tenderness, cyanosis or clubbing.   ECOG = 0  0 - Asymptomatic (Fully active, able to carry on all predisease activities without restriction)  1 - Symptomatic but completely ambulatory (Restricted in physically strenuous activity but ambulatory and able to carry out work of a light or sedentary nature. For example, light housework, office work)  2 - Symptomatic, <50% in bed during the day (Ambulatory and capable of all self care but unable to carry out any work activities. Up and about more than 50% of waking hours)  3 - Symptomatic, >50% in bed, but not bedbound (Capable of only limited self-care, confined to bed or chair 50% or more of waking hours)  4 - Bedbound (Completely disabled. Cannot carry on any self-care. Totally confined to bed or chair)  5 - Death   Eustace Pen MM, Creech RH, Tormey DC, et al. [phone]).  "Toxicity and response criteria of the [facility]". [place] Oncol. 5 (6): 649-55    LABORATORY DATA:  Lab Results  Component Value Date   WBC 6.9 01/28/2017   HGB 13.7 01/28/2017   HCT 42.3 01/28/2017   MCV 87.9 01/28/2017   PLT 342 01/28/2017   Lab Results  Component Value Date   NA 140 01/28/2017   K 3.2 (L) 01/28/2017   CL 101 10/16/2015   CO2 28 01/28/2017   Lab Results  Component Value Date   ALT 17 01/28/2017   AST 17 01/28/2017   ALKPHOS 117 01/28/2017   BILITOT 0.68 01/28/2017      RADIOGRAPHY: US Breast Ltd Uni Left Inc Axilla  Result Date: 01/15/2017 CLINICAL DATA:  Patient returns today to evaluate areas of possible architectural distortion within each breast. EXAM: 2D DIGITAL DIAGNOSTIC BILATERAL MAMMOGRAM WITH CAD AND  ADJUNCT TOMO ULTRASOUND BILATERAL BREAST COMPARISON:  Previous exams including recent screening mammogram dated 01/05/2017. ACR Breast Density Category b: There are scattered areas of fibroglandular density. FINDINGS: On today's additional views of the left breast with spot compression and 3D tomosynthesis, there is persistent architectural distortion within the upper-outer quadrant of the left breast, best seen on spot compression tomosynthesis CC slice 34, true lateral slice 22 and MLO slice 43. On today's additional views of the right breast with spot compression and 3D tomosynthesis, there is no persistent mammographic abnormality indicating superimposition of normal fibroglandular tissues. Mammographic images were processed with CAD. Targeted ultrasound is performed, evaluating the upper-outer quadrant of the left breast corresponding to the area of mammographic finding, showing an irregular hypoechoic mass at the 2 o'clock axis, 9 cm from the nipple, measuring 1.2 x 0.8 x 1 cm, a probable correlate for the mammographic finding. Targeted ultrasound was then performed, evaluating the upper-outer quadrant of the right breast  corresponding to the area of findings on screening mammogram, showing only normal fibroglandular tissues and fat lobules throughout. No suspicious solid or cystic mass is identified by ultrasound. Left axilla was also evaluated with ultrasound showing no enlarged or morphologically abnormal lymph nodes. IMPRESSION: 1. Irregular hypoechoic mass in the LEFT breast at the 2 o'clock axis, 9 cm from the nipple, measuring 1.2 x 0.8 x 1 cm, a probable correlate for the architectural distortion seen on mammogram. Ultrasound-guided biopsy is recommended. 2. No evidence of malignancy within the right breast. RECOMMENDATION: Ultrasound-guided biopsy of the LEFT breast mass. Ultrasound-guided biopsy is scheduled for later today. I have discussed the findings and recommendations with the patient. Results were also provided in writing at the conclusion of the visit. If applicable, a reminder letter will be sent to the patient regarding the next appointment. BI-RADS CATEGORY  4: Suspicious. Electronically Signed   By: Franki Cabot M.D.   On: 01/15/2017 10:48   US Breast Ltd Uni Right Inc Axilla  Result Date: 01/15/2017 CLINICAL DATA:  Patient returns today to evaluate areas of possible architectural distortion within each breast. EXAM: 2D DIGITAL DIAGNOSTIC BILATERAL MAMMOGRAM WITH CAD AND ADJUNCT TOMO ULTRASOUND BILATERAL BREAST COMPARISON:  Previous exams including recent screening mammogram dated 01/05/2017. ACR Breast Density Category b: There are scattered areas of fibroglandular density. FINDINGS: On today's additional views of the left breast with spot compression and 3D tomosynthesis, there is persistent architectural distortion within the upper-outer quadrant of the left breast, best seen on spot compression tomosynthesis CC slice 34, true lateral slice 22 and MLO slice 43. On today's additional views of the right breast with spot compression and 3D tomosynthesis, there is no persistent mammographic abnormality  indicating superimposition of normal fibroglandular tissues. Mammographic images were processed with CAD. Targeted ultrasound is performed, evaluating the upper-outer quadrant of the left breast corresponding to the area of mammographic finding, showing an irregular hypoechoic mass at the 2 o'clock axis, 9 cm from the nipple, measuring 1.2 x 0.8 x 1 cm, a probable correlate for the mammographic finding. Targeted ultrasound was then performed, evaluating the upper-outer quadrant of the right breast corresponding to the area of findings on screening mammogram, showing only normal fibroglandular tissues and fat lobules throughout. No suspicious solid or cystic mass is identified by ultrasound. Left axilla was also evaluated with ultrasound showing no enlarged or morphologically abnormal lymph nodes. IMPRESSION: 1. Irregular hypoechoic mass in the LEFT breast at the 2 o'clock axis, 9 cm from the nipple, measuring 1.2 x 0.8 x  1 cm, a probable correlate for the architectural distortion seen on mammogram. Ultrasound-guided biopsy is recommended. 2. No evidence of malignancy within the right breast. RECOMMENDATION: Ultrasound-guided biopsy of the LEFT breast mass. Ultrasound-guided biopsy is scheduled for later today. I have discussed the findings and recommendations with the patient. Results were also provided in writing at the conclusion of the visit. If applicable, a reminder letter will be sent to the patient regarding the next appointment. BI-RADS CATEGORY  4: Suspicious. Electronically Signed   By: Franki Cabot M.D.   On: 01/15/2017 10:48   Mm Diag Breast Tomo Bilateral  Result Date: 01/15/2017 CLINICAL DATA:  Patient returns today to evaluate areas of possible architectural distortion within each breast. EXAM: 2D DIGITAL DIAGNOSTIC BILATERAL MAMMOGRAM WITH CAD AND ADJUNCT TOMO ULTRASOUND BILATERAL BREAST COMPARISON:  Previous exams including recent screening mammogram dated 01/05/2017. ACR Breast Density  Category b: There are scattered areas of fibroglandular density. FINDINGS: On today's additional views of the left breast with spot compression and 3D tomosynthesis, there is persistent architectural distortion within the upper-outer quadrant of the left breast, best seen on spot compression tomosynthesis CC slice 34, true lateral slice 22 and MLO slice 43. On today's additional views of the right breast with spot compression and 3D tomosynthesis, there is no persistent mammographic abnormality indicating superimposition of normal fibroglandular tissues. Mammographic images were processed with CAD. Targeted ultrasound is performed, evaluating the upper-outer quadrant of the left breast corresponding to the area of mammographic finding, showing an irregular hypoechoic mass at the 2 o'clock axis, 9 cm from the nipple, measuring 1.2 x 0.8 x 1 cm, a probable correlate for the mammographic finding. Targeted ultrasound was then performed, evaluating the upper-outer quadrant of the right breast corresponding to the area of findings on screening mammogram, showing only normal fibroglandular tissues and fat lobules throughout. No suspicious solid or cystic mass is identified by ultrasound. Left axilla was also evaluated with ultrasound showing no enlarged or morphologically abnormal lymph nodes. IMPRESSION: 1. Irregular hypoechoic mass in the LEFT breast at the 2 o'clock axis, 9 cm from the nipple, measuring 1.2 x 0.8 x 1 cm, a probable correlate for the architectural distortion seen on mammogram. Ultrasound-guided biopsy is recommended. 2. No evidence of malignancy within the right breast. RECOMMENDATION: Ultrasound-guided biopsy of the LEFT breast mass. Ultrasound-guided biopsy is scheduled for later today. I have discussed the findings and recommendations with the patient. Results were also provided in writing at the conclusion of the visit. If applicable, a reminder letter will be sent to the patient regarding the next  appointment. BI-RADS CATEGORY  4: Suspicious. Electronically Signed   By: Franki Cabot M.D.   On: 01/15/2017 10:48   Mm Clip Placement Left  Result Date: 01/15/2017 CLINICAL DATA:  Evaluate biopsy marker EXAM: DIAGNOSTIC LEFT MAMMOGRAM POST ULTRASOUND BIOPSY COMPARISON:  Previous exam(s). FINDINGS: Mammographic images were obtained following ultrasound guided biopsy of left breast distortion. The ribbon shaped marker is at the site of distortion. IMPRESSION: Appropriate placement of biopsy clip. Final Assessment: Post Procedure Mammograms for Marker Placement Electronically Signed   By: Dorise Bullion III M.D   On: 01/15/2017 11:07   [place] Lt Breast Bx W Loc Dev 1st Lesion Img Bx Spec US Guide  Addendum Date: 01/19/2017   ADDENDUM REPORT: 01/19/2017 13:39 ADDENDUM: Pathology revealed GRADE I INVASIVE LOBULAR CARCINOMA, ATYPICAL LOBULAR HYPERPLASIA, MICROCALCIFICATIONS PRESENT of the Left breast, 2:00 o'clock. This was found to be concordant by Dr. Dorise Bullion. Pathology results were discussed  with the patient by telephone. The patient reported doing well after the biopsy with tenderness and bruising at the site. Post biopsy instructions and care were reviewed and questions were answered. The patient was encouraged to call [facility] for any additional concerns. The patient was referred to [facility] on January 28, 2017. Recommendation for MRI due to diagnosis of lobular carcinoma. Pathology results reported by Terie Purser, RN on 01/19/2017. Electronically Signed   By: Dorise Bullion III M.D   On: 01/19/2017 13:39   Result Date: 01/15/2017 CLINICAL DATA:  Left breast distortion EXAM: ULTRASOUND GUIDED LEFT BREAST CORE NEEDLE BIOPSY COMPARISON:  Previous exam(s). FINDINGS: I met with the patient and we discussed the procedure of ultrasound-guided biopsy, including benefits and alternatives. We  discussed the high likelihood of a successful procedure. We discussed the risks of the procedure, including infection, bleeding, tissue injury, clip migration, and inadequate sampling. Informed written consent was given. The usual time-out protocol was performed immediately prior to the procedure. Using sterile technique and 1% Lidocaine as local anesthetic, under direct ultrasound visualization, a 12 gauge spring-loaded device was used to perform biopsy of left breast distortion using a lateral approach. At the conclusion of the procedure a ribbon shaped tissue marker clip was deployed into the biopsy cavity. Follow up 2 view mammogram was performed and dictated separately. IMPRESSION: Ultrasound guided biopsy of left breast distortion. No apparent complications. Electronically Signed: By: Dorise Bullion III M.D On: 01/15/2017 10:32       IMPRESSION/PLAN: 1. Stage IA, cT1c, N0, ER/ PR positive invasive ductal carcinoma of the left breast. Dr. Lisbeth Renshaw discusses the pathology findings and reviews the nature of invasive disease. The consensus from the breast conference includes proceeding with an MRI and then proceeding with breast conservation in the form of lumpectomy with sentinel mapping. Oncotype testing will be performed on the final specimen if the tumor is greater than 10 mm. Provided that chemotherapy is not indicated, the patient's course would then be followed by external radiotherapy to the breast followed by antiestrogen therapy. We discussed the risks, benefits, short, and long term effects of radiotherapy, and the patient is interested in proceeding. Dr. Lisbeth Renshaw discusses the delivery and logistics of radiotherapy. Her course would be between 4- 6 1/2 weeks, and will be determined by final pathology and whether or not chemotherapy is necessary. We also discussed consideration of breath hold technique due to the left sided disease. We will see her back about 2 weeks after surgery to move forward with  the simulation and planning process and anticipate starting radiotherapy about 4 weeks after surgery.    The above documentation reflects my direct findings during this shared patient visit. Please see the separate note by Dr. Lisbeth Renshaw on this date for the remainder of the patient's plan of care.    Carola Rhine, PAC

## 2017-01-28 NOTE — Therapy (Signed)
[place], [place], [address] Phone: [phone]   Fax:  [phone]  Physical Therapy Evaluation  Patient Details  Name: [name] MRN: [id number] Date of Birth: [birth year] Referring Provider: Dr. Rolm Bookbinder  Encounter Date: 01/28/2017      PT End of Session - 01/28/17 1328    Visit Number 1   Number of Visits 1   PT Start Time 1000   PT Stop Time 3154  Also saw pt from 1040-1050 and 1135-1143 for a total of 30 minutes   PT Time Calculation (min) 12 min   Activity Tolerance Patient tolerated treatment well   Behavior During Therapy [facility] for tasks assessed/performed      Past Medical History:  Diagnosis Date  . Arthritis   . HLD (hyperlipidemia)   . Hypertension   . Hypothyroidism   . Obesity   . Overactive bladder   . Palpitation   . Thyroid disease     Past Surgical History:  Procedure Laterality Date  . bladder stem stretch  1966  . KNEE ARTHROSCOPY     LEFT  . NOSE SURGERY  1980  . SHOULDER SURGERY Left 2007  . TONSILLECTOMY  1964  . TOTAL HIP ARTHROPLASTY Left 10/23/2015   Procedure: TOTAL HIP ARTHROPLASTY ANTERIOR APPROACH;  Surgeon: Melrose Nakayama, MD;  Location: [facility];  Service: Orthopedics;  Laterality: Left;    There were no vitals filed for this visit.       Subjective Assessment - 01/28/17 1329    Subjective Patient reports she is here to be seen by her medical team for her newly diagnosed left breast cancer.   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 It measures 1.2 cm located in the left upper outer quadrant. It is ER/PR positive and HER2 negative with a Ki67 of 5%. She also has a history of a left rotator cuff repair from 2007. She fell on her left arm 03/2016 reinjuring it but has not gotten treatment to address that.   Patient Stated Goals Reduce  lymphedema risk and learn post op shoulder ROM HEP            [facility] PT Assessment - 01/28/17 0001      Assessment   Medical Diagnosis Left breast cancer   Referring Provider Dr. Rolm Bookbinder   Onset Date/Surgical Date 01/05/17   Hand Dominance Right   Prior Therapy none     Precautions   Precautions Other (comment)   Precaution Comments active cancer     Restrictions   Weight Bearing Restrictions No     Balance Screen   Has the patient fallen in the past 6 months No   Has the patient had a decrease in activity level because of a fear of falling?  No   Is the patient reluctant to leave their home because of a fear of falling?  No     Home Environment   Living Environment Private residence   Living Arrangements Children  59 y.o. daughter   Available Help at Discharge Family     Prior Function   Level of Independence Independent   Vocation Full time employment   Vocation Requirements Admin Asst   Leisure She does not exercise     Cognition   Overall Cognitive Status Within Functional Limits for tasks assessed     Posture/Postural Control   Posture/Postural  Control Postural limitations   Postural Limitations Rounded Shoulders;Forward head     ROM / Strength   AROM / PROM / Strength AROM;Strength     AROM   AROM Assessment Site Shoulder;Cervical   Right/Left Shoulder Right;Left   Right Shoulder Extension 39 Degrees   Right Shoulder Flexion 148 Degrees   Right Shoulder ABduction 165 Degrees   Right Shoulder Internal Rotation 64 Degrees   Right Shoulder External Rotation 79 Degrees   Left Shoulder Extension 39 Degrees   Left Shoulder Flexion 130 Degrees  with c/o pain   Left Shoulder ABduction 168 Degrees   Left Shoulder Internal Rotation 68 Degrees  Painful arc with this motion   Left Shoulder External Rotation 72 Degrees   Cervical Flexion WNL   Cervical Extension WNL   Cervical - Right Side Bend 25% limited   Cervical - Left Side Bend 25% limited    Cervical - Right Rotation 25% limited   Cervical - Left Rotation 25% limited     Strength   Overall Strength Within functional limits for tasks performed           LYMPHEDEMA/ONCOLOGY QUESTIONNAIRE - 01/28/17 1326      Type   Cancer Type Left breast cancer     Lymphedema Assessments   Lymphedema Assessments Upper extremities     Right Upper Extremity Lymphedema   10 cm Proximal to Olecranon Process 32.9 cm   Olecranon Process 28.6 cm   10 cm Proximal to Ulnar Styloid Process 24.9 cm   Just Proximal to Ulnar Styloid Process 18.1 cm   Across Hand at [employer] 20.1 cm   At [place] of 2nd Digit 6.7 cm     Left Upper Extremity Lymphedema   10 cm Proximal to Olecranon Process 31.6 cm   Olecranon Process 28 cm   10 cm Proximal to Ulnar Styloid Process 24.2 cm   Just Proximal to Ulnar Styloid Process 17.8 cm   Across Hand at [employer] 19.8 cm   At [place] of 2nd Digit 6.8 cm      Patient was instructed today in a home exercise program today for post op shoulder range of motion. These included active assist shoulder flexion in sitting, scapular retraction, wall walking with shoulder abduction, and hands behind head external rotation.  She was encouraged to do these twice a day, holding 3 seconds and repeating 5 times when permitted by her physician.          PT Education - 01/28/17 1328    Education provided Yes   Education Details Lymphedema risk reduction and post op shoulder ROM HEP   Person(s) Educated Patient;Child(ren)   Methods Explanation;Demonstration;Handout   Comprehension Returned demonstration;Verbalized understanding              Breast Clinic Goals - 01/28/17 1337      Patient will be able to verbalize understanding of pertinent lymphedema risk reduction practices relevant to her diagnosis specifically related to skin care.   Time 1   Period Days   Status Achieved     Patient will be able to return demonstrate and/or verbalize  understanding of the post-op home exercise program related to regaining shoulder range of motion.   Time 1   Period Days   Status Achieved     Patient will be able to verbalize understanding of the importance of attending the postoperative After Breast Cancer Class for further lymphedema risk reduction education and therapeutic exercise.   Time 1  Period Days   Status Achieved              Plan - 01/28/17 1332    Clinical Impression Statement Patient was diagnosed on 01/05/17 with left grade 1 invasive lobular carcinoma breast cancer. It measures 1.2 cm located in the left upper outer quadrant. It is ER/PR positive and HER2 negative with a Ki67 of 5%. She also has a history of a left rotator cuff repair from 2007. She fell on her left arm 03/2016 reinjuring it but has not gotten treatment to address that. We discussed physical therapy to try to treat her shoulder deficits and she will call us after surgery which is planned for next week.  Her multidisciplinary medical team met prior to her assessments to determine a recommended treatment plan.  She is planning to have a left lumpectomy and sentinel node biopsy followed by radiation and anti-estrogen therapy.  She will benefit from post op PT to address pre-existing shoulder issues, regain shoulder ROM, and reduce lymphedema risk. Due to her previous rotator cuff repair on the side of her breast cancer with residual deficits, her eval is of moderate complexity.   Rehab Potential Excellent   Clinical Impairments Affecting Rehab Potential none   PT Frequency One time visit   PT Treatment/Interventions Patient/family education;Therapeutic exercise   PT Next Visit Plan Will f/u after surgery to initiate rehab for left shoulder deficits   PT Home Exercise Plan Post op shoulder ROM HEP   Consulted and Agree with Plan of Care Patient;Family member/caregiver   Family Member Consulted son and daughter     Patient will follow up at outpatient  cancer rehab if needed following surgery.  If the patient requires physical therapy at that time, a specific plan will be dictated and sent to the referring physician for approval. The patient was educated today on appropriate basic range of motion exercises to begin post operatively and the importance of attending the After Breast Cancer class following surgery.  Patient was educated today on lymphedema risk reduction practices as it pertains to recommendations that will benefit the patient immediately following surgery.  She verbalized good understanding.  No additional physical therapy is indicated at this time.     Patient will benefit from skilled therapeutic intervention in order to improve the following deficits and impairments:  Postural dysfunction, Decreased knowledge of precautions, Pain, Impaired UE functional use, Decreased range of motion  Visit Diagnosis: Abnormal posture - Plan: PT plan of care cert/re-cert  Carcinoma of upper-outer quadrant of left breast in female, estrogen receptor positive ([place]) - Plan: PT plan of care cert/re-cert  Chronic left shoulder pain - Plan: PT plan of care cert/re-cert  Stiffness of left shoulder, not elsewhere classified - Plan: PT plan of care cert/re-cert     Problem List Patient Active Problem List   Diagnosis Date Noted  . Malignant neoplasm of upper-outer quadrant of left breast in female, estrogen receptor positive ([place]) 01/21/2017  . Achilles tendinitis of left lower extremity 09/08/2016  . Primary osteoarthritis of left hip 10/23/2015  . Arthritis of left hip 06/19/2015  . Hip flexor tendinitis 05/29/2015  . Essential hypertension 05/11/2015  . Hypothyroidism 05/11/2015  . Routine general medical examination at a health care facility 05/11/2015  . Obesity 05/11/2015  . Palpitations 01/03/2014  . Chest pain 01/03/2014  . Hyperlipidemia 01/03/2014    Annia Friendly, PT 01/28/17 2:10 PM  [place] [place], [place], [address]  Phone: [phone]   Fax:  [phone]  Name: [name] MRN: [id number] Date of Birth: 1956/07/30

## 2017-01-30 ENCOUNTER — Encounter: Payer: Self-pay | Admitting: Hematology

## 2017-02-02 ENCOUNTER — Telehealth: Payer: Self-pay | Admitting: *Deleted

## 2017-02-02 NOTE — Telephone Encounter (Signed)
  Oncology Nurse Navigator Documentation  Navigator Location: [facility] (02/02/17 1400)   )Navigator Encounter Type: Telephone (02/02/17 1400) Telephone: Outgoing Call;Clinic/[facility]C Follow-up (02/02/17 1400)                                                  Time Spent with Patient: 15 (02/02/17 1400)

## 2017-02-03 ENCOUNTER — Other Ambulatory Visit: Payer: Self-pay | Admitting: General Surgery

## 2017-02-03 DIAGNOSIS — Z170 Estrogen receptor positive status [ER+]: Principal | ICD-10-CM

## 2017-02-03 DIAGNOSIS — C50412 Malignant neoplasm of upper-outer quadrant of left female breast: Secondary | ICD-10-CM

## 2017-02-10 ENCOUNTER — Encounter (HOSPITAL_BASED_OUTPATIENT_CLINIC_OR_DEPARTMENT_OTHER): Payer: Self-pay | Admitting: *Deleted

## 2017-02-11 ENCOUNTER — Other Ambulatory Visit: Payer: Self-pay

## 2017-02-11 ENCOUNTER — Encounter (HOSPITAL_BASED_OUTPATIENT_CLINIC_OR_DEPARTMENT_OTHER)
Admission: RE | Admit: 2017-02-11 | Discharge: 2017-02-11 | Disposition: A | Discharge status: Home / Self Care | Payer: Commercial Managed Care - PPO | Source: Ambulatory Visit | Attending: General Surgery | Admitting: General Surgery

## 2017-02-11 DIAGNOSIS — I10 Essential (primary) hypertension: Secondary | ICD-10-CM | POA: Diagnosis present

## 2017-02-11 LAB — BASIC METABOLIC PANEL
Anion gap: 13 (ref 5–15)
BUN: 10 mg/dL (ref 6–20)
CHLORIDE: 97 mmol/L — AB (ref 101–111)
CO2: 26 mmol/L (ref 22–32)
CREATININE: 0.89 mg/dL (ref 0.44–1.00)
Calcium: 9.5 mg/dL (ref 8.9–10.3)
GFR calc Af Amer: >60 mL/min (ref >60)
GFR calc non Af Amer: >60 mL/min (ref >60)
GLUCOSE: 97 mg/dL (ref 65–99)
Potassium: 3.4 mmol/L — ABNORMAL LOW (ref 3.5–5.1)
SODIUM: 136 mmol/L (ref 135–145)

## 2017-02-11 NOTE — Progress Notes (Signed)
Pt arrived for blood work and EKG. Gave pt Boost per ERAS protocol with instruction to finish drink DOS by 0430. Pt verbalized understanding.

## 2017-02-13 ENCOUNTER — Ambulatory Visit
Admission: RE | Admit: 2017-02-13 | Discharge: 2017-02-13 | Disposition: A | Discharge status: Home / Self Care | Payer: Commercial Managed Care - PPO | Source: Ambulatory Visit | Attending: General Surgery | Admitting: General Surgery

## 2017-02-13 ENCOUNTER — Telehealth: Payer: Self-pay | Admitting: Hematology

## 2017-02-13 DIAGNOSIS — C50912 Malignant neoplasm of unspecified site of left female breast: Secondary | ICD-10-CM | POA: Diagnosis not present

## 2017-02-13 DIAGNOSIS — C50412 Malignant neoplasm of upper-outer quadrant of left female breast: Secondary | ICD-10-CM

## 2017-02-13 DIAGNOSIS — Z170 Estrogen receptor positive status [ER+]: Principal | ICD-10-CM

## 2017-02-13 NOTE — Telephone Encounter (Signed)
No los per 01/28/17 visit.

## 2017-02-16 ENCOUNTER — Encounter (HOSPITAL_BASED_OUTPATIENT_CLINIC_OR_DEPARTMENT_OTHER): Payer: Self-pay | Admitting: *Deleted

## 2017-02-16 ENCOUNTER — Encounter (HOSPITAL_COMMUNITY)
Admission: RE | Admit: 2017-02-16 | Discharge: 2017-02-16 | Disposition: A | Discharge status: Home / Self Care | Payer: Commercial Managed Care - PPO | Source: Ambulatory Visit | Attending: General Surgery | Admitting: General Surgery

## 2017-02-16 ENCOUNTER — Encounter (HOSPITAL_BASED_OUTPATIENT_CLINIC_OR_DEPARTMENT_OTHER)
Admission: RE | Disposition: A | Discharge status: Home / Self Care | Payer: Self-pay | Source: Ambulatory Visit | Attending: General Surgery

## 2017-02-16 ENCOUNTER — Ambulatory Visit (HOSPITAL_BASED_OUTPATIENT_CLINIC_OR_DEPARTMENT_OTHER)
Admission: RE | Admit: 2017-02-16 | Discharge: 2017-02-16 | Disposition: A | Discharge status: Home / Self Care | Payer: Commercial Managed Care - PPO | Source: Ambulatory Visit | Attending: General Surgery | Admitting: General Surgery

## 2017-02-16 ENCOUNTER — Ambulatory Visit (HOSPITAL_BASED_OUTPATIENT_CLINIC_OR_DEPARTMENT_OTHER): Payer: Commercial Managed Care - PPO | Admitting: Anesthesiology

## 2017-02-16 ENCOUNTER — Ambulatory Visit
Admission: RE | Admit: 2017-02-16 | Discharge: 2017-02-16 | Disposition: A | Discharge status: Home / Self Care | Payer: Commercial Managed Care - PPO | Source: Ambulatory Visit | Attending: General Surgery | Admitting: General Surgery

## 2017-02-16 DIAGNOSIS — E039 Hypothyroidism, unspecified: Secondary | ICD-10-CM | POA: Diagnosis not present

## 2017-02-16 DIAGNOSIS — N62 Hypertrophy of breast: Secondary | ICD-10-CM | POA: Diagnosis not present

## 2017-02-16 DIAGNOSIS — Z170 Estrogen receptor positive status [ER+]: Principal | ICD-10-CM

## 2017-02-16 DIAGNOSIS — C50412 Malignant neoplasm of upper-outer quadrant of left female breast: Secondary | ICD-10-CM | POA: Insufficient documentation

## 2017-02-16 DIAGNOSIS — C50912 Malignant neoplasm of unspecified site of left female breast: Secondary | ICD-10-CM | POA: Diagnosis not present

## 2017-02-16 DIAGNOSIS — D242 Benign neoplasm of left breast: Secondary | ICD-10-CM | POA: Diagnosis not present

## 2017-02-16 DIAGNOSIS — Z79899 Other long term (current) drug therapy: Secondary | ICD-10-CM | POA: Insufficient documentation

## 2017-02-16 DIAGNOSIS — E7800 Pure hypercholesterolemia, unspecified: Secondary | ICD-10-CM | POA: Insufficient documentation

## 2017-02-16 DIAGNOSIS — G8918 Other acute postprocedural pain: Secondary | ICD-10-CM | POA: Diagnosis not present

## 2017-02-16 DIAGNOSIS — I10 Essential (primary) hypertension: Secondary | ICD-10-CM | POA: Insufficient documentation

## 2017-02-16 DIAGNOSIS — K219 Gastro-esophageal reflux disease without esophagitis: Secondary | ICD-10-CM | POA: Diagnosis not present

## 2017-02-16 HISTORY — PX: BREAST LUMPECTOMY WITH RADIOACTIVE SEED AND SENTINEL LYMPH NODE BIOPSY: SHX6550

## 2017-02-16 HISTORY — DX: Gastro-esophageal reflux disease without esophagitis: K21.9

## 2017-02-16 HISTORY — DX: Malignant (primary) neoplasm, unspecified: C80.1

## 2017-02-16 HISTORY — PX: BREAST LUMPECTOMY: SHX2

## 2017-02-16 SURGERY — BREAST LUMPECTOMY WITH RADIOACTIVE SEED AND SENTINEL LYMPH NODE BIOPSY
Anesthesia: General | Site: Breast | Laterality: Left

## 2017-02-16 MED ORDER — MIDAZOLAM HCL 2 MG/2ML IJ SOLN
INTRAMUSCULAR | Status: AC
  Filled 2017-02-16: qty 2

## 2017-02-16 MED ORDER — LIDOCAINE 2% (20 MG/ML) 5 ML SYRINGE
INTRAMUSCULAR | Status: DC | PRN
  Administered 2017-02-16: 100 mg via INTRAVENOUS

## 2017-02-16 MED ORDER — CEFAZOLIN SODIUM-DEXTROSE 2-4 GM/100ML-% IV SOLN
INTRAVENOUS | Status: AC
  Filled 2017-02-16: qty 100

## 2017-02-16 MED ORDER — ACETAMINOPHEN 500 MG PO TABS
ORAL_TABLET | ORAL | Status: AC
  Filled 2017-02-16: qty 2

## 2017-02-16 MED ORDER — MIDAZOLAM HCL 5 MG/5ML IJ SOLN
INTRAMUSCULAR | Status: DC | PRN
  Administered 2017-02-16: 2 mg via INTRAVENOUS

## 2017-02-16 MED ORDER — MIDAZOLAM HCL 2 MG/2ML IJ SOLN
1.0000 mg | INTRAMUSCULAR | Status: DC | PRN
  Administered 2017-02-16: 2 mg via INTRAVENOUS

## 2017-02-16 MED ORDER — ACETAMINOPHEN 500 MG PO TABS
1000.0000 mg | ORAL_TABLET | ORAL | Status: AC
  Administered 2017-02-16: 1000 mg via ORAL

## 2017-02-16 MED ORDER — OXYCODONE HCL 5 MG PO TABS
ORAL_TABLET | ORAL | Status: AC
  Filled 2017-02-16: qty 1

## 2017-02-16 MED ORDER — FENTANYL CITRATE (PF) 100 MCG/2ML IJ SOLN
INTRAMUSCULAR | Status: DC | PRN
  Administered 2017-02-16: 50 ug via INTRAVENOUS
  Administered 2017-02-16 (×2): 25 ug via INTRAVENOUS

## 2017-02-16 MED ORDER — GABAPENTIN 300 MG PO CAPS
ORAL_CAPSULE | ORAL | Status: AC
  Filled 2017-02-16: qty 1

## 2017-02-16 MED ORDER — BUPIVACAINE-EPINEPHRINE (PF) 0.5% -1:200000 IJ SOLN
INTRAMUSCULAR | Status: DC | PRN
  Administered 2017-02-16: 25 mL via PERINEURAL

## 2017-02-16 MED ORDER — OXYCODONE HCL 5 MG PO TABS
5.0000 mg | ORAL_TABLET | Freq: Once | ORAL | Status: AC | PRN
  Administered 2017-02-16: 5 mg via ORAL

## 2017-02-16 MED ORDER — HYDROMORPHONE HCL 1 MG/ML IJ SOLN
0.2500 mg | INTRAMUSCULAR | Status: DC | PRN
  Administered 2017-02-16: 0.5 mg via INTRAVENOUS

## 2017-02-16 MED ORDER — DEXAMETHASONE SODIUM PHOSPHATE 10 MG/ML IJ SOLN
INTRAMUSCULAR | Status: AC
  Filled 2017-02-16: qty 1

## 2017-02-16 MED ORDER — CEFAZOLIN SODIUM-DEXTROSE 2-4 GM/100ML-% IV SOLN
2.0000 g | INTRAVENOUS | Status: AC
  Administered 2017-02-16: 2 g via INTRAVENOUS

## 2017-02-16 MED ORDER — ONDANSETRON HCL 4 MG/2ML IJ SOLN: 4.0000 mg | Freq: Four times a day (QID) | INTRAMUSCULAR | Status: DC | PRN

## 2017-02-16 MED ORDER — ONDANSETRON HCL 4 MG/2ML IJ SOLN
INTRAMUSCULAR | Status: AC
  Filled 2017-02-16: qty 2

## 2017-02-16 MED ORDER — OXYCODONE HCL 5 MG/5ML PO SOLN: 5.0000 mg | Freq: Once | ORAL | Status: AC | PRN

## 2017-02-16 MED ORDER — LACTATED RINGERS IV SOLN
INTRAVENOUS | Status: DC
Start: 2017-02-16 — End: 2017-02-16
  Administered 2017-02-16: 07:00:00 via INTRAVENOUS

## 2017-02-16 MED ORDER — FENTANYL CITRATE (PF) 100 MCG/2ML IJ SOLN
INTRAMUSCULAR | Status: AC
  Filled 2017-02-16: qty 2

## 2017-02-16 MED ORDER — ONDANSETRON HCL 4 MG/2ML IJ SOLN
INTRAMUSCULAR | Status: DC | PRN
  Administered 2017-02-16: 4 mg via INTRAVENOUS

## 2017-02-16 MED ORDER — GABAPENTIN 300 MG PO CAPS
300.0000 mg | ORAL_CAPSULE | ORAL | Status: AC
  Administered 2017-02-16: 300 mg via ORAL

## 2017-02-16 MED ORDER — DEXAMETHASONE SODIUM PHOSPHATE 4 MG/ML IJ SOLN
INTRAMUSCULAR | Status: DC | PRN
  Administered 2017-02-16: 10 mg via INTRAVENOUS

## 2017-02-16 MED ORDER — LACTATED RINGERS IV SOLN
INTRAVENOUS | Status: DC | PRN
  Administered 2017-02-16 (×2): via INTRAVENOUS

## 2017-02-16 MED ORDER — PROPOFOL 10 MG/ML IV BOLUS
INTRAVENOUS | Status: AC
  Filled 2017-02-16: qty 40

## 2017-02-16 MED ORDER — PROPOFOL 10 MG/ML IV BOLUS
INTRAVENOUS | Status: DC | PRN
  Administered 2017-02-16: 200 mg via INTRAVENOUS

## 2017-02-16 MED ORDER — OXYCODONE-ACETAMINOPHEN 10-325 MG PO TABS: 1.0000 | ORAL_TABLET | Freq: Four times a day (QID) | ORAL | 0 refills | Status: DC | PRN

## 2017-02-16 MED ORDER — TECHNETIUM TC 99M SULFUR COLLOID FILTERED
1.0000 | Freq: Once | INTRAVENOUS | Status: AC | PRN
  Administered 2017-02-16: 1 via INTRADERMAL

## 2017-02-16 MED ORDER — HYDROMORPHONE HCL 1 MG/ML IJ SOLN
INTRAMUSCULAR | Status: AC
  Filled 2017-02-16: qty 1

## 2017-02-16 MED ORDER — LIDOCAINE 2% (20 MG/ML) 5 ML SYRINGE
INTRAMUSCULAR | Status: AC
  Filled 2017-02-16: qty 5

## 2017-02-16 MED ORDER — SCOPOLAMINE 1 MG/3DAYS TD PT72: 1.0000 | MEDICATED_PATCH | Freq: Once | TRANSDERMAL | Status: DC | PRN

## 2017-02-16 MED ORDER — BUPIVACAINE HCL (PF) 0.25 % IJ SOLN
INTRAMUSCULAR | Status: AC
  Filled 2017-02-16: qty 60

## 2017-02-16 MED ORDER — BUPIVACAINE HCL (PF) 0.25 % IJ SOLN
INTRAMUSCULAR | Status: DC | PRN
  Administered 2017-02-16: 6 mL

## 2017-02-16 MED ORDER — FENTANYL CITRATE (PF) 100 MCG/2ML IJ SOLN
50.0000 ug | INTRAMUSCULAR | Status: DC | PRN
  Administered 2017-02-16: 100 ug via INTRAVENOUS
  Administered 2017-02-16: 50 ug via INTRAVENOUS

## 2017-02-16 SURGICAL SUPPLY — 54 items
ADH SKN CLS APL DERMABOND .7 (GAUZE/BANDAGES/DRESSINGS) ×1
APPLIER CLIP 9.375 MED OPEN (MISCELLANEOUS) ×2
APR CLP MED 9.3 20 MLT OPN (MISCELLANEOUS) ×1
BINDER BREAST XLRG (GAUZE/BANDAGES/DRESSINGS) IMPLANT
BINDER BREAST XXLRG (GAUZE/BANDAGES/DRESSINGS) ×1 IMPLANT
BLADE SURG 15 STRL LF DISP TIS (BLADE) ×1 IMPLANT
BLADE SURG 15 STRL SS (BLADE) ×2
CANISTER SUCT 1200ML W/VALVE (MISCELLANEOUS) ×1 IMPLANT
CHLORAPREP W/TINT 26ML (MISCELLANEOUS) ×2 IMPLANT
CLIP APPLIE 9.375 MED OPEN (MISCELLANEOUS) IMPLANT
CLIP TI WIDE RED SMALL 6 (CLIP) ×2 IMPLANT
COVER BACK TABLE 60X90IN (DRAPES) ×2 IMPLANT
COVER MAYO STAND STRL (DRAPES) ×2 IMPLANT
COVER PROBE W GEL 5X96 (DRAPES) ×2 IMPLANT
DERMABOND ADVANCED (GAUZE/BANDAGES/DRESSINGS) ×1
DERMABOND ADVANCED .7 DNX12 (GAUZE/BANDAGES/DRESSINGS) ×1 IMPLANT
DEVICE DUBIN W/COMP PLATE 8390 (MISCELLANEOUS) ×2 IMPLANT
DRAPE LAPAROSCOPIC ABDOMINAL (DRAPES) ×2 IMPLANT
DRAPE UTILITY XL STRL (DRAPES) ×2 IMPLANT
ELECT BLADE 4.0 EZ CLEAN MEGAD (MISCELLANEOUS) ×2
ELECT COATED BLADE 2.86 ST (ELECTRODE) ×2 IMPLANT
ELECT REM PT RETURN 9FT ADLT (ELECTROSURGICAL) ×2
ELECTRODE BLDE 4.0 EZ CLN MEGD (MISCELLANEOUS) IMPLANT
ELECTRODE REM PT RTRN 9FT ADLT (ELECTROSURGICAL) ×1 IMPLANT
GLOVE BIO SURGEON STRL SZ 6.5 (GLOVE) ×1 IMPLANT
GLOVE BIO SURGEON STRL SZ7 (GLOVE) ×4 IMPLANT
GLOVE BIOGEL PI IND STRL 7.0 (GLOVE) IMPLANT
GLOVE BIOGEL PI IND STRL 7.5 (GLOVE) ×1 IMPLANT
GLOVE BIOGEL PI INDICATOR 7.0 (GLOVE) ×1
GLOVE BIOGEL PI INDICATOR 7.5 (GLOVE) ×1
GOWN STRL REUS W/ TWL LRG LVL3 (GOWN DISPOSABLE) ×2 IMPLANT
GOWN STRL REUS W/TWL LRG LVL3 (GOWN DISPOSABLE) ×4
HEMOSTAT ARISTA ABSORB 3G PWDR (MISCELLANEOUS) ×1 IMPLANT
ILLUMINATOR WAVEGUIDE N/F (MISCELLANEOUS) ×1 IMPLANT
KIT MARKER MARGIN INK (KITS) ×2 IMPLANT
NDL HYPO 25X1 1.5 SAFETY (NEEDLE) ×1 IMPLANT
NEEDLE HYPO 25X1 1.5 SAFETY (NEEDLE) ×2 IMPLANT
NS IRRIG 1000ML POUR BTL (IV SOLUTION) ×1 IMPLANT
PACK BASIN DAY SURGERY FS (CUSTOM PROCEDURE TRAY) ×2 IMPLANT
PENCIL BUTTON HOLSTER BLD 10FT (ELECTRODE) ×2 IMPLANT
SLEEVE SCD COMPRESS KNEE MED (MISCELLANEOUS) ×2 IMPLANT
SPONGE LAP 4X18 X RAY DECT (DISPOSABLE) ×2 IMPLANT
STRIP CLOSURE SKIN 1/2X4 (GAUZE/BANDAGES/DRESSINGS) ×2 IMPLANT
SUT MNCRL AB 4-0 PS2 18 (SUTURE) ×2 IMPLANT
SUT SILK 2 0 SH (SUTURE) ×2 IMPLANT
SUT VIC AB 2-0 SH 27 (SUTURE) ×6
SUT VIC AB 2-0 SH 27XBRD (SUTURE) ×1 IMPLANT
SUT VIC AB 3-0 SH 27 (SUTURE) ×2
SUT VIC AB 3-0 SH 27X BRD (SUTURE) ×1 IMPLANT
SYR CONTROL 10ML LL (SYRINGE) ×2 IMPLANT
TOWEL OR 17X24 6PK STRL BLUE (TOWEL DISPOSABLE) ×2 IMPLANT
TOWEL OR NON WOVEN STRL DISP B (DISPOSABLE) ×2 IMPLANT
TUBE CONNECTING 20X1/4 (TUBING) ×1 IMPLANT
YANKAUER SUCT BULB TIP NO VENT (SUCTIONS) ×1 IMPLANT

## 2017-02-16 NOTE — Progress Notes (Signed)
Assisted Dr. Marcie Bal with left, ultrasound guided, pectoralis block. Side rails up, monitors on throughout procedure. See vital signs in flow sheet. Tolerated Procedure well.

## 2017-02-16 NOTE — Transfer of Care (Signed)
Immediate Anesthesia Transfer of Care Note  Patient: [name]  Procedure(s) Performed: Procedure(s) (LRB): BREAST LUMPECTOMY WITH RADIOACTIVE SEED AND AXILLARY SENTINEL LYMPH NODE BIOPSY (Left)  Patient Location: PACU  Anesthesia Type: General  Level of Consciousness: awake, sedated, patient cooperative and responds to stimulation  Airway & Oxygen Therapy: Patient Spontanous Breathing and Patient connected to face mask oxygen  Post-op Assessment: Report given to PACU RN, Post -op Vital signs reviewed and stable and Patient moving all extremities  Post vital signs: Reviewed and stable  Complications: No apparent anesthesia complications

## 2017-02-16 NOTE — Progress Notes (Signed)
Nuc med staff performed nuc med inj. Additional sedation (fentanyl) given for discomfort. Pt tol well. See VS. Will bring pt's son to bedside and updated/provided emotional support

## 2017-02-16 NOTE — H&P (Signed)
61 yof referred by Dr Kyung Rudd for newly diagnosed left breast cancer. she has no personal of breast disease. has fh in mom at age 61. she had no mass or dc. she underwent screening mm that shows b density breasts. she has distortion in left upper outer quadrant. on [place] there is a 1.2x0.8x1 cm mass. axilla is negative. on biopsy this is a grade I ILC with ALH that is er/pr pos, her 2 negative and Ki is less than 5%. she is here to discuss options  Past Surgical History Tawni Pummel, RN; 01/28/2017 7:44 AM) Breast Biopsy  Left. Hip Surgery  Left. Knee Surgery  Left. Shoulder Surgery  Left. Tonsillectomy   Diagnostic Studies History Tawni Pummel, RN; 01/28/2017 7:44 AM) Colonoscopy  5-10 years ago Mammogram  within last year Pap Smear  1-5 years ago  Medication History Tawni Pummel, RN; 01/28/2017 7:44 AM) Medications Reconciled  Social History Tawni Pummel, RN; 01/28/2017 7:44 AM) Caffeine use  Coffee. No alcohol use  No drug use  Tobacco use  Never smoker.  Family History Tawni Pummel, RN; 01/28/2017 7:44 AM) Alcohol Abuse  Family Members In General. Arthritis  Family Members In General, Mother. Breast Cancer  Mother. Cancer  Family Members In General. Cerebrovascular Accident  Family Members In General, Father. Colon Cancer  Family Members In General. Colon Polyps  Family Members In General. Depression  Mother. Diabetes Mellitus  Family Members In General, Mother. Hypertension  Family Members In General, Mother. Respiratory Condition  Family Members In General. Thyroid problems  Mother.  Pregnancy / Birth History Tawni Pummel, RN; 01/28/2017 7:44 AM) Age at menarche  89 years. Age of menopause  51-55 Contraceptive History  Oral contraceptives. Gravida  1 Maternal age  65-20 Para  1  Other Problems Tawni Pummel, RN; 01/28/2017 7:44 AM) Bladder Problems  Gastroesophageal Reflux Disease  High blood pressure   Hypercholesterolemia  Lump In Breast  Thyroid Disease   Review of Systems Sunday Spillers Ledford RN; 01/28/2017 7:44 AM) General Not Present- Appetite Loss, Chills, Fatigue, Fever, Night Sweats, Weight Gain and Weight Loss. Skin Not Present- Change in Wart/Mole, Dryness, Hives, Jaundice, New Lesions, Non-Healing Wounds, Rash and Ulcer. HEENT Present- Hearing Loss and Wears glasses/contact lenses. Not Present- Earache, Hoarseness, Nose Bleed, Oral Ulcers, Ringing in the Ears, Seasonal Allergies, Sinus Pain, Sore Throat, Visual Disturbances and Yellow Eyes. Respiratory Present- Snoring. Not Present- Bloody sputum, Chronic Cough, Difficulty Breathing and Wheezing. Breast Present- Breast Mass. Not Present- Breast Pain, Nipple Discharge and Skin Changes. Cardiovascular Present- Rapid Heart Rate and Swelling of Extremities. Not Present- Chest Pain, Difficulty Breathing Lying Down, Leg Cramps, Palpitations and Shortness of Breath. Gastrointestinal Not Present- Abdominal Pain, Bloating, Bloody Stool, Change in Bowel Habits, Chronic diarrhea, Constipation, Difficulty Swallowing, Excessive gas, Gets full quickly at meals, Hemorrhoids, Indigestion, Nausea, Rectal Pain and Vomiting. Female Genitourinary Present- Urgency. Not Present- Frequency, Nocturia, Painful Urination and Pelvic Pain. Musculoskeletal Not Present- Back Pain, Joint Pain, Joint Stiffness, Muscle Pain, Muscle Weakness and Swelling of Extremities. Neurological Not Present- Decreased Memory, Fainting, Headaches, Numbness, Seizures, Tingling, Tremor, Trouble walking and Weakness. Psychiatric Not Present- Anxiety, Bipolar, Change in Sleep Pattern, Depression, Fearful and Frequent crying. Endocrine Not Present- Cold Intolerance, Excessive Hunger, Hair Changes, Heat Intolerance, Hot flashes and New Diabetes. Hematology Not Present- Blood Thinners, Easy Bruising, Excessive bleeding, Gland problems, HIV and Persistent Infections.   Physical Exam  Rolm Bookbinder MD; 01/28/2017 1:05 PM) General Mental Status-Alert. Orientation-Oriented X3. Eye Sclera/Conjunctiva - Bilateral-No scleral icterus. Chest and Lung  Exam Chest and lung exam reveals -on auscultation, normal breath sounds, no adventitious sounds and normal vocal resonance. Breast Nipples-No Discharge. Breast Lump-No Palpable Breast Mass. Cardiovascular Cardiovascular examination reveals -normal heart sounds, regular rate and rhythm with no murmurs. Abdomen Note: soft nt Lymphatic Head & Neck General Head & Neck Lymphatics: Bilateral - Description - Normal. Axillary General Axillary Region: Bilateral - Description - Normal. Note: no Craig adenopathy   Assessment & Plan Rolm Bookbinder MD; 01/28/2017 1:04 PM) BREAST CANCER OF UPPER-OUTER QUADRANT OF LEFT FEMALE BREAST (C50.412) Story: Left breast seed guided lumpectomy, left axillary sentinel node biopsy We discussed the staging and pathophysiology of breast cancer. We discussed all of the different options for treatment for breast cancer including surgery, chemotherapy, radiation therapy, Herceptin, and antiestrogen therapy. We discussed a sentinel lymph node biopsy as she does not appear to having lymph node involvement right now. We discussed the performance of that with injection of radioactive tracer and possibly blue dye. We discussed that she would have an incision underneath her axillary hairline. We discussed that there is a chance of having a positive node with a sentinel lymph node biopsy and we will await the permanent pathology to make any other first further decisions in terms of her treatment. We discussed up to a 5% risk lifetime of chronic shoulder pain as well as lymphedema associated with a sentinel lymph node biopsy. We discussed the options for treatment of the breast cancer which included lumpectomy versus a mastectomy. We discussed the performance of the lumpectomy with radioactive seed  placement. We discussed a 5-10% chance of a positive margin requiring reexcision in the operating room. We also discussed that she will likely need radiation therapy if she undergoes lumpectomy. . We discussed the mastectomy (removal of whole breast) and the postoperative care for that as well. Mastectomy can be followed by reconstruction. This is a more extensive surgery and requires more recovery. The decision for lumpectomy vs mastectomy has no impact on decision for chemotherapy. Most mastectomy patients will not need radiation therapy. We discussed that there is no difference in her survival whether she undergoes lumpectomy with radiation therapy or antiestrogen therapy versus a mastectomy. There is also no real difference between her recurrence in the breast We discussed the risks of operation including bleeding, infection, possible reoperation.

## 2017-02-16 NOTE — Anesthesia Procedure Notes (Signed)
Anesthesia Regional Block: Pectoralis block   Pre-Anesthetic Checklist: ,, timeout performed, Correct Patient, Correct Site, Correct Laterality, Correct Procedure, Correct Position, site marked, Risks and benefits discussed,  Surgical consent,  Pre-op evaluation,  At surgeon's request and post-op pain management  Laterality: Left  Prep: chloraprep       Needles:  Injection technique: Single-shot  Needle Type: Echogenic Needle     Needle Length: 9cm  Needle Gauge: 21     Additional Needles:   Procedures: ultrasound guided,,,,,,,,  Narrative:  Start time: 02/16/2017 7:49 AM End time: 02/16/2017 7:55 AM Injection made incrementally with aspirations every 5 mL.  Performed by: Personally  Anesthesiologist: Kalel Harty  Additional Notes: Pt tolerated the procedure well.

## 2017-02-16 NOTE — Anesthesia Procedure Notes (Signed)
Performed by: Rubena Roseman C       

## 2017-02-16 NOTE — Anesthesia Procedure Notes (Signed)
Procedure Name: LMA Insertion Date/Time: 02/16/2017 8:40 AM Performed by: Justice Rocher Pre-anesthesia Checklist: Patient identified, Emergency Drugs available, Suction available and Patient being monitored Patient Re-evaluated:Patient Re-evaluated prior to inductionOxygen Delivery Method: Circle system utilized Preoxygenation: Pre-oxygenation with 100% oxygen Intubation Type: IV induction Ventilation: Mask ventilation without difficulty LMA: LMA inserted LMA Size: 4.0 Number of attempts: 1 Airway Equipment and Method: Bite block Placement Confirmation: positive ETCO2 and breath sounds checked- equal and bilateral Tube secured with: Tape Dental Injury: Teeth and Oropharynx as per pre-operative assessment

## 2017-02-16 NOTE — Anesthesia Postprocedure Evaluation (Signed)
Anesthesia Post Note  Patient: [name]  Procedure(s) Performed: Procedure(s) (LRB): BREAST LUMPECTOMY WITH RADIOACTIVE SEED AND AXILLARY SENTINEL LYMPH NODE BIOPSY (Left)  Patient location during evaluation: PACU Anesthesia Type: General and Regional Level of consciousness: awake and alert Pain management: pain level controlled Vital Signs Assessment: post-procedure vital signs reviewed and stable Respiratory status: spontaneous breathing, nonlabored ventilation, respiratory function stable and patient connected to nasal cannula oxygen Cardiovascular status: blood pressure returned to baseline and stable Postop Assessment: no signs of nausea or vomiting Anesthetic complications: no       Last Vitals:  Vitals:   02/16/17 1048 02/16/17 1108  BP:  122/76  Pulse: 64 63  Resp: 15 16  Temp:  36.9 C    Last Pain:  Vitals:   02/16/17 1108  TempSrc: Oral  PainSc: 4                  Montez Hageman

## 2017-02-16 NOTE — Anesthesia Preprocedure Evaluation (Signed)
Anesthesia Evaluation  Patient identified by MRN, date of birth, ID band Patient awake    Reviewed: Allergy & Precautions, H&P , NPO status , Patient's Chart, lab work & pertinent test results  Airway Mallampati: II   Neck ROM: full    Dental   Pulmonary    breath sounds clear to auscultation       Cardiovascular hypertension,  Rhythm:regular Rate:Normal     Neuro/Psych  Neuromuscular disease    GI/Hepatic GERD  ,  Endo/Other  Hypothyroidism   Renal/GU      Musculoskeletal  (+) Arthritis ,   Abdominal   Peds  Hematology   Anesthesia Other Findings   Reproductive/Obstetrics                             Anesthesia Physical Anesthesia Plan  ASA: II  Anesthesia Plan: General   Post-op Pain Management:  Regional for Post-op pain   Induction: Intravenous  Airway Management Planned: LMA  Additional Equipment:   Intra-op Plan:   Post-operative Plan:   Informed Consent: I have reviewed the patients History and Physical, chart, labs and discussed the procedure including the risks, benefits and alternatives for the proposed anesthesia with the patient or authorized representative who has indicated his/her understanding and acceptance.     Plan Discussed with: CRNA, Anesthesiologist and Surgeon  Anesthesia Plan Comments:         Anesthesia Quick Evaluation

## 2017-02-16 NOTE — Op Note (Addendum)
Preoperative diagnosis: Left breast cancer, clinical stage I Postoperative diagnosis: same as above Procedure: 1. Left breast seed guided lumpectomy 2. Left deep axillary sentinel node biopsy Surgeon Dr Serita Grammes Anes general  EBL: minimal Comps none Specimen:  1. Left breast marked with paint containing seed and clip 2. Leftaxillary sentinel nodes with highest count 691 3. Additional superior, lateral, inferior, medial and anterior margins marked short superior, long lateral, double deep Sponge count correct at completion Dispoto recovery stable  Indications: This is a 50 yof with stage I ILC in luoq. She was seen by oncology and myself. We have decided to proceed with lumpectomy/sn biopsy.She had seed placed prior to beginning. I had these mm in the OR.   Procedure: After informed consent was obtained the patient was taken to the OR. She was injected with technetium in the standard periareolar fashion.She was given anitibiotics. SCDs were in place. She was prepped and draped in the standard sterile surgical fashion. A timeout was performed. She had undergone a pectoral block.  I then located the seed in the left lateralbreast.I made an incision in the axilla in attempt to hide the scar and did both the lumpectomy and the sn via this incision. I then removed the seedwith an attempt to get a clear margin. This waspassed off the table after marking with paint.I did confirm removal of theclipand seed with mammography.the clip and seed weredirectly in center of specimen.due to lobular nature I did remove additional margins as above. I placed arista in the cavity.  I obtained hemostasis.I then opened the axillary fascia.  I identified the sentinel nodes with the highest count as above. There was no gross nodal disease.There was no background radioactivity. I obtained hemostasis. I closed the axillary fascia and breast tissue with 2-0 vicryl. I then closed the dermis with  3-0 vicryl and the skin with 4-0 monocryl.  Glue and steristrips were placed A breast binder was placed. She was extubated and transferred to recovery stable

## 2017-02-16 NOTE — Interval H&P Note (Signed)
History and Physical Interval Note:  02/16/2017 8:13 AM  [name]  has presented today for surgery, with the diagnosis of LEFT BREAST CANCER  The various methods of treatment have been discussed with the patient and family. After consideration of risks, benefits and other options for treatment, the patient has consented to  Procedure(s): BREAST LUMPECTOMY WITH RADIOACTIVE SEED AND AXILLARY SENTINEL LYMPH NODE BIOPSY (Left) as a surgical intervention .  The patient's history has been reviewed, patient examined, no change in status, stable for surgery.  I have reviewed the patient's chart and labs.  Questions were answered to the patient's satisfaction.     Finnian Husted

## 2017-02-16 NOTE — Discharge Instructions (Signed)
[facility] °Office Phone Number [phone] ° °POST OP INSTRUCTIONS ° °Always review your discharge instruction sheet given to you by the facility where your surgery was performed. ° °IF YOU HAVE DISABILITY OR FAMILY LEAVE FORMS, YOU MUST BRING THEM TO THE OFFICE FOR PROCESSING.  DO NOT GIVE THEM TO YOUR DOCTOR. ° °1. A prescription for pain medication may be given to you upon discharge.  Take your pain medication as prescribed, if needed.  If narcotic pain medicine is not needed, then you may take acetaminophen (Tylenol), naprosyn (Alleve) or ibuprofen (Advil) as needed. °2. Take your usually prescribed medications unless otherwise directed °3. If you need a refill on your pain medication, please contact your pharmacy.  They will contact our office to request authorization.  Prescriptions will not be filled after 5pm or on week-ends. °4. You should eat very light the first 24 hours after surgery, such as soup, crackers, pudding, etc.  Resume your normal diet the day after surgery. °5. Most patients will experience some swelling and bruising in the breast.  Ice packs and a good support bra will help.  Wear the breast binder provided or a sports bra for 72 hours day and night.  After that wear a sports bra during the day until you return to the office. Swelling and bruising can take several days to resolve.  °6. It is common to experience some constipation if taking pain medication after surgery.  Increasing fluid intake and taking a stool softener will usually help or prevent this problem from occurring.  A mild laxative (Milk of Magnesia or Miralax) should be taken according to package directions if there are no bowel movements after 48 hours. °7. Unless discharge instructions indicate otherwise, you may remove your bandages 48 hours after surgery and you may shower at that time.  You may have steri-strips (small skin tapes) in place directly over the incision.  These strips should be left on the  skin for 7-10 days and will come off on their own.  If your surgeon used skin glue on the incision, you may shower in 24 hours.  The glue will flake off over the next 2-3 weeks.  Any sutures or staples will be removed at the office during your follow-up visit. °8. ACTIVITIES:  You may resume regular daily activities (gradually increasing) beginning the next day.  Wearing a good support bra or sports bra minimizes pain and swelling.  You may have sexual intercourse when it is comfortable. °a. You may drive when you no longer are taking prescription pain medication, you can comfortably wear a seatbelt, and you can safely maneuver your car and apply brakes. °b. RETURN TO WORK:  ______________________________________________________________________________________ °9. You should see your doctor in the office for a follow-up appointment approximately two weeks after your surgery.  Your doctor’s nurse will typically make your follow-up appointment when she calls you with your pathology report.  Expect your pathology report 3-4 business days after your surgery.  You may call to check if you do not hear from us after three days. °10. OTHER INSTRUCTIONS: _______________________________________________________________________________________________ _____________________________________________________________________________________________________________________________________ °_____________________________________________________________________________________________________________________________________ °_____________________________________________________________________________________________________________________________________ ° °WHEN TO CALL DR WAKEFIELD: °1. Fever over 101.0 °2. Nausea and/or vomiting. °3. Extreme swelling or bruising. °4. Continued bleeding from incision. °5. Increased pain, redness, or drainage from the incision. ° °The clinic staff is available to answer your questions during regular  business hours.  Please don’t hesitate to call and ask to speak to one of the nurses for clinical concerns.  If   you have a medical emergency, go to the nearest emergency room or call 911.  A surgeon from [facility] is always on call at the hospital. ° °For further questions, please visit [email or web address] ° ° ° °Post Anesthesia Home Care Instructions ° °Activity: °Get plenty of rest for the remainder of the day. A responsible adult should stay with you for 24 hours following the procedure.  °For the next 24 hours, DO NOT: °-Drive a car °-Operate machinery °-Drink alcoholic beverages °-Take any medication unless instructed by your physician °-Make any legal decisions or sign important papers. ° °Meals: °Start with liquid foods such as gelatin or soup. Progress to regular foods as tolerated. Avoid greasy, spicy, heavy foods. If nausea and/or vomiting occur, drink only clear liquids until the nausea and/or vomiting subsides. Call your physician if vomiting continues. ° °Special Instructions/Symptoms: °Your throat may feel dry or sore from the anesthesia or the breathing tube placed in your throat during surgery. If this causes discomfort, gargle with warm salt water. The discomfort should disappear within 24 hours. ° °If you had a scopolamine patch placed behind your ear for the management of post- operative nausea and/or vomiting: ° °1. The medication in the patch is effective for 72 hours, after which it should be removed.  Wrap patch in a tissue and discard in the trash. Wash hands thoroughly with soap and water. °2. You may remove the patch earlier than 72 hours if you experience unpleasant side effects which may include dry mouth, dizziness or visual disturbances. °3. Avoid touching the patch. Wash your hands with soap and water after contact with the patch. °  ° °

## 2017-02-17 ENCOUNTER — Encounter (HOSPITAL_BASED_OUTPATIENT_CLINIC_OR_DEPARTMENT_OTHER): Payer: Self-pay | Admitting: General Surgery

## 2017-02-17 NOTE — Addendum Note (Signed)
Addendum  created 02/17/17 1055 by Tawni Millers, CRNA   Charge Capture section accepted

## 2017-02-26 DIAGNOSIS — Z170 Estrogen receptor positive status [ER+]: Secondary | ICD-10-CM | POA: Diagnosis not present

## 2017-02-26 DIAGNOSIS — C50412 Malignant neoplasm of upper-outer quadrant of left female breast: Secondary | ICD-10-CM | POA: Diagnosis not present

## 2017-03-02 ENCOUNTER — Telehealth: Payer: Self-pay | Admitting: *Deleted

## 2017-03-02 NOTE — Telephone Encounter (Signed)
Received oncotype score of 12/8%.  Called pt with results and informed she does not need chemotherapy. Referred pt to see Dr. Lisbeth Renshaw for radiation. Physician team notified.

## 2017-03-04 ENCOUNTER — Encounter: Payer: Self-pay | Admitting: Radiation Oncology

## 2017-03-04 NOTE — Progress Notes (Addendum)
Location of Breast Cancer:Left Breast Upper Outer Quadrant  Histology per Pathology Report:  Diagnosis 01/15/2017 Breast, left, needle core biopsy, 2:00 o'clock - INVASIVE LOBULAR CARCINOMA.- ATYPICAL LOBULAR HYPERPLASIA.- MICROCALCIFICATIONS PRESENT  Receptor Status: ER(90%+), PR (90%), Her2-neu (neg ratio=1.24), Ki-67(1%)  Did patient present with symptoms (if so, please note symptoms) or was this found on screening mammography?: routine screening mammogram  Surgeon: 2/26/218: DR> [name], MDREPORT OF SURGICAL PATHOLOGY SS: Surgeon:  Dr. Rolm Bookbinder, MD 02/16/2017: saw Dr. Winifred [name] 03/03/17 breast warm and reddened, follow up next Monday 03/09/17 1. Breast, lumpectomy, Left - INVASIVE GRADE I LOBULAR CARCINOMA. - TUMOR IS ESTIMATED TO SPAN AT LEAST 2.1 CM IN GREATEST DIMENSION. 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 - INVASIVE TUMOR IS FOCALLY LESS THAN 0.1 CM TO LATERAL MARGIN BUT INKED MARGINAL SURFACE IS NEGATIVE FOR TUMOR. - INITIAL ANTERIOR MARGIN CANNOT BE EFFECTIVELY CLEARED OF INVASIVE TUMOR, PLEASE SEE SPECIMEN #10 FOR FINAL MARGIN STATUS. - SEE ONCOLOGY TEMPLATE. 2. Lymph node, sentinel, biopsy, Left Axillary - ONE BENIGN LYMPH NODE NODE WITH NO TUMOR SEEN (0/1). - SEE COMMENT. 3. Lymph node, sentinel, biopsy, Left Axillary - ONE BENIGN LYMPH NODE NODE WITH NO TUMOR SEEN (0/1). - SEE COMMENT. 4. Lymph node, sentinel, biopsy, Left Axillary - ONE BENIGN LYMPH NODE NODE WITH NO TUMOR SEEN (0/1). - SEE COMMENT. 5. Lymph node, sentinel, biopsy, Left Axillary - ONE BENIGN LYMPH NODE NODE WITH NO TUMOR SEEN (0/1). - SEE COMMENT. 6. Breast, excision, Left additional Posterior Margin - BENIGN BREAST PARENCHYMA AND FIBROFATTY SOFT TISSUE. - NO ATYPIA OR TUMOR SEEN. 7. Breast, excision, Left additional Superior Margin - BENIGN BREAST PARENCHYMA AND FIBROFATTY SOFT TISSUE. - NO ATYPIA OR TUMOR SEEN. 8. Breast, excision, Left  additional Inferior Margin - BREAST PARENCHYMA SHOWING USUAL DUCTAL HYPERPLASIA. - INCIDENTAL HYALINIZED FIBROADENOMA WITH CALCIFICATIONS. - NO ATYPIA OR TUMOR SEEN. 1 of 5 FINAL for [name], [name] ([id number]) Diagnosis(continued) 9. Breast, excision, Left additional Medial Margin - BENIGN BREAST PARENCHYMA AND FIBROFATTY SOFT TISSUE. - NO ATYPIA OR TUMOR SEEN. 10. Breast, excision, Left additional Anterior Margin - BENIGN FIBROFATTY SOFT TISSUE. - NO ATYPIA OR TUMOR SEEN.  Past/Anticipated interventions by medical oncology, if any: , Oncotype results =12, no MD scheduled as yet  Lymphedema issues, if any:   Pain issues, if any:  Yes, takes tylenol prn left axilla , has hematoma lateral side of breast, bruising all over breast yellow/green colored , swelling on antibiotics started 03/03/17 amoxicillin -clav for 7 days,  SAFETY ISSUES:  Prior radiation? NO  Pacemaker/ICD? NO  Possible current pregnancy: NO  Current Complaints / other details: Single,   Menarche age 88, G15P1, birth age 10, non smoker, no tobacco use, no alcohol or drug use Father Cardiac arrest,Mother MI,,Breast cancer ,    BP 129/77 (BP Location: Right Arm, Patient Position: Sitting, Cuff Size: Large)   Pulse 80   Temp 98.2 F (36.8 C) (Oral)   Resp 20   Ht 5' 6"  (1.676 m)   Wt 200 lb 6.4 oz (90.9 kg)   BMI 32.35 kg/m   Wt Readings from Last 3 Encounters:  03/05/17 200 lb 6.4 oz (90.9 kg)  02/16/17 202 lb (91.6 kg)  01/28/17 206 lb 4.8 oz (93.6 kg)   Mallory Eaton, RN 03/04/2017,12:16 PM

## 2017-03-05 ENCOUNTER — Encounter (HOSPITAL_COMMUNITY): Payer: Self-pay

## 2017-03-05 ENCOUNTER — Ambulatory Visit
Admission: RE | Admit: 2017-03-05 | Discharge: 2017-03-05 | Disposition: A | Discharge status: Home / Self Care | Payer: Commercial Managed Care - PPO | Source: Ambulatory Visit | Attending: Radiation Oncology | Admitting: Radiation Oncology

## 2017-03-05 ENCOUNTER — Encounter: Payer: Self-pay | Admitting: Radiation Oncology

## 2017-03-05 DIAGNOSIS — I10 Essential (primary) hypertension: Secondary | ICD-10-CM | POA: Diagnosis not present

## 2017-03-05 DIAGNOSIS — Z79899 Other long term (current) drug therapy: Secondary | ICD-10-CM | POA: Insufficient documentation

## 2017-03-05 DIAGNOSIS — Z9889 Other specified postprocedural states: Secondary | ICD-10-CM | POA: Diagnosis not present

## 2017-03-05 DIAGNOSIS — E039 Hypothyroidism, unspecified: Secondary | ICD-10-CM | POA: Diagnosis not present

## 2017-03-05 DIAGNOSIS — Z510 Encounter for antineoplastic radiation therapy: Secondary | ICD-10-CM | POA: Insufficient documentation

## 2017-03-05 DIAGNOSIS — K219 Gastro-esophageal reflux disease without esophagitis: Secondary | ICD-10-CM | POA: Insufficient documentation

## 2017-03-05 DIAGNOSIS — Z8249 Family history of ischemic heart disease and other diseases of the circulatory system: Secondary | ICD-10-CM | POA: Insufficient documentation

## 2017-03-05 DIAGNOSIS — E785 Hyperlipidemia, unspecified: Secondary | ICD-10-CM | POA: Insufficient documentation

## 2017-03-05 DIAGNOSIS — N3281 Overactive bladder: Secondary | ICD-10-CM | POA: Diagnosis not present

## 2017-03-05 DIAGNOSIS — C50412 Malignant neoplasm of upper-outer quadrant of left female breast: Secondary | ICD-10-CM | POA: Diagnosis not present

## 2017-03-05 DIAGNOSIS — E669 Obesity, unspecified: Secondary | ICD-10-CM | POA: Insufficient documentation

## 2017-03-05 DIAGNOSIS — M1990 Unspecified osteoarthritis, unspecified site: Secondary | ICD-10-CM | POA: Diagnosis not present

## 2017-03-05 DIAGNOSIS — Z170 Estrogen receptor positive status [ER+]: Secondary | ICD-10-CM | POA: Diagnosis not present

## 2017-03-05 DIAGNOSIS — Z6832 Body mass index (BMI) 32.0-32.9, adult: Secondary | ICD-10-CM | POA: Diagnosis not present

## 2017-03-05 DIAGNOSIS — Z96642 Presence of left artificial hip joint: Secondary | ICD-10-CM | POA: Insufficient documentation

## 2017-03-05 HISTORY — DX: Malignant neoplasm of unspecified site of unspecified female breast: C50.919

## 2017-03-05 NOTE — Addendum Note (Signed)
Encounter addended by: Kyung Rudd, MD on: 03/05/2017 12:42 PM<BR>    Actions taken: Sign clinical note

## 2017-03-05 NOTE — Progress Notes (Signed)
Please see the Nurse Progress Note in the MD Initial Consult Encounter for this patient. 

## 2017-03-05 NOTE — Progress Notes (Addendum)
Radiation Oncology         [phone] ________________________________  Name: [name] MRN: [id number]  Date: 03/05/2017  DOB: 10-11-1956  WU:[id number] [name] Mallory Salina, MD  Truitt Merle, MD     REFERRING PHYSICIAN: Truitt Merle, MD   DIAGNOSIS: The encounter diagnosis was Malignant neoplasm of upper-outer quadrant of left breast in female, estrogen receptor positive ([name]).   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 female seen in the multidisciplinary breast clinic for a new diagnosis of left breast cancer. She was found to have a distortion on her screening mammography. She subsequently underwent diagnostic imaging and this revealed a 1.2 x .8 x 1 cm mass in the left breast. The axillary ultrasound was negative for cortical thickening. She underwent left breast biopsy on 01/15/17 which revealed focal grade 1 invasive lobular carcinoma and hyperplasia, ER/PR positive, HER2 not amplified, Ki-67% was <5%.  She presented to breast clinic on 01/28/17 to discuss options of treatment for her cancer. The patient underwent surgery on 02/16/17 by Dr. Donne Hazel. Left lumpectomy revealed grade 1 invasive lobular carcinoma measuring 2.1 cm and the tumor was focally less than 0.1 cm to the lateral margin but the inked marginal surface was negative for tumor. 4 biopsied left axillary sentinel lymph nodes were negative for metastatic disease. Oncotype DX score of 12; low risk with a 10-year risk of distant recurrence of 8% with Tamoxifen alone. Chemotherapy was not recommended.  The patient saw Dr. Hulen Skains on 03/03/17 because her breast was red and warm. The patient was provided with amoxicillin 875 mg to use for 1 week.  The patient presents today to further discuss radiation to the left breast as part of her breast conservation therapy.  PREVIOUS RADIATION THERAPY: No   PAST MEDICAL HISTORY:  Past Medical  History:  Diagnosis Date  . Arthritis   . Breast cancer ([place]) 01/15/2017   left breast  . Cancer ([place])   . GERD (gastroesophageal reflux disease)   . HLD (hyperlipidemia)   . Hypertension   . Hypothyroidism   . Obesity   . Overactive bladder   . Palpitation   . Thyroid disease        PAST SURGICAL HISTORY: Past Surgical History:  Procedure Laterality Date  . bladder stem stretch  1966  . BREAST LUMPECTOMY WITH RADIOACTIVE SEED AND SENTINEL LYMPH NODE BIOPSY Left 02/16/2017   Procedure: BREAST LUMPECTOMY WITH RADIOACTIVE SEED AND AXILLARY SENTINEL LYMPH NODE BIOPSY;  Surgeon: Rolm Bookbinder, MD;  Location: [facility];  Service: General;  Laterality: Left;  . JOINT REPLACEMENT    . KNEE ARTHROSCOPY     LEFT  . NOSE SURGERY  1980  . SHOULDER SURGERY Left 2007  . TONSILLECTOMY  1964  . TOTAL HIP ARTHROPLASTY Left 10/23/2015   Procedure: TOTAL HIP ARTHROPLASTY ANTERIOR APPROACH;  Surgeon: Melrose Nakayama, MD;  Location: [facility];  Service: Orthopedics;  Laterality: Left;     FAMILY HISTORY:  Family History  Problem Relation Age of Onset  . Heart Problems Father     cardiac arrest  . Heart attack Mother   . Heart disease Mother   . Cancer Maternal Uncle 48    colon cancer   . Cancer Maternal Grandmother     bladder cancer      SOCIAL HISTORY:  reports that she has never smoked. She has never used smokeless tobacco. She  reports that she does not drink alcohol or use drugs. The patient is single and lives in [place]. She works for a [employer].    ALLERGIES: Patient has no known allergies.   MEDICATIONS:  Current Outpatient Prescriptions  Medication Sig Dispense Refill  . acetaminophen (TYLENOL) 325 MG tablet Take 650 mg by mouth every 6 (six) hours as needed for mild pain.    Marland Kitchen amoxicillin (AMOXIL) 875 MG tablet Take 875 mg by mouth 2 (two) times daily. 874/1104m amoxicillin-clav    . hydrochlorothiazide (HYDRODIURIL) 25 MG tablet TAKE 1 TABLET  BY MOUTH EVERY DAY 30 tablet 3  . levothyroxine (SYNTHROID, LEVOTHROID) 88 MCG tablet Take 88 mcg by mouth daily. Take 2 tabs for 2 days  Alternate with 44 mcg for 1 day, then so forth.  12  . metoprolol succinate (TOPROL-XL) 25 MG 24 hr tablet TAKE 1 TABLET BY MOUTH EVERY DAY (Patient taking differently: TAKE 1/2 TABLET BY MOUTH EVERY DAY) 90 tablet 3  . pantoprazole (PROTONIX) 40 MG tablet Take 1 tablet (40 mg total) by mouth daily. Pt. needs to set up follow up appt. 30 tablet 0  . simvastatin (ZOCOR) 20 MG tablet Take 20 mg by mouth daily.    . clobetasol cream (TEMOVATE) 09.93% Apply 1 application topically 2 (two) times daily as needed (itching).     .Marland KitchenoxyCODONE-acetaminophen (PERCOCET) 10-325 MG tablet Take 1 tablet by mouth every 6 (six) hours as needed for pain. (Patient not taking: Reported on 03/05/2017) 12 tablet 0   No current facility-administered medications for this encounter.      REVIEW OF SYSTEMS: On review of systems, the patient reports that she is doing well overall. She reports pain in the left breast and axilla. She takes tylenol PRN. She reports bruising of the lateral left breast. The breast is yellow/green colored and has swelling. She denies shortness of breath, cough, fevers, chills, night sweats, unintended weight changes. She denies any bowel or bladder disturbances, and denies abdominal pain, nausea or vomiting. She denies any new musculoskeletal or joint aches or pains. A complete review of systems is obtained and is otherwise negative.    PHYSICAL EXAM:  Wt Readings from Last 3 Encounters:  03/05/17 200 lb 6.4 oz (90.9 kg)  02/16/17 202 lb (91.6 kg)  01/28/17 206 lb 4.8 oz (93.6 kg)   Temp Readings from Last 3 Encounters:  03/05/17 98.2 F (36.8 C) (Oral)  02/16/17 98.5 F (36.9 C) (Oral)  01/28/17 97.7 F (36.5 C) (Oral)   BP Readings from Last 3 Encounters:  03/05/17 129/77  02/16/17 122/76  01/28/17 135/77   Pulse Readings from Last 3 Encounters:    03/05/17 80  02/16/17 63  01/28/17 76   Pain Assessment Pain Score: 4  Pain Loc: Breast (left)/10  In general this is a well appearing Caucasian female in no acute distress. She is alert and oriented x4 and appropriate throughout the examination. HEENT reveals that the patient is normocephalic, atraumatic. The patient is hard of hearing. EOMs are intact. PERRLA. Skin is intact without any evidence of gross lesions. Cardiovascular exam reveals a regular rate and rhythm, no clicks rubs or murmurs are auscultated. Chest is clear to auscultation bilaterally. Lymphatic assessment is performed and does not reveal any adenopathy in the cervical, supraclavicular, or axillary chains. Abdomen has active bowel sounds in all quadrants and is intact. The abdomen is soft, non tender, non distended. Lower extremities are negative for pretibial pitting edema, deep calf tenderness,  cyanosis or clubbing. Bilateral breast exam reveals swelling in the left breast with associated bruising, no bright erythema or substantial warmth to the touch present.  ECOG = 0  0 - Asymptomatic (Fully active, able to carry on all predisease activities without restriction)  1 - Symptomatic but completely ambulatory (Restricted in physically strenuous activity but ambulatory and able to carry out work of a light or sedentary nature. For example, light housework, office work)  2 - Symptomatic, <50% in bed during the day (Ambulatory and capable of all self care but unable to carry out any work activities. Up and about more than 50% of waking hours)  3 - Symptomatic, >50% in bed, but not bedbound (Capable of only limited self-care, confined to bed or chair 50% or more of waking hours)  4 - Bedbound (Completely disabled. Cannot carry on any self-care. Totally confined to bed or chair)  5 - Death   Eustace Pen MM, Creech RH, Tormey DC, et al. [phone]). "Toxicity and response criteria of the [facility]". [place]  Oncol. 5 (6): 649-55    LABORATORY DATA:  Lab Results  Component Value Date   WBC 6.9 01/28/2017   HGB 13.7 01/28/2017   HCT 42.3 01/28/2017   MCV 87.9 01/28/2017   PLT 342 01/28/2017   Lab Results  Component Value Date   NA 136 02/11/2017   K 3.4 (L) 02/11/2017   CL 97 (L) 02/11/2017   CO2 26 02/11/2017   Lab Results  Component Value Date   ALT 17 01/28/2017   AST 17 01/28/2017   ALKPHOS 117 01/28/2017   BILITOT 0.68 01/28/2017      RADIOGRAPHY: Mm Breast Surgical Specimen  Result Date: 02/16/2017 CLINICAL DATA:  Biopsy proven grade 1 invasive lobular carcinoma, atypical lobular hyperplasia and microcalcifications in the left breast. EXAM: SPECIMEN RADIOGRAPH OF THE LEFT BREAST COMPARISON:  Previous exam(s). FINDINGS: Status post excision of the left breast. The radioactive seed and biopsy marker clip are present, completely intact, and were marked for pathology. IMPRESSION: Specimen radiograph of the left breast. Electronically Signed   By: Lillia Mountain M.D.   On: 02/16/2017 11:25   Mm Lt Radioactive Seed Loc Mammo Guide  Result Date: 02/13/2017 CLINICAL DATA:  Localization of left breast cancer EXAM: MAMMOGRAPHIC GUIDED RADIOACTIVE SEED LOCALIZATION OF THE LEFT BREAST COMPARISON:  Previous exam(s). FINDINGS: Patient presents for radioactive seed localization prior to surgery for a left breast cancer. I met with the patient and we discussed the procedure of seed localization including benefits and alternatives. We discussed the high likelihood of a successful procedure. We discussed the risks of the procedure including infection, bleeding, tissue injury and further surgery. We discussed the low dose of radioactivity involved in the procedure. Informed, written consent was given. The usual time-out protocol was performed immediately prior to the procedure. Using mammographic guidance, sterile technique, 1% lidocaine and an I-125 radioactive seed, the left breast cancer was  localized using a lateral approach. The follow-up mammogram images confirm the seed in the expected location and were marked for the surgeon. Follow-up survey of the patient confirms presence of the radioactive seed. Order number of I-125 seed:  [id number]. Total activity: 6.812 millicuries Reference Date: February 03, 2017 The patient tolerated the procedure well and was released from the [facility]. She was given instructions regarding seed removal. IMPRESSION: Radioactive seed localization left breast. No apparent complications. Electronically Signed   By: Dorise Bullion III M.D   On: 02/13/2017 13:26  IMPRESSION/PLAN: 1. 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 I discussed the pathology findings and reviewed the nature of invasive disease. We discussed the risks, benefits, short, and long term effects of radiotherapy, and the patient is interested in proceeding. We discussed the delivery and logistics of radiotherapy. Her course would be 6 1/2 weeks. We also discussed consideration of breath hold technique due to the left sided disease. The patient's surgery was on 02/16/17, approximately 2 1/2 weeks ago. She is scheduled to see her surgeon, Dr. Donne Hazel next week. She will call us after this visit to let us know how her evaluation went and whether it is felt that she is suitable to proceed with simulation. She does have some further healing to go, with some swelling, and has been on antibiotics. We anticipate her to return afterwards to move forward with the simulation and planning process and anticipate starting radiotherapy the week thereafter.  ------------------------------------------------  Jodelle Gross, MD, PhD  This document serves as a record of services personally performed by Kyung Rudd, MD. It was created on  his behalf by Darcus Austin, a trained medical scribe. The creation of this record is based on the scribe's personal observations and the provider's statements to them. This document has been checked and approved by the attending provider.

## 2017-03-09 ENCOUNTER — Other Ambulatory Visit: Payer: Self-pay | Admitting: General Surgery

## 2017-03-09 ENCOUNTER — Ambulatory Visit
Admission: RE | Admit: 2017-03-09 | Discharge: 2017-03-09 | Disposition: A | Discharge status: Home / Self Care | Payer: Commercial Managed Care - PPO | Source: Ambulatory Visit | Attending: General Surgery | Admitting: General Surgery

## 2017-03-09 DIAGNOSIS — N6489 Other specified disorders of breast: Secondary | ICD-10-CM

## 2017-03-10 ENCOUNTER — Ambulatory Visit: Payer: Commercial Managed Care - PPO | Admitting: Physical Therapy

## 2017-03-12 ENCOUNTER — Other Ambulatory Visit: Payer: Self-pay | Admitting: Nurse Practitioner

## 2017-03-12 ENCOUNTER — Telehealth: Payer: Self-pay | Admitting: *Deleted

## 2017-03-12 NOTE — Telephone Encounter (Signed)
Returned voice message from the patient, she stated 03/09/17 Dr. Enriqueta Shutter aspirated 600cc bloody fluid from her breast and didn't get all of it, follow up tomorrow with Dr. Donne Hazel, may need surgery if cannot get rest of fluid off","also she stated 2 more areas in her breast have become hardened as well, and she thanked this Rn for the pillow that was given  To her it has been very helpful", she doesn't want to come in for a ct simulation until after Dr. Donne Hazel clears her , asked her to call us when she can reschedule, she will call me Monday to let me know how things went,thanked me for returning her call so soon, .  Informed Dr. Benjie Karvonen and Ct therapist Aaron Edelman and Amy 2:57 PM

## 2017-03-17 ENCOUNTER — Other Ambulatory Visit: Payer: Self-pay | Admitting: General Surgery

## 2017-03-18 ENCOUNTER — Encounter (HOSPITAL_BASED_OUTPATIENT_CLINIC_OR_DEPARTMENT_OTHER): Payer: Self-pay | Admitting: Certified Registered"

## 2017-03-18 ENCOUNTER — Ambulatory Visit (HOSPITAL_BASED_OUTPATIENT_CLINIC_OR_DEPARTMENT_OTHER): Payer: Commercial Managed Care - PPO | Admitting: Certified Registered"

## 2017-03-18 ENCOUNTER — Ambulatory Visit (HOSPITAL_BASED_OUTPATIENT_CLINIC_OR_DEPARTMENT_OTHER)
Admission: RE | Admit: 2017-03-18 | Discharge: 2017-03-18 | Disposition: A | Discharge status: Home / Self Care | Payer: Commercial Managed Care - PPO | Source: Ambulatory Visit | Attending: General Surgery | Admitting: General Surgery

## 2017-03-18 ENCOUNTER — Encounter (HOSPITAL_BASED_OUTPATIENT_CLINIC_OR_DEPARTMENT_OTHER)
Admission: RE | Disposition: A | Discharge status: Home / Self Care | Payer: Self-pay | Source: Ambulatory Visit | Attending: General Surgery

## 2017-03-18 DIAGNOSIS — Z853 Personal history of malignant neoplasm of breast: Secondary | ICD-10-CM | POA: Insufficient documentation

## 2017-03-18 DIAGNOSIS — E669 Obesity, unspecified: Secondary | ICD-10-CM | POA: Diagnosis not present

## 2017-03-18 DIAGNOSIS — Y838 Other surgical procedures as the cause of abnormal reaction of the patient, or of later complication, without mention of misadventure at the time of the procedure: Secondary | ICD-10-CM | POA: Insufficient documentation

## 2017-03-18 DIAGNOSIS — E039 Hypothyroidism, unspecified: Secondary | ICD-10-CM | POA: Diagnosis not present

## 2017-03-18 DIAGNOSIS — Z79899 Other long term (current) drug therapy: Secondary | ICD-10-CM | POA: Diagnosis not present

## 2017-03-18 DIAGNOSIS — Z96642 Presence of left artificial hip joint: Secondary | ICD-10-CM | POA: Insufficient documentation

## 2017-03-18 DIAGNOSIS — Z6832 Body mass index (BMI) 32.0-32.9, adult: Secondary | ICD-10-CM | POA: Diagnosis not present

## 2017-03-18 DIAGNOSIS — K219 Gastro-esophageal reflux disease without esophagitis: Secondary | ICD-10-CM | POA: Diagnosis not present

## 2017-03-18 DIAGNOSIS — L7634 Postprocedural seroma of skin and subcutaneous tissue following other procedure: Secondary | ICD-10-CM | POA: Diagnosis not present

## 2017-03-18 DIAGNOSIS — I10 Essential (primary) hypertension: Secondary | ICD-10-CM | POA: Insufficient documentation

## 2017-03-18 DIAGNOSIS — N6489 Other specified disorders of breast: Secondary | ICD-10-CM | POA: Diagnosis not present

## 2017-03-18 DIAGNOSIS — E785 Hyperlipidemia, unspecified: Secondary | ICD-10-CM | POA: Insufficient documentation

## 2017-03-18 HISTORY — PX: EVACUATION BREAST HEMATOMA: SHX1537

## 2017-03-18 SURGERY — EVACUATION, HEMATOMA, BREAST
Anesthesia: General | Site: Breast | Laterality: Left

## 2017-03-18 MED ORDER — CEFAZOLIN SODIUM-DEXTROSE 2-4 GM/100ML-% IV SOLN
2.0000 g | INTRAVENOUS | Status: AC
  Administered 2017-03-18: 2 g via INTRAVENOUS

## 2017-03-18 MED ORDER — OXYCODONE HCL 5 MG/5ML PO SOLN: 5.0000 mg | Freq: Once | ORAL | Status: AC | PRN

## 2017-03-18 MED ORDER — PROMETHAZINE HCL 25 MG/ML IJ SOLN: 6.2500 mg | INTRAMUSCULAR | Status: DC | PRN

## 2017-03-18 MED ORDER — SCOPOLAMINE 1 MG/3DAYS TD PT72: 1.0000 | MEDICATED_PATCH | Freq: Once | TRANSDERMAL | Status: DC

## 2017-03-18 MED ORDER — LIDOCAINE HCL (CARDIAC) 20 MG/ML IV SOLN
INTRAVENOUS | Status: DC | PRN
  Administered 2017-03-18: 60 mg via INTRAVENOUS

## 2017-03-18 MED ORDER — FENTANYL CITRATE (PF) 100 MCG/2ML IJ SOLN
INTRAMUSCULAR | Status: AC
  Filled 2017-03-18: qty 2

## 2017-03-18 MED ORDER — GABAPENTIN 300 MG PO CAPS
300.0000 mg | ORAL_CAPSULE | ORAL | Status: AC
  Administered 2017-03-18: 300 mg via ORAL

## 2017-03-18 MED ORDER — ONDANSETRON HCL 4 MG/2ML IJ SOLN
INTRAMUSCULAR | Status: DC | PRN
  Administered 2017-03-18: 4 mg via INTRAVENOUS

## 2017-03-18 MED ORDER — LACTATED RINGERS IV SOLN
INTRAVENOUS | Status: DC
  Administered 2017-03-18 (×2): via INTRAVENOUS

## 2017-03-18 MED ORDER — FENTANYL CITRATE (PF) 100 MCG/2ML IJ SOLN
50.0000 ug | INTRAMUSCULAR | Status: AC | PRN
  Administered 2017-03-18 (×2): 50 ug via INTRAVENOUS
  Administered 2017-03-18: 25 ug via INTRAVENOUS
  Administered 2017-03-18: 50 ug via INTRAVENOUS
  Administered 2017-03-18: 25 ug via INTRAVENOUS

## 2017-03-18 MED ORDER — GABAPENTIN 300 MG PO CAPS
ORAL_CAPSULE | ORAL | Status: AC
  Filled 2017-03-18: qty 1

## 2017-03-18 MED ORDER — OXYCODONE HCL 5 MG PO TABS
5.0000 mg | ORAL_TABLET | Freq: Once | ORAL | Status: AC | PRN
  Administered 2017-03-18: 5 mg via ORAL

## 2017-03-18 MED ORDER — ACETAMINOPHEN 500 MG PO TABS
1000.0000 mg | ORAL_TABLET | ORAL | Status: AC
  Administered 2017-03-18: 1000 mg via ORAL

## 2017-03-18 MED ORDER — ACETAMINOPHEN 500 MG PO TABS
ORAL_TABLET | ORAL | Status: AC
  Filled 2017-03-18: qty 2

## 2017-03-18 MED ORDER — PROPOFOL 10 MG/ML IV BOLUS
INTRAVENOUS | Status: DC | PRN
  Administered 2017-03-18: 200 mg via INTRAVENOUS

## 2017-03-18 MED ORDER — HYDROMORPHONE HCL 1 MG/ML IJ SOLN
0.2500 mg | INTRAMUSCULAR | Status: DC | PRN
  Administered 2017-03-18: 0.5 mg via INTRAVENOUS

## 2017-03-18 MED ORDER — HYDROCODONE-ACETAMINOPHEN 10-325 MG PO TABS
1.0000 | ORAL_TABLET | Freq: Four times a day (QID) | ORAL | 0 refills | Status: DC | PRN
Start: 2017-03-18 — End: 2017-06-25

## 2017-03-18 MED ORDER — MIDAZOLAM HCL 2 MG/2ML IJ SOLN
INTRAMUSCULAR | Status: AC
  Filled 2017-03-18: qty 2

## 2017-03-18 MED ORDER — FENTANYL CITRATE (PF) 100 MCG/2ML IJ SOLN: 50.0000 ug | INTRAMUSCULAR | Status: DC | PRN

## 2017-03-18 MED ORDER — MIDAZOLAM HCL 2 MG/2ML IJ SOLN: 1.0000 mg | INTRAMUSCULAR | Status: DC | PRN

## 2017-03-18 MED ORDER — BACITRACIN ZINC 500 UNIT/GM EX OINT
TOPICAL_OINTMENT | CUTANEOUS | Status: AC
  Filled 2017-03-18: qty 1.8

## 2017-03-18 MED ORDER — OXYCODONE HCL 5 MG PO TABS
ORAL_TABLET | ORAL | Status: AC
  Filled 2017-03-18: qty 1

## 2017-03-18 MED ORDER — SCOPOLAMINE 1 MG/3DAYS TD PT72: 1.0000 | MEDICATED_PATCH | Freq: Once | TRANSDERMAL | Status: DC | PRN

## 2017-03-18 MED ORDER — MIDAZOLAM HCL 2 MG/2ML IJ SOLN
1.0000 mg | INTRAMUSCULAR | Status: DC | PRN
  Administered 2017-03-18: 2 mg via INTRAVENOUS

## 2017-03-18 MED ORDER — HYDROMORPHONE HCL 1 MG/ML IJ SOLN
INTRAMUSCULAR | Status: AC
  Filled 2017-03-18: qty 1

## 2017-03-18 MED ORDER — MEPERIDINE HCL 25 MG/ML IJ SOLN: 6.2500 mg | INTRAMUSCULAR | Status: DC | PRN

## 2017-03-18 MED ORDER — CEFAZOLIN SODIUM-DEXTROSE 2-4 GM/100ML-% IV SOLN
INTRAVENOUS | Status: AC
  Filled 2017-03-18: qty 100

## 2017-03-18 MED ORDER — LACTATED RINGERS IV SOLN
INTRAVENOUS | Status: DC
  Administered 2017-03-18: 11:00:00 via INTRAVENOUS

## 2017-03-18 SURGICAL SUPPLY — 54 items
ADH SKN CLS APL DERMABOND .7 (GAUZE/BANDAGES/DRESSINGS)
BINDER BREAST LRG (GAUZE/BANDAGES/DRESSINGS) IMPLANT
BINDER BREAST MEDIUM (GAUZE/BANDAGES/DRESSINGS) IMPLANT
BINDER BREAST XLRG (GAUZE/BANDAGES/DRESSINGS) IMPLANT
BINDER BREAST XXLRG (GAUZE/BANDAGES/DRESSINGS) ×1 IMPLANT
BLADE SURG 15 STRL LF DISP TIS (BLADE) ×1 IMPLANT
BLADE SURG 15 STRL SS (BLADE) ×2
CANISTER SUCT 1200ML W/VALVE (MISCELLANEOUS) ×1 IMPLANT
CHLORAPREP W/TINT 26ML (MISCELLANEOUS) ×2 IMPLANT
CLIP TI WIDE RED SMALL 6 (CLIP) IMPLANT
COVER BACK TABLE 60X90IN (DRAPES) ×2 IMPLANT
COVER MAYO STAND STRL (DRAPES) ×2 IMPLANT
DECANTER SPIKE VIAL GLASS SM (MISCELLANEOUS) IMPLANT
DERMABOND ADVANCED (GAUZE/BANDAGES/DRESSINGS)
DERMABOND ADVANCED .7 DNX12 (GAUZE/BANDAGES/DRESSINGS) IMPLANT
DEVICE DUBIN W/COMP PLATE 8390 (MISCELLANEOUS) IMPLANT
DRAPE LAPAROSCOPIC ABDOMINAL (DRAPES) ×2 IMPLANT
DRSG TEGADERM 4X4.75 (GAUZE/BANDAGES/DRESSINGS) ×2 IMPLANT
ELECT COATED BLADE 2.86 ST (ELECTRODE) ×2 IMPLANT
ELECT REM PT RETURN 9FT ADLT (ELECTROSURGICAL) ×2
ELECTRODE REM PT RTRN 9FT ADLT (ELECTROSURGICAL) ×1 IMPLANT
GAUZE SPONGE 4X4 12PLY STRL LF (GAUZE/BANDAGES/DRESSINGS) ×2 IMPLANT
GLOVE BIO SURGEON STRL SZ 6.5 (GLOVE) ×1 IMPLANT
GLOVE BIO SURGEON STRL SZ7 (GLOVE) ×3 IMPLANT
GLOVE BIOGEL PI IND STRL 7.5 (GLOVE) ×1 IMPLANT
GLOVE BIOGEL PI INDICATOR 7.5 (GLOVE) ×1
GOWN STRL REUS W/ TWL LRG LVL3 (GOWN DISPOSABLE) ×3 IMPLANT
GOWN STRL REUS W/TWL LRG LVL3 (GOWN DISPOSABLE) ×6
HEMOSTAT ARISTA ABSORB 3G PWDR (MISCELLANEOUS) ×2 IMPLANT
ILLUMINATOR WAVEGUIDE N/F (MISCELLANEOUS) ×1 IMPLANT
LIGHT WAVEGUIDE WIDE FLAT (MISCELLANEOUS) IMPLANT
NDL HYPO 25X1 1.5 SAFETY (NEEDLE) ×1 IMPLANT
NEEDLE HYPO 25X1 1.5 SAFETY (NEEDLE) ×2 IMPLANT
NS IRRIG 1000ML POUR BTL (IV SOLUTION) ×1 IMPLANT
PACK BASIN DAY SURGERY FS (CUSTOM PROCEDURE TRAY) ×2 IMPLANT
PENCIL BUTTON HOLSTER BLD 10FT (ELECTRODE) ×2 IMPLANT
SLEEVE SCD COMPRESS KNEE MED (MISCELLANEOUS) ×2 IMPLANT
SPONGE LAP 4X18 X RAY DECT (DISPOSABLE) ×2 IMPLANT
STRIP CLOSURE SKIN 1/2X4 (GAUZE/BANDAGES/DRESSINGS) IMPLANT
SUT ETHILON 3 0 PS 1 (SUTURE) ×2 IMPLANT
SUT MNCRL AB 3-0 PS2 18 (SUTURE) IMPLANT
SUT MNCRL AB 4-0 PS2 18 (SUTURE) ×1 IMPLANT
SUT SILK 2 0 SH (SUTURE) ×1 IMPLANT
SUT VIC AB 2-0 SH 27 (SUTURE) ×4
SUT VIC AB 2-0 SH 27XBRD (SUTURE) ×1 IMPLANT
SUT VIC AB 3-0 SH 27 (SUTURE) ×2
SUT VIC AB 3-0 SH 27X BRD (SUTURE) ×1 IMPLANT
SUT VIC AB 5-0 PS2 18 (SUTURE) IMPLANT
SUT VICRYL AB 3 0 TIES (SUTURE) IMPLANT
SYR CONTROL 10ML LL (SYRINGE) ×2 IMPLANT
TOWEL OR 17X24 6PK STRL BLUE (TOWEL DISPOSABLE) ×2 IMPLANT
TOWEL OR NON WOVEN STRL DISP B (DISPOSABLE) ×2 IMPLANT
TUBE CONNECTING 20X1/4 (TUBING) ×1 IMPLANT
YANKAUER SUCT BULB TIP NO VENT (SUCTIONS) ×1 IMPLANT

## 2017-03-18 NOTE — H&P (Signed)
[name] is an 61 y.o. female.   Chief Complaint: left breast seroma/hematoma HPI: 21 yof who underwent left lumpectomy/sn biopsy on 2/26 for stage I ILC. this was complicated in delayed fashion by hematoma/seroma. this was observed with compression and enlarged. her initial pathology is a grade I ILC that is 2.1 cm with lcis and alh. all margins are clear and nodes are negative. her oncotype is 12. this area got better. she had an [place] on 3/19 that showed a large complex echogenicity fluid collection and this was drained with 600 cc. she initially felt better but this has since reaccumululated and is now coming out of her incision.      Past Medical History:  Diagnosis Date  . Arthritis   . Breast cancer ([place]) 01/15/2017   left breast  . Cancer ([place])   . GERD (gastroesophageal reflux disease)   . HLD (hyperlipidemia)   . Hypertension   . Hypothyroidism   . Obesity   . Overactive bladder   . Palpitation   . Thyroid disease     Past Surgical History:  Procedure Laterality Date  . bladder stem stretch  1966  . BREAST LUMPECTOMY WITH RADIOACTIVE SEED AND SENTINEL LYMPH NODE BIOPSY Left 02/16/2017   Procedure: BREAST LUMPECTOMY WITH RADIOACTIVE SEED AND AXILLARY SENTINEL LYMPH NODE BIOPSY;  Surgeon: Rolm Bookbinder, MD;  Location: [facility];  Service: General;  Laterality: Left;  . JOINT REPLACEMENT    . KNEE ARTHROSCOPY     LEFT  . NOSE SURGERY  1980  . SHOULDER SURGERY Left 2007  . TONSILLECTOMY  1964  . TOTAL HIP ARTHROPLASTY Left 10/23/2015   Procedure: TOTAL HIP ARTHROPLASTY ANTERIOR APPROACH;  Surgeon: Melrose Nakayama, MD;  Location: [facility];  Service: Orthopedics;  Laterality: Left;    Family History  Problem Relation Age of Onset  . Heart Problems Father     cardiac arrest  . Heart attack Mother   . Heart disease Mother   . Cancer Maternal Uncle 30    colon cancer   . Cancer Maternal Grandmother     bladder cancer    Social History:   reports that she has never smoked. She has never used smokeless tobacco. She reports that she does not drink alcohol or use drugs.  Allergies: No Known Allergies  No prescriptions prior to admission.    No results found for this or any previous visit (from the past 48 hour(s)). No results found.  Review of Systems  Constitutional: Negative for fever.  All other systems reviewed and are negative.   There were no vitals taken for this visit. Physical Exam    Vitals (Alisha Spillers CMA; 03/17/2017 4:26 PM) 03/17/2017 4:26 PM Weight: 201 lb Height: 66in Body Surface Area: 2 m Body Mass Index: 32.44 kg/m  BP: 128/74 (Sitting, Left Arm, Standard) Physical Exam Rolm Bookbinder MD; 03/17/2017 4:55 PM) General Mental Status-Alert. Orientation-Oriented X3. cv rrr pulm clear bilaterally Breast Note: left breast hematoma with serosang drainage from left axilla   Assessment & Plan Rolm Bookbinder MD; 03/17/2017 4:55 PM) POSTOPERATIVE STATE [phone]) Story: I am not sure why this occurred delayed but I think this has failed conservative therapy now. I think she needs to go to or tomorrow and have this evacuated with placement of a drain.  Rolm Bookbinder, MD 03/18/2017, 8:13 AM

## 2017-03-18 NOTE — Discharge Instructions (Signed)
[facility] Office Phone Number [phone] POST OP INSTRUCTIONS  Always review your discharge instruction sheet given to you by the facility where your surgery was performed.  IF YOU HAVE DISABILITY OR FAMILY LEAVE FORMS, YOU MUST BRING THEM TO THE OFFICE FOR PROCESSING.  DO NOT GIVE THEM TO YOUR DOCTOR.  1. A prescription for pain medication may be given to you upon discharge.  Take your pain medication as prescribed, if needed.  If narcotic pain medicine is not needed, then you may take acetaminophen (Tylenol), naprosyn (Alleve) or ibuprofen (Advil) as needed. 2. Take your usually prescribed medications unless otherwise directed 3. If you need a refill on your pain medication, please contact your pharmacy.  They will contact our office to request authorization.  Prescriptions will not be filled after 5pm or on week-ends. 4. You should eat very light the first 24 hours after surgery, such as soup, crackers, pudding, etc.  Resume your normal diet the day after surgery. 5. Most patients will experience some swelling and bruising in the breast.  Ice packs and a good support bra will help.  Wear the breast binder provided or a sports bra for 72 hours day and night.  After that wear a sports bra during the day until you return to the office. Swelling and bruising can take several days to resolve.  6. It is common to experience some constipation if taking pain medication after surgery.  Increasing fluid intake and taking a stool softener will usually help or prevent this problem from occurring.  A mild laxative (Milk of Magnesia or Miralax) should be taken according to package directions if there are no bowel movements after 48 hours. 7. May change dressing as needed under arm. Start once daily two days from surgery. Keep dressing around drain. Do not shower until you return to office and are cleared. Keep track of drain output daily and bring that back to office.  8. ACTIVITIES:  You may  resume regular daily activities (gradually increasing) beginning the next day.  Wearing a good support bra or sports bra minimizes pain and swelling.  You may have sexual intercourse when it is comfortable. a. You may drive when you no longer are taking prescription pain medication, you can comfortably wear a seatbelt, and you can safely maneuver your car and apply brakes. b. RETURN TO WORK:  ______________________________________________________________________________________ 9. The area will look like it has a hole there for now. This will improve later when drain removed 10. OTHER INSTRUCTIONS: _______________________________________________________________________________________________ _____________________________________________________________________________________________________________________________________ _____________________________________________________________________________________________________________________________________ _____________________________________________________________________________________________________________________________________  WHEN TO CALL DR [place]: 1. Fever over 101.0 2. Nausea and/or vomiting. 3. Extreme swelling or bruising. 4. Continued bleeding from incision. 5. Increased pain, redness, or drainage from the incision.  The clinic staff is available to answer your questions during regular business hours.  Please dont hesitate to call and ask to speak to one of the nurses for clinical concerns.  If you have a medical emergency, go to the nearest emergency room or call 911.  A surgeon from [facility] is always on call at the hospital.  For further questions, please visit [email or web address]     Post Anesthesia Home Care Instructions  Activity: Get plenty of rest for the remainder of the day. A responsible individual must stay with you for 24 hours following the procedure.  For the next 24 hours, DO  NOT: -Drive a car -Paediatric nurse -Drink alcoholic beverages -Take any medication unless instructed by your physician -Make any legal decisions or  sign important papers.  Meals: Start with liquid foods such as gelatin or soup. Progress to regular foods as tolerated. Avoid greasy, spicy, heavy foods. If nausea and/or vomiting occur, drink only clear liquids until the nausea and/or vomiting subsides. Call your physician if vomiting continues.  Special Instructions/Symptoms: Your throat may feel dry or sore from the anesthesia or the breathing tube placed in your throat during surgery. If this causes discomfort, gargle with warm salt water. The discomfort should disappear within 24 hours.  If you had a scopolamine patch placed behind your ear for the management of post- operative nausea and/or vomiting:  1. The medication in the patch is effective for 72 hours, after which it should be removed.  Wrap patch in a tissue and discard in the trash. Wash hands thoroughly with soap and water. 2. You may remove the patch earlier than 72 hours if you experience unpleasant side effects which may include dry mouth, dizziness or visual disturbances. 3. Avoid touching the patch. Wash your hands with soap and water after contact with the patch.

## 2017-03-18 NOTE — Transfer of Care (Signed)
Immediate Anesthesia Transfer of Care Note  Patient: [name]  Procedure(s) Performed: Procedure(s): EVACUATION SEROMA LEFT BREAST (Left)  Patient Location: PACU  Anesthesia Type:General  Level of Consciousness: awake, alert , oriented and patient cooperative  Airway & Oxygen Therapy: Patient Spontanous Breathing and Patient connected to face mask oxygen  Post-op Assessment: Report given to RN and Post -op Vital signs reviewed and stable  Post vital signs: Reviewed and stable  Last Vitals:  Vitals:   03/18/17 1028  BP: 120/71  Pulse: 63  Resp: 18  Temp: 36.9 C    Last Pain:  Vitals:   03/18/17 1028  TempSrc: Oral  PainSc: 4       Patients Stated Pain Goal: 2 (21/58/72 7618)  Complications: No apparent anesthesia complications

## 2017-03-18 NOTE — Interval H&P Note (Signed)
History and Physical Interval Note:  03/18/2017 10:54 AM  [name]  has presented today for surgery, with the diagnosis of LEFT BREAST SEROMA  The various methods of treatment have been discussed with the patient and family. After consideration of risks, benefits and other options for treatment, the patient has consented to  Procedure(s): EVACUATION SEROMA LEFT BREAST (Left) as a surgical intervention .  The patient's history has been reviewed, patient examined, no change in status, stable for surgery.  I have reviewed the patient's chart and labs.  Questions were answered to the patient's satisfaction.     Deshonna Trnka

## 2017-03-18 NOTE — Anesthesia Procedure Notes (Signed)
Procedure Name: LMA Insertion Date/Time: 03/18/2017 11:15 AM Performed by: Nikitia Asbill D Pre-anesthesia Checklist: Patient identified, Emergency Drugs available, Suction available and Patient being monitored Patient Re-evaluated:Patient Re-evaluated prior to inductionOxygen Delivery Method: Circle system utilized Preoxygenation: Pre-oxygenation with 100% oxygen Intubation Type: IV induction Ventilation: Mask ventilation without difficulty LMA: LMA inserted LMA Size: 4.0 Number of attempts: 1 Airway Equipment and Method: Bite block Placement Confirmation: positive ETCO2 Tube secured with: Tape Dental Injury: Teeth and Oropharynx as per pre-operative assessment

## 2017-03-18 NOTE — Progress Notes (Signed)
Patient ID: [name], female   DOB: 18-Sep-1956, 61 y.o.   MRN: [id number]  Narcotic database reviewed today, has postop pain med from our last surgery

## 2017-03-18 NOTE — Anesthesia Preprocedure Evaluation (Signed)
Anesthesia Evaluation  Patient identified by MRN, date of birth, ID band Patient awake    Reviewed: Allergy & Precautions, H&P , NPO status , Patient's Chart, lab work & pertinent test results  Airway Mallampati: II   Neck ROM: full    Dental   Pulmonary    breath sounds clear to auscultation       Cardiovascular hypertension,  Rhythm:regular Rate:Normal     Neuro/Psych  Neuromuscular disease    GI/Hepatic GERD  ,  Endo/Other  Hypothyroidism   Renal/GU      Musculoskeletal  (+) Arthritis ,   Abdominal   Peds  Hematology   Anesthesia Other Findings   Reproductive/Obstetrics                             Anesthesia Physical  Anesthesia Plan  ASA: II  Anesthesia Plan: General   Post-op Pain Management:    Induction: Intravenous  Airway Management Planned: LMA  Additional Equipment:   Intra-op Plan:   Post-operative Plan: Extubation in OR  Informed Consent: I have reviewed the patients History and Physical, chart, labs and discussed the procedure including the risks, benefits and alternatives for the proposed anesthesia with the patient or authorized representative who has indicated his/her understanding and acceptance.   Dental advisory given  Plan Discussed with: CRNA  Anesthesia Plan Comments:         Anesthesia Quick Evaluation

## 2017-03-18 NOTE — Op Note (Signed)
Preoperative diagnosis: Status post left breast lumpectomy sentinel node with seroma Postoperative diagnosis: Same as above Procedure: Evacuation of left breast seroma and placement of drain Surgeon: Dr. Serita Grammes Anesthesia: Gen. Specimens: None Estimated blood loss: Minimal Complications: None Drains: 48 French Blake drain to breast cavity Sponge count correct at completion Disposition to recovery stable  Indications: This is a 61 year old female who is one month status post a lumpectomy and sentinel node for a stage I invasive lobular cancer. She has a low oncotype and needs to begin radiation. She had a delayed seroma that presented. This was aspirated by radiology once it has recurred. It is now draining out of her incision. I discussed going to the operating room for evacuation and drain placement.  Procedure: After informed consent was obtained she was taken to the operating room. She was given antibiotics. SCDs were in place. She was placed under general anesthesia without complication. Her left breast and axilla were prepped and draped in the standard sterile surgical fashion. A surgical timeout was then performed.  I reentered her old incision. I drained a seroma with old blood from her axilla. This was fairly minor. I then drained a much larger cavity in the breast. There was at least 500 mL of serosanguinous fluid present. This cavity stretched all the way over to the other side of her breast as it clearly look like it could lift the breast tissue off the pectoralis major muscle. I then looked in the cavity. I could not see anything that was bleeding at all. I then placed Arista in the cavity as well as in the axilla. I then made a stab incision and placed a 19 [place] Blake drain in the breast cavity. This was secured with a 2-0 nylon suture. I then closed the tissue with 2-0 Vicryl. 3-0 Vicryl was used for the dermis. I used a 4-0 Monocryl to close the skin with some external 3-0  nylon sutures as well. A Biopatch was placed over the drain. Bacitracin was placed over the wound as well as a dressing. She tolerated as well as transferred to recovery.

## 2017-03-19 ENCOUNTER — Encounter (HOSPITAL_BASED_OUTPATIENT_CLINIC_OR_DEPARTMENT_OTHER): Payer: Self-pay | Admitting: General Surgery

## 2017-03-19 NOTE — Anesthesia Postprocedure Evaluation (Signed)
Anesthesia Post Note  Patient: [name]  Procedure(s) Performed: Procedure(s) (LRB): EVACUATION SEROMA LEFT BREAST (Left)  Patient location during evaluation: PACU Anesthesia Type: General Level of consciousness: sedated and patient cooperative Pain management: pain level controlled Vital Signs Assessment: post-procedure vital signs reviewed and stable Respiratory status: spontaneous breathing Cardiovascular status: stable Anesthetic complications: no       Last Vitals:  Vitals:   03/18/17 1232 03/18/17 1317  BP:  120/73  Pulse: 71 64  Resp: 11 18  Temp:  36.5 C    Last Pain:  Vitals:   03/18/17 1317  TempSrc:   PainSc: [place]

## 2017-04-09 NOTE — Progress Notes (Signed)
Location of Breast Cancer:Left Breast Upper Outer Quadrant      04-06-17 Saw Dr. Rolm Bookbinder for post-operative visit  And removed the JP drain she is fine.  FUN to discuss simulation and planning.  Histology per Pathology Report:  Diagnosis 01/15/2017 Breast, left, needle core biopsy, 2:00 o'clock - INVASIVE LOBULAR CARCINOMA.- ATYPICAL LOBULAR HYPERPLASIA.- MICROCALCIFICATIONS PRESENT  Receptor Status: ER(90%+), PR (90%), Her2-neu (neg ratio=1.24), Ki-67(1%)  Did patient present with symptoms (if so, please note symptoms) or was this found on screening mammography?: routine screening mammogram  Surgeon: 2/26/218: DR> Rolm Bookbinder, MDREPORT OF SURGICAL PATHOLOGY SS: Surgeon:  Dr. Rolm Bookbinder, MD 02/16/2017: saw Dr. Winifred Olive 03/03/17 breast warm and reddened, follow up next Monday 03/09/17,03-18-17 Had evacuation seroma left breast 1. Breast, lumpectomy, Left - INVASIVE GRADE I LOBULAR CARCINOMA. - TUMOR IS ESTIMATED TO SPAN AT LEAST 2.1 CM IN GREATEST DIMENSION. 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 - INVASIVE TUMOR IS FOCALLY LESS THAN 0.1 CM TO LATERAL MARGIN BUT INKED MARGINAL SURFACE IS NEGATIVE FOR TUMOR. - INITIAL ANTERIOR MARGIN CANNOT BE EFFECTIVELY CLEARED OF INVASIVE TUMOR, PLEASE SEE SPECIMEN #10 FOR FINAL MARGIN STATUS. - SEE ONCOLOGY TEMPLATE. 2. Lymph node, sentinel, biopsy, Left Axillary - ONE BENIGN LYMPH NODE NODE WITH NO TUMOR SEEN (0/1). - SEE COMMENT. 3. Lymph node, sentinel, biopsy, Left Axillary - ONE BENIGN LYMPH NODE NODE WITH NO TUMOR SEEN (0/1). - SEE COMMENT. 4. Lymph node, sentinel, biopsy, Left Axillary - ONE BENIGN LYMPH NODE NODE WITH NO TUMOR SEEN (0/1). - SEE COMMENT. 5. Lymph node, sentinel, biopsy, Left Axillary - ONE BENIGN LYMPH NODE NODE WITH NO TUMOR SEEN (0/1). - SEE COMMENT. 6. Breast, excision, Left additional Posterior Margin - BENIGN BREAST PARENCHYMA AND FIBROFATTY SOFT TISSUE. - NO ATYPIA  OR TUMOR SEEN. 7. Breast, excision, Left additional Superior Margin - BENIGN BREAST PARENCHYMA AND FIBROFATTY SOFT TISSUE. - NO ATYPIA OR TUMOR SEEN. 8. Breast, excision, Left additional Inferior Margin - BREAST PARENCHYMA SHOWING USUAL DUCTAL HYPERPLASIA. - INCIDENTAL HYALINIZED FIBROADENOMA WITH CALCIFICATIONS. - NO ATYPIA OR TUMOR SEEN. 1 of 5 FINAL for Schweiger, Hokulani L ([id number]) Diagnosis(continued) 9. Breast, excision, Left additional Medial Margin - BENIGN BREAST PARENCHYMA AND FIBROFATTY SOFT TISSUE. - NO ATYPIA OR TUMOR SEEN. 10. Breast, excision, Left additional Anterior Margin - BENIGN FIBROFATTY SOFT TISSUE. - NO ATYPIA OR TUMOR SEEN.  Past/Anticipated interventions by medical oncology, if any: , Oncotype results =12, no MD scheduled as yet  Lymphedema issues, if any: None, 04-10-17 Saw PT on Friday for measurements  Pain issues, if ZES:PQZRAQ pain , does take Tylenol for left breast pain, takes tylenol prn, axilla , has hematoma lateral side of breast, bruising all over breast yellow/green colored , swelling on antibiotics started 03/03/17 amoxicillin -clav for 7 days,  SAFETY ISSUES:  Prior radiation? NO  Pacemaker/ICD? NO  Possible current pregnancy: NO Wt Readings from Last 3 Encounters:  04/13/17 201 lb (91.2 kg)  03/18/17 200 lb (90.7 kg)  03/05/17 200 lb 6.4 oz (90.9 kg)  Skin to left breast intact has some harden to the outer breast drain site and surgical site healing signs of infection,mentioned still having tenderness and shooting pains at times color normal. Current Complaints / other details: Single,   Menarche age 61, G75P1, birth age 35, non smoker, no tobacco use, no alcohol or drug use Father Cardiac arrest,Mother MI,,Breast cancer ,  BP 129/88   Pulse 61   Temp 97.7 F (36.5 C) (Oral)   Resp  18   Ht 5' 6" (1.676 m)   Wt 201 lb (91.2 kg)   SpO2 100%   BMI 32.44 kg/m    Georgena Spurling, RN 04/09/2017,10:55 AM

## 2017-04-10 ENCOUNTER — Ambulatory Visit: Payer: Commercial Managed Care - PPO | Attending: General Surgery | Admitting: Physical Therapy

## 2017-04-10 DIAGNOSIS — R293 Abnormal posture: Secondary | ICD-10-CM

## 2017-04-10 DIAGNOSIS — M25612 Stiffness of left shoulder, not elsewhere classified: Secondary | ICD-10-CM | POA: Insufficient documentation

## 2017-04-10 DIAGNOSIS — M25512 Pain in left shoulder: Secondary | ICD-10-CM | POA: Insufficient documentation

## 2017-04-10 DIAGNOSIS — M6281 Muscle weakness (generalized): Secondary | ICD-10-CM | POA: Insufficient documentation

## 2017-04-10 DIAGNOSIS — G8929 Other chronic pain: Secondary | ICD-10-CM | POA: Insufficient documentation

## 2017-04-10 DIAGNOSIS — Z483 Aftercare following surgery for neoplasm: Secondary | ICD-10-CM | POA: Insufficient documentation

## 2017-04-10 DIAGNOSIS — Z170 Estrogen receptor positive status [ER+]: Secondary | ICD-10-CM | POA: Diagnosis present

## 2017-04-10 DIAGNOSIS — C50412 Malignant neoplasm of upper-outer quadrant of left female breast: Secondary | ICD-10-CM | POA: Insufficient documentation

## 2017-04-10 NOTE — Therapy (Signed)
[place], [place], [address] Phone: [phone]   Fax:  [phone]  Physical Therapy Evaluation  Patient Details  Name: [name] MRN: [id number] Date of Birth: 1956/09/08 Referring Provider: Dr. Donne Hazel   Encounter Date: 04/10/2017      PT End of Session - 04/10/17 1007    Visit Number 1   Number of Visits 9  likely will not need all sessions    Date for PT Re-Evaluation 05/10/17   PT Start Time 0802   PT Stop Time 0847   PT Time Calculation (min) 45 min   Activity Tolerance Patient tolerated treatment well   Behavior During Therapy [facility] for tasks assessed/performed      Past Medical History:  Diagnosis Date  . Arthritis   . Breast cancer ([place]) 01/15/2017   left breast  . Cancer ([place])   . GERD (gastroesophageal reflux disease)   . HLD (hyperlipidemia)   . Hypertension   . Hypothyroidism   . Obesity   . Overactive bladder   . Palpitation   . Thyroid disease     Past Surgical History:  Procedure Laterality Date  . bladder stem stretch  1966  . BREAST LUMPECTOMY WITH RADIOACTIVE SEED AND SENTINEL LYMPH NODE BIOPSY Left 02/16/2017   Procedure: BREAST LUMPECTOMY WITH RADIOACTIVE SEED AND AXILLARY SENTINEL LYMPH NODE BIOPSY;  Surgeon: Rolm Bookbinder, MD;  Location: [facility];  Service: General;  Laterality: Left;  . EVACUATION BREAST HEMATOMA Left 03/18/2017   Procedure: EVACUATION SEROMA LEFT BREAST;  Surgeon: Rolm Bookbinder, MD;  Location: [facility];  Service: General;  Laterality: Left;  . JOINT REPLACEMENT    . KNEE ARTHROSCOPY     LEFT  . NOSE SURGERY  1980  . SHOULDER SURGERY Left 2007  . TONSILLECTOMY  1964  . TOTAL HIP ARTHROPLASTY Left 10/23/2015   Procedure: TOTAL HIP ARTHROPLASTY ANTERIOR APPROACH;  Surgeon: Melrose Nakayama, MD;  Location: [facility];  Service: Orthopedics;  Laterality: Left;    There were no vitals filed for this  visit.       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 It measures 1.2 cm located in the left upper outer quadrant. It is ER/PR positive and HER2 negative with a Ki67 of 5%. She also has a history of a left rotator cuff repair from 2007. She fell on her left arm 03/2016 reinjuring it but has not gotten treatment to address that.  History also includes THR on the left.  Left lumpectomy with  nodes removed on Feb 26,  She developed a post op seroma that was drained on March 19 and had surgery on March 28 and a drain was inserted and that stayed  in about 10 days, Any drainage has stopped She has not had nor will need chemotherapy.  She is preparing for radiation..    How long can you stand comfortably? bout    Patient Stated Goals get arms stronger and prevent lymphedema             [facility] PT Assessment - 04/10/17 0001      Assessment  Medical Diagnosis Left breast cancer   Referring Provider Dr. Donne Hazel    Onset Date/Surgical Date 01/05/17   Hand Dominance Right   Prior Therapy none     Precautions   Precautions Other (comment)   Precaution Comments seroma after surgery with drainage and surgery for drain placement      Restrictions   Weight Bearing Restrictions No     Balance Screen   Has the patient fallen in the past 6 months No   Has the patient had a decrease in activity level because of a fear of falling?  No   Is the patient reluctant to leave their home because of a fear of falling?  No     Home Environment   Living Environment Private residence   Living Arrangements Children  60 y.o. 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, lifting, and carrying of heavy files and boxes    Leisure She does not exercise, busy with daughter  activities     Cognition   Overall Cognitive Status Within Functional Limits for tasks assessed     Observation/Other Assessments   Observations pt with healing closed incision  with small area of redundancy along incision line in left upper chest/axilla area with scabbed drain sites  minimal fullness in left breast Pt has been wearing a sports bra for compression   Quick DASH  18.18     Sensation   Light Touch Not tested     Coordination   Gross Motor Movements are Fluid and Coordinated Yes  no scapular dyskinesia      Posture/Postural Control   Posture/Postural Control Postural limitations   Postural Limitations Rounded Shoulders;Forward head     ROM / Strength   AROM / PROM / Strength AROM;Strength     AROM   Right Shoulder Extension --   Right Shoulder Flexion --   Right Shoulder ABduction --   Right Shoulder Internal Rotation --   Right Shoulder External Rotation --   Left Shoulder Extension --   Left Shoulder Flexion 160 Degrees  no pain    Left Shoulder ABduction 160 Degrees   Left Shoulder Internal Rotation 62 Degrees  measured in supine with scapula stabalized    Left Shoulder External Rotation 65 Degrees   Cervical Flexion --   Cervical Extension --   Cervical - Right Side Bend --   Cervical - Left Side Bend --   Cervical - Right Rotation --   Cervical - Left Rotation --     Strength   Overall Strength Deficits;Due to pain   Right/Left Shoulder Right;Left   Right Shoulder Flexion 4/5   Right Shoulder ABduction 4/5   Right Shoulder Internal Rotation 4/5   Right Shoulder External Rotation 4/5   Left Shoulder Flexion 3-/5   Left Shoulder ABduction 3-/5   Left Shoulder Internal Rotation 3-/5  limited by pain     Left Shoulder External Rotation 3-/5     Palpation   Palpation comment mild fullness around left axillay incision            LYMPHEDEMA/ONCOLOGY QUESTIONNAIRE - 04/10/17 0841      Type   Cancer Type Left breast cancer     Left Upper Extremity Lymphedema   10 cm Proximal to Olecranon Process 31.5 cm   Olecranon Process 28 cm  10 cm Proximal to Ulnar Styloid Process 24.4 cm   Just Proximal to Ulnar Styloid Process 17.2 cm   Across Hand at [employer] 19.8 cm   At [place] of 2nd Digit 6.4 cm           Quick Dash - 04/10/17 0001    Open a tight or new jar Moderate difficulty   Do heavy household chores (wash walls, wash floors) Mild difficulty   Carry a shopping bag or briefcase Mild difficulty   Wash your back No difficulty   Use a knife to cut food No difficulty   Recreational activities in which you take some force or impact through your arm, shoulder, or hand (golf, hammering, tennis) Moderate difficulty   During the past week, to what extent has your arm, shoulder or hand problem interfered with your normal social activities with family, friends, neighbors, or groups? Slightly   During the past week, to what extent has your arm, shoulder or hand problem limited your work or other regular daily activities Slightly   Arm, shoulder, or hand pain. None   Tingling (pins and needles) in your arm, shoulder, or hand None   Difficulty Sleeping No difficulty   DASH Score 18.18 %             [facility] Adult PT Treatment/Exercise - 04/10/17 0001      Self-Care   Self-Care Other Self-Care Comments   Other Self-Care Comments  instructed in stretch with left  arm in abducition and elbow suppored with heard turned to right to simulate radiation position                 PT Education - 04/10/17 1006    Education provided Yes   Education Details left arm stretch for radiation simulation, flyers on ABC Class, [employer] Program, exercises classes at cancer center for Tai  chi... do not recommend Yoga due to previous THR    Person(s) Educated Patient   Methods Explanation;Handout;Demonstration   Comprehension Verbalized understanding;Returned demonstration                [facility] Goals - 04/10/17 1018      CC Long Term Goal  #1   Title Patient with verbalize an understanding of lymphedema risk reduction precautions   Time 4   Period Weeks   Status New     CC Long Term Goal  #2   Title Patient will be independent in home exercise program for strength and range of motion    Time 4   Period Weeks   Status New     CC Long Term Goal  #3   Title Patient will be independent in self manual lymph drainage   Time 4   Period Weeks     CC Long Term Goal  #4   Title Patient will decrease the DASH score to <10   to demonstrate increased functional use of upper extremity   Baseline 18.18   Time 4   Period Weeks   Status New            Plan - 04/10/17 1008    Clinical Impression Statement Pt with left breast lumpectomy with 4 nodes removed complicated with seroma that required aspiration and drains. She has past history of left rotator cuff repain and left THR. Her job requires pulling and lifting with upper body and she is at risk for lymphedema due to 4 nodes removed and seroma complication. she will be receiving  radiation so her condition is evolving.  For these reasons, this is a moderately complex evaluation    Rehab Potential Excellent   Clinical Impairments Affecting Rehab Potential 4 nodes removed, post op seroma.    PT Frequency 2x / week   PT Duration 4 weeks   PT Treatment/Interventions ADLs/Self Care Home Management;Patient/family education;DME Instruction;Taping;Therapeutic activities;Manual lymph drainage;Therapeutic exercise;Passive range of motion   PT Next Visit Plan Begin low intensity shoulder and upper body strengthening with supine scapular series and progress to Strength ABC program.  Teach lymphedema risk reduction,   teach self MLD for left breast and use of compression bras since pt has had breast swellling in the past.    Consulted and Agree with Plan of Care Patient      Patient will benefit from skilled therapeutic intervention in order to improve the following deficits and impairments:  Postural dysfunction, Decreased knowledge of precautions, Pain, Impaired UE functional use, Increased edema, Decreased strength, Decreased knowledge of use of DME  Visit Diagnosis: Abnormal posture  Aftercare following surgery for neoplasm  Muscle weakness (generalized)     Problem List Patient Active Problem List   Diagnosis Date Noted  . Malignant neoplasm of upper-outer quadrant of left breast in female, estrogen receptor positive ([place]) 01/21/2017  . Achilles tendinitis of left lower extremity 09/08/2016  . Primary osteoarthritis of left hip 10/23/2015  . Arthritis of left hip 06/19/2015  . Hip flexor tendinitis 05/29/2015  . Essential hypertension 05/11/2015  . Hypothyroidism 05/11/2015  . Routine general medical examination at a health care facility 05/11/2015  . Obesity 05/11/2015  . Palpitations 01/03/2014  . Chest pain 01/03/2014  . Hyperlipidemia 01/03/2014   Donato Heinz. Owens Shark PT  Norwood Levo 04/10/2017, 10:20 AM  [place], [place], [address] Phone: [phone]   Fax:  [phone]  Name: [name] MRN: [id number] Date of Birth: September 10, 1956

## 2017-04-13 ENCOUNTER — Ambulatory Visit
Admission: RE | Admit: 2017-04-13 | Discharge: 2017-04-13 | Disposition: A | Discharge status: Home / Self Care | Payer: Commercial Managed Care - PPO | Source: Ambulatory Visit | Attending: Radiation Oncology | Admitting: Radiation Oncology

## 2017-04-13 ENCOUNTER — Encounter: Payer: Self-pay | Admitting: Radiation Oncology

## 2017-04-13 VITALS — BP 129/88 | HR 61 | Temp 97.7°F | Resp 18 | Ht 66.0 in | Wt 201.0 lb

## 2017-04-13 DIAGNOSIS — C50412 Malignant neoplasm of upper-outer quadrant of left female breast: Secondary | ICD-10-CM

## 2017-04-13 DIAGNOSIS — Z170 Estrogen receptor positive status [ER+]: Secondary | ICD-10-CM | POA: Diagnosis not present

## 2017-04-13 DIAGNOSIS — Z510 Encounter for antineoplastic radiation therapy: Secondary | ICD-10-CM | POA: Diagnosis not present

## 2017-04-13 DIAGNOSIS — Z9889 Other specified postprocedural states: Secondary | ICD-10-CM | POA: Diagnosis not present

## 2017-04-13 NOTE — Addendum Note (Signed)
Encounter addended by: Malena Edman, RN on: 04/13/2017  3:58 PM<BR>    Actions taken: Charge Capture section accepted

## 2017-04-13 NOTE — Progress Notes (Signed)
  Radiation Oncology         [phone] ________________________________  Name: [name] MRN: [id number]  Date: 04/13/2017  DOB: 1956-03-31  DIAGNOSIS:     ICD-9-CM ICD-10-CM   1. Malignant neoplasm of upper-outer quadrant of left breast in female, estrogen receptor positive ([place]) 174.4 C50.412    V86.0 Z17.0      SIMULATION AND TREATMENT PLANNING NOTE  The patient presented for simulation prior to beginning her course of radiation treatment for her diagnosis of right-sided breast cancer. The patient was placed in a supine position on a breast board. A customized vac-lock bag was constructed and this complex treatment device will be used on a daily basis during her treatment. In this fashion, a CT scan was obtained through the chest area and an isocenter was placed near the chest wall within the breast.  The patient will be planned to receive a course of radiation initially to a dose of 50.4 Gy. This will consist of a whole breast radiotherapy technique. To accomplish this, 2 customized blocks have been designed which will correspond to medial and lateral whole breast tangent fields. This treatment will be accomplished at 1.8 Gy per fraction. A forward planning technique will also be evaluated to determine if this approach improves the plan. It is anticipated that the patient will then receive a 14 Gy boost to the seroma cavity which has been contoured. This will be accomplished at 2 Gy per fraction.   This initial treatment will consist of a 3-D conformal technique. The seroma has been contoured as the primary target structure. Additionally, dose volume histograms of both this target as well as the lungs and heart will also be evaluated. Such an approach is necessary to ensure that the target area is adequately covered while the nearby critical  normal structures are adequately spared.  Plan:  The final anticipated total dose therefore will correspond to 64.4  Gy.    _______________________________   Jodelle Gross, MD, PhD

## 2017-04-13 NOTE — Addendum Note (Signed)
Encounter addended by: Malena Edman, RN on: 04/13/2017  3:56 PM<BR>    Actions taken: Charge Capture section accepted

## 2017-04-13 NOTE — Progress Notes (Signed)
Radiation Oncology         [phone] ________________________________  Name: [name] MRN: [id number]  Date: 04/13/2017  DOB: August 18, 1956  HG:[id number] [name], MD  Rolm Bookbinder, MD     REFERRING PHYSICIAN: Rolm Bookbinder, MD   DIAGNOSIS: There were no encounter diagnoses.   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 female originially seen in the multidisciplinary breast clinic for a new diagnosis of left breast cancer. She was found to have a distortion on her screening mammography. She subsequently underwent diagnostic imaging and this revealed a 1.2 x .8 x 1 cm mass in the left breast. The axillary ultrasound was negative for cortical thickening. She underwent left breast biopsy on 01/15/17 which revealed focal grade 1 invasive lobular carcinoma and hyperplasia, ER/PR positive, HER2 not amplified, Ki-67% was <5%.  The patient underwent surgery on 02/16/17 by Dr. Donne Hazel. Left lumpectomy revealed grade 1 invasive lobular carcinoma measuring 2.1 cm and the tumor was focally less than 0.1 cm to the lateral margin but the inked marginal surface was negative for tumor. 4 biopsied left axillary sentinel lymph nodes were negative for metastatic disease. Oncotype DX score of 12; low risk with a 10-year risk of distant recurrence of 8% with Tamoxifen alone. Chemotherapy was not recommended.  She returns today for consideration of beginning radiotherapy planning. She was last seen on 03/05/17 and had evidence of a large seroma and hence treatment planning was postponed. She underwent evacuation of this on 03/18/17 after 600 cc was previously drawn off under image guidance but returned.   PREVIOUS RADIATION THERAPY: No   PAST MEDICAL HISTORY:  Past Medical History:  Diagnosis Date  . Arthritis   . Breast cancer ([place]) 01/15/2017   left breast  . Cancer ([place])   . GERD  (gastroesophageal reflux disease)   . HLD (hyperlipidemia)   . Hypertension   . Hypothyroidism   . Obesity   . Overactive bladder   . Palpitation   . Thyroid disease        PAST SURGICAL HISTORY: Past Surgical History:  Procedure Laterality Date  . bladder stem stretch  1966  . BREAST LUMPECTOMY WITH RADIOACTIVE SEED AND SENTINEL LYMPH NODE BIOPSY Left 02/16/2017   Procedure: BREAST LUMPECTOMY WITH RADIOACTIVE SEED AND AXILLARY SENTINEL LYMPH NODE BIOPSY;  Surgeon: Rolm Bookbinder, MD;  Location: [facility];  Service: General;  Laterality: Left;  . EVACUATION BREAST HEMATOMA Left 03/18/2017   Procedure: EVACUATION SEROMA LEFT BREAST;  Surgeon: Rolm Bookbinder, MD;  Location: [facility];  Service: General;  Laterality: Left;  . JOINT REPLACEMENT    . KNEE ARTHROSCOPY     LEFT  . NOSE SURGERY  1980  . SHOULDER SURGERY Left 2007  . TONSILLECTOMY  1964  . TOTAL HIP ARTHROPLASTY Left 10/23/2015   Procedure: TOTAL HIP ARTHROPLASTY ANTERIOR APPROACH;  Surgeon: Melrose Nakayama, MD;  Location: [facility];  Service: Orthopedics;  Laterality: Left;     FAMILY HISTORY:  Family History  Problem Relation Age of Onset  . Heart Problems Father     cardiac arrest  . Heart attack Mother   . Heart disease Mother   . Cancer Maternal Uncle 33    colon cancer   . Cancer Maternal Grandmother     bladder cancer      SOCIAL HISTORY:  reports that she has never smoked. She  has never used smokeless tobacco. She reports that she does not drink alcohol or use drugs. The patient is single and lives in [place]. She works for a [employer].    ALLERGIES: Patient has no known allergies.   MEDICATIONS:  Current Outpatient Prescriptions  Medication Sig Dispense Refill  . acetaminophen (TYLENOL) 325 MG tablet Take 650 mg by mouth every 6 (six) hours as needed for mild pain.    . clobetasol cream (TEMOVATE) 1.61 % Apply 1 application topically 2 (two) times daily as  needed (itching).     . hydrochlorothiazide (HYDRODIURIL) 25 MG tablet TAKE 1 TABLET BY MOUTH EVERY DAY 30 tablet 3  . levothyroxine (SYNTHROID, LEVOTHROID) 88 MCG tablet Take 88 mcg by mouth daily. Take 2 tabs for 2 days  Alternate with 44 mcg for 1 day, then so forth.  12  . metoprolol succinate (TOPROL-XL) 25 MG 24 hr tablet TAKE 1 TABLET BY MOUTH EVERY DAY (Patient taking differently: TAKE 1/2 TABLET BY MOUTH EVERY DAY) 90 tablet 3  . pantoprazole (PROTONIX) 40 MG tablet Take 1 tablet (40 mg total) by mouth daily. Pt. needs to set up follow up appt. 30 tablet 0  . simvastatin (ZOCOR) 20 MG tablet Take 20 mg by mouth daily.    Marland Kitchen HYDROcodone-acetaminophen (NORCO) 10-325 MG tablet Take 1 tablet by mouth every 6 (six) hours as needed. (Patient not taking: Reported on 04/13/2017) 10 tablet 0  . oxyCODONE-acetaminophen (PERCOCET) 10-325 MG tablet Take 1 tablet by mouth every 6 (six) hours as needed for pain. (Patient not taking: Reported on 04/10/2017) 12 tablet 0   No current facility-administered medications for this encounter.      REVIEW OF SYSTEMS: On review of systems, the patient reports that she is doing well overall. She reports that since having re-excision of her seroma pocket, she's dong well. Her JP drain was removed last week Monday. She denies any chest pain, shortness of breath, cough, fevers, chills, night sweats, unintended weight changes. She denies any bowel or bladder disturbances, and denies abdominal pain, nausea or vomiting. She denies any new musculoskeletal or joint aches or pains, new skin lesions or concerns. A complete review of systems is obtained and is otherwise negative.     PHYSICAL EXAM:  Wt Readings from Last 3 Encounters:  04/13/17 201 lb (91.2 kg)  03/18/17 200 lb (90.7 kg)  03/05/17 200 lb 6.4 oz (90.9 kg)   Temp Readings from Last 3 Encounters:  04/13/17 97.7 F (36.5 C) (Oral)  03/18/17 97.7 F (36.5 C)  03/05/17 98.2 F (36.8 C) (Oral)   BP  Readings from Last 3 Encounters:  04/13/17 129/88  03/18/17 120/73  03/05/17 129/77   Pulse Readings from Last 3 Encounters:  04/13/17 61  03/18/17 64  03/05/17 80   Pain Assessment Pain Score: 0-No pain/10  In general this is a well appearing caucasian female in no acute distress. She's alert and oriented x4 and appropriate throughout the examination. Cardiopulmonary assessment is negative for acute distress and she exhibits normal effort. Her left breast is examined and well healed lumpectomy site is seen with minimal induration deep to the site without fluctuance, cellulitic change or palpable warmth of the skin. No chest wall edema is noted.   ECOG = 0  0 - Asymptomatic (Fully active, able to carry on all predisease activities without restriction)  1 - Symptomatic but completely ambulatory (Restricted in physically strenuous activity but ambulatory and able to carry out work of a light  or sedentary nature. For example, light housework, office work)  2 - Symptomatic, <50% in bed during the day (Ambulatory and capable of all self care but unable to carry out any work activities. Up and about more than 50% of waking hours)  3 - Symptomatic, >50% in bed, but not bedbound (Capable of only limited self-care, confined to bed or chair 50% or more of waking hours)  4 - Bedbound (Completely disabled. Cannot carry on any self-care. Totally confined to bed or chair)  5 - Death   Eustace Pen MM, Creech RH, Tormey DC, et al. [phone]). "Toxicity and response criteria of the [facility]". [place] Oncol. 5 (6): 649-55    LABORATORY DATA:  Lab Results  Component Value Date   WBC 6.9 01/28/2017   HGB 13.7 01/28/2017   HCT 42.3 01/28/2017   MCV 87.9 01/28/2017   PLT 342 01/28/2017   Lab Results  Component Value Date   NA 136 02/11/2017   K 3.4 (L) 02/11/2017   CL 97 (L) 02/11/2017   CO2 26 02/11/2017   Lab Results  Component Value Date   ALT 17 01/28/2017   AST  17 01/28/2017   ALKPHOS 117 01/28/2017   BILITOT 0.68 01/28/2017      RADIOGRAPHY: No results found.     IMPRESSION/PLAN: 1. 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 Dr. Lisbeth Renshaw discusses the pathology findings and reviews the nature of invasive breast disease. The patient has healed well and is now ready to begin adjuvant external radiotherapy to the breast followed by antiestrogen therapy. We discussed the risks, benefits, short, and long term effects of radiotherapy, and the patient is interested in proceeding. Dr. Lisbeth Renshaw discusses the delivery and logistics of radiotherapy and recommends a course of 6 1/2 weeks of radiotherapy. He reviews written consent and a signed copy is given to the patient and the original placed in the chart. She will simulate today.  In a visit lasting 25 minutes, greater than 50% of the time was spent face to face discussing her postoperative course, and coordinating the patient's care.   The above documentation reflects my direct findings during this shared patient visit. Please see the separate note by Dr. Lisbeth Renshaw on this date for the remainder of the patient's plan of care.     Carola Rhine, PAC

## 2017-04-15 ENCOUNTER — Ambulatory Visit: Payer: Commercial Managed Care - PPO

## 2017-04-15 DIAGNOSIS — Z483 Aftercare following surgery for neoplasm: Secondary | ICD-10-CM | POA: Diagnosis not present

## 2017-04-15 DIAGNOSIS — R293 Abnormal posture: Secondary | ICD-10-CM

## 2017-04-15 DIAGNOSIS — G8929 Other chronic pain: Secondary | ICD-10-CM

## 2017-04-15 DIAGNOSIS — Z170 Estrogen receptor positive status [ER+]: Secondary | ICD-10-CM

## 2017-04-15 DIAGNOSIS — M6281 Muscle weakness (generalized): Secondary | ICD-10-CM

## 2017-04-15 DIAGNOSIS — C50412 Malignant neoplasm of upper-outer quadrant of left female breast: Secondary | ICD-10-CM | POA: Diagnosis not present

## 2017-04-15 DIAGNOSIS — M25512 Pain in left shoulder: Secondary | ICD-10-CM

## 2017-04-15 DIAGNOSIS — M25612 Stiffness of left shoulder, not elsewhere classified: Secondary | ICD-10-CM

## 2017-04-15 DIAGNOSIS — Z510 Encounter for antineoplastic radiation therapy: Secondary | ICD-10-CM | POA: Diagnosis not present

## 2017-04-15 NOTE — Patient Instructions (Signed)

## 2017-04-15 NOTE — Therapy (Signed)
[place], [place], [address] Phone: [phone]   Fax:  [phone]  Physical Therapy Treatment  Patient Details  Name: [name] MRN: [id number] Date of Birth: Jul 14, 1956 Referring Provider: Dr. Donne Hazel   Encounter Date: 04/15/2017      PT End of Session - 04/15/17 1657    Visit Number 2   Number of Visits 9   Date for PT Re-Evaluation 05/10/17   PT Start Time 1609   PT Stop Time 1651   PT Time Calculation (min) 42 min   Activity Tolerance Patient tolerated treatment well   Behavior During Therapy [facility] for tasks assessed/performed      Past Medical History:  Diagnosis Date  . Arthritis   . Breast cancer ([place]) 01/15/2017   left breast  . Cancer ([place])   . GERD (gastroesophageal reflux disease)   . HLD (hyperlipidemia)   . Hypertension   . Hypothyroidism   . Obesity   . Overactive bladder   . Palpitation   . Thyroid disease     Past Surgical History:  Procedure Laterality Date  . bladder stem stretch  1966  . BREAST LUMPECTOMY WITH RADIOACTIVE SEED AND SENTINEL LYMPH NODE BIOPSY Left 02/16/2017   Procedure: BREAST LUMPECTOMY WITH RADIOACTIVE SEED AND AXILLARY SENTINEL LYMPH NODE BIOPSY;  Surgeon: Rolm Bookbinder, MD;  Location: [facility];  Service: General;  Laterality: Left;  . EVACUATION BREAST HEMATOMA Left 03/18/2017   Procedure: EVACUATION SEROMA LEFT BREAST;  Surgeon: Rolm Bookbinder, MD;  Location: [facility];  Service: General;  Laterality: Left;  . JOINT REPLACEMENT    . KNEE ARTHROSCOPY     LEFT  . NOSE SURGERY  1980  . SHOULDER SURGERY Left 2007  . TONSILLECTOMY  1964  . TOTAL HIP ARTHROPLASTY Left 10/23/2015   Procedure: TOTAL HIP ARTHROPLASTY ANTERIOR APPROACH;  Surgeon: Melrose Nakayama, MD;  Location: [facility];  Service: Orthopedics;  Laterality: Left;    There were no vitals filed for this visit.      Subjective Assessment -  04/15/17 1611    Subjective Doing good, nothing new since eval last week. I was able to do my radiation simulation with no problem, I was pleasantly surprised I was able to get my arm into position.   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 It measures 1.2 cm located in the left upper outer quadrant. It is ER/PR positive and HER2 negative with a Ki67 of 5%. She also has a history of a left rotator cuff repair from 2007. She fell on her left arm 03/2016 reinjuring it but has not gotten treatment to address that.  History also includes THR on the left.  Left lumpectomy with  nodes removed on Feb 26,  She developed a post op seroma that was drained on March 19 and had surgery on March 28 and a drain was inserted and that stayed  in about 10 days, Any drainage has stopped She has not had nor will need chemotherapy.  She is preparing for radiation..    Patient Stated Goals get arms stronger and prevent lymphedema    Currently in Pain? No/denies                         [facility] Adult PT Treatment/Exercise - 04/15/17 0001      Shoulder Exercises: Supine   Horizontal ABduction Strengthening;Both;10 reps;Theraband  Theraband Level (Shoulder Horizontal ABduction) Level 1 (Yellow)   External Rotation Strengthening;Both;10 reps;Theraband   Theraband Level (Shoulder External Rotation) Level 1 (Yellow)   Flexion Strengthening;Both;10 reps;Theraband  Narrow (10 times) and Wide Grip (5 times)   Other Supine Exercises Bil D2 with yellow theraband 5 times each     Shoulder Exercises: Pulleys   Flexion 2 minutes   ABduction 2 minutes     Shoulder Exercises: Therapy Ball   Flexion 10 reps  With forward lean into end of stretch   ABduction 5 reps  Lt UE with side lean into end of stretch     Manual Therapy   Manual Therapy Myofascial release;Passive ROM   Passive ROM To Lt shoulder into flexion, abduction, and er to pts  tolerance                PT Education - 04/15/17 1625    Education provided Yes   Education Details Supine scapular series with yellow theraband   Person(s) Educated Patient   Methods Explanation;Demonstration;Handout   Comprehension Verbalized understanding;Returned demonstration              Breast Clinic Goals - 01/28/17 1337      Patient will be able to verbalize understanding of pertinent lymphedema risk reduction practices relevant to her diagnosis specifically related to skin care.   Time 1   Period Days   Status Achieved     Patient will be able to return demonstrate and/or verbalize understanding of the post-op home exercise program related to regaining shoulder range of motion.   Time 1   Period Days   Status Achieved     Patient will be able to verbalize understanding of the importance of attending the postoperative After Breast Cancer Class for further lymphedema risk reduction education and therapeutic exercise.   Time 1   Period Days   Status Achieved          Long Term Clinic Goals - 04/10/17 1018      CC Long Term Goal  #1   Title Patient with verbalize an understanding of lymphedema risk reduction precautions   Time 4   Period Weeks   Status New     CC Long Term Goal  #2   Title Patient will be independent in home exercise program for strength and range of motion    Time 4   Period Weeks   Status New     CC Long Term Goal  #3   Title Patient will be independent in self manual lymph drainage   Time 4   Period Weeks     CC Long Term Goal  #4   Title Patient will decrease the DASH score to <10   to demonstrate increased functional use of upper extremity   Baseline 18.18   Time 4   Period Weeks   Status New            Plan - 04/15/17 1658    Clinical Impression Statement Pt did very well with first session of stretching and gentle strengthening. She reported no pain with all activities except minimal pain at shoulder with  end ROM P/ROM abduction and with supine D2 with band so instructed pt not to push into pain. She was able to return correct demonstration of this. Also instructed her if she feels she is having any pain or other sensations in her Lt breast to stop strengthening exercises and and decrease stretching to avoid risk of seroma. Pt verbalized  understanding this.  Pt reports has a compression bra and has been wearing this.    Rehab Potential Excellent   Clinical Impairments Affecting Rehab Potential 4 nodes removed, post op seroma.    PT Frequency 2x / week   PT Duration 4 weeks   PT Treatment/Interventions ADLs/Self Care Home Management;Patient/family education;DME Instruction;Taping;Therapeutic activities;Manual lymph drainage;Therapeutic exercise;Passive range of motion   PT Next Visit Plan Cont low intensity shoulder and upper body strengthening reviewing supine scapular series and eventually progress to Strength ABC program.  Teach lymphedema risk reduction,  teach self MLD for left breast.   Consulted and Agree with Plan of Care Patient      Patient will benefit from skilled therapeutic intervention in order to improve the following deficits and impairments:  Postural dysfunction, Decreased knowledge of precautions, Pain, Impaired UE functional use, Increased edema, Decreased strength, Decreased knowledge of use of DME  Visit Diagnosis: Abnormal posture  Aftercare following surgery for neoplasm  Muscle weakness (generalized)  Carcinoma of upper-outer quadrant of left breast in female, estrogen receptor positive (HCC)  Chronic left shoulder pain  Stiffness of left shoulder, not elsewhere classified     Problem List Patient Active Problem List   Diagnosis Date Noted  . Malignant neoplasm of upper-outer quadrant of left breast in female, estrogen receptor positive ([place]) 01/21/2017  . Achilles tendinitis of left lower extremity 09/08/2016  . Primary osteoarthritis of left hip 10/23/2015   . Arthritis of left hip 06/19/2015  . Hip flexor tendinitis 05/29/2015  . Essential hypertension 05/11/2015  . Hypothyroidism 05/11/2015  . Routine general medical examination at a health care facility 05/11/2015  . Obesity 05/11/2015  . Palpitations 01/03/2014  . Chest pain 01/03/2014  . Hyperlipidemia 01/03/2014    Otelia Limes, PTA 04/15/2017, 5:03 PM  [place], [place], [address] Phone: [phone]   Fax:  [phone]  Name: [name] MRN: [id number] Date of Birth: [birth year]

## 2017-04-17 ENCOUNTER — Ambulatory Visit: Payer: Commercial Managed Care - PPO | Admitting: Physical Therapy

## 2017-04-17 DIAGNOSIS — Z483 Aftercare following surgery for neoplasm: Secondary | ICD-10-CM

## 2017-04-17 DIAGNOSIS — M6281 Muscle weakness (generalized): Secondary | ICD-10-CM

## 2017-04-17 DIAGNOSIS — R293 Abnormal posture: Secondary | ICD-10-CM | POA: Diagnosis not present

## 2017-04-17 NOTE — Therapy (Addendum)
[place], [place], [address] Phone: [phone]   Fax:  [phone]  Physical Therapy Treatment  Patient Details  Name: [name] MRN: [id number] Date of Birth: [birth year] Referring Provider: Dr. Donne Hazel   Encounter Date: 04/17/2017      PT End of Session - 04/17/17 1209    Visit Number 3   Number of Visits 9   Date for PT Re-Evaluation 05/10/17   PT Start Time 0848   PT Stop Time 0934   PT Time Calculation (min) 46 min   Activity Tolerance Patient tolerated treatment well   Behavior During Therapy [facility] for tasks assessed/performed      Past Medical History:  Diagnosis Date  . Arthritis   . Breast cancer ([place]) 01/15/2017   left breast  . Cancer ([place])   . GERD (gastroesophageal reflux disease)   . HLD (hyperlipidemia)   . Hypertension   . Hypothyroidism   . Obesity   . Overactive bladder   . Palpitation   . Thyroid disease     Past Surgical History:  Procedure Laterality Date  . bladder stem stretch  1966  . BREAST LUMPECTOMY WITH RADIOACTIVE SEED AND SENTINEL LYMPH NODE BIOPSY Left 02/16/2017   Procedure: BREAST LUMPECTOMY WITH RADIOACTIVE SEED AND AXILLARY SENTINEL LYMPH NODE BIOPSY;  Surgeon: Rolm Bookbinder, MD;  Location: [facility];  Service: General;  Laterality: Left;  . EVACUATION BREAST HEMATOMA Left 03/18/2017   Procedure: EVACUATION SEROMA LEFT BREAST;  Surgeon: Rolm Bookbinder, MD;  Location: [facility];  Service: General;  Laterality: Left;  . JOINT REPLACEMENT    . KNEE ARTHROSCOPY     LEFT  . NOSE SURGERY  1980  . SHOULDER SURGERY Left 2007  . TONSILLECTOMY  1964  . TOTAL HIP ARTHROPLASTY Left 10/23/2015   Procedure: TOTAL HIP ARTHROPLASTY ANTERIOR APPROACH;  Surgeon: Melrose Nakayama, MD;  Location: [facility];  Service: Orthopedics;  Laterality: Left;    There were no vitals filed for this visit.      Subjective Assessment -  04/17/17 0850    Subjective "Things are going fine." Starts radiation on Tuesday for 35 treatments.  Does okay with getting left arm overhead, but it can start to cramp when she tries to keep it there for a while.   Currently in Pain? No/denies                         [facility] Adult PT Treatment/Exercise - 04/17/17 0001      Self-Care   Other Self-Care Comments  instructed patient in lymphedema risk reduction     Shoulder Exercises: Supine   Horizontal ABduction Strengthening;Both;5 reps;Theraband   Theraband Level (Shoulder Horizontal ABduction) Level 1 (Yellow)   External Rotation Strengthening;Both;5 reps;Theraband   Theraband Level (Shoulder External Rotation) Level 1 (Yellow)   Flexion Strengthening;Both;5 reps;Theraband  wide and narrow grip   Theraband Level (Shoulder Flexion) Level 1 (Yellow)   Other Supine Exercises Bil D2 with yellow theraband 5 times each     Manual Therapy   Manual Therapy Myofascial release   Myofascial Release left UE myofascial pulling with movement into abduction to patient tolerance   Passive ROM To Lt shoulder into flexion, abduction, and er to pts tolerance; also into radiation positioning                PT Education - 04/17/17 1208    Education provided Yes   Education  Details lymphedema risk reduction practices; suggested a compression sleeve for vigorous activity and airplane flight   Person(s) Educated Patient   Methods Explanation;Handout   Comprehension Verbalized understanding              Breast Clinic Goals - 01/28/17 1337      Patient will be able to verbalize understanding of pertinent lymphedema risk reduction practices relevant to her diagnosis specifically related to skin care.   Time 1   Period Days   Status Achieved     Patient will be able to return demonstrate and/or verbalize understanding of the post-op home exercise program related to regaining shoulder range of motion.   Time 1   Period  Days   Status Achieved     Patient will be able to verbalize understanding of the importance of attending the postoperative After Breast Cancer Class for further lymphedema risk reduction education and therapeutic exercise.   Time 1   Period Days   Status Achieved          Long Term Clinic Goals - 04/17/17 1211      CC Long Term Goal  #1   Title Patient with verbalize an understanding of lymphedema risk reduction precautions   Status Achieved     CC Long Term Goal  #2   Title Patient will be independent in home exercise program for strength and range of motion    Status Partially Met     CC Long Term Goal  #3   Title Patient will be independent in self manual lymph drainage   Status On-going     CC Long Term Goal  #4   Title Patient will decrease the DASH score to <10   to demonstrate increased functional use of upper extremity   Status On-going            Plan - 04/17/17 1209    Clinical Impression Statement Pt. did well with review of supine scapular series, with manual techniques, and learning lymphedema risk reduction.  Good progress.   Rehab Potential Excellent   Clinical Impairments Affecting Rehab Potential 4 nodes removed, post op seroma.    PT Frequency 2x / week   PT Duration 4 weeks   PT Treatment/Interventions ADLs/Self Care Home Management;Patient/family education;DME Instruction;Taping;Therapeutic activities;Manual lymph drainage;Therapeutic exercise;Passive range of motion   PT Next Visit Plan Talk more about obtaining a compression sleeve and bra. Cont low intensity shoulder and upper body strengthening--could start Strength ABC program next.  Teach self MLD for left breast.   PT Home Exercise Plan Post op shoulder ROM HEP   Consulted and Agree with Plan of Care Patient      Patient will benefit from skilled therapeutic intervention in order to improve the following deficits and impairments:  Postural dysfunction, Decreased knowledge of precautions,  Pain, Impaired UE functional use, Increased edema, Decreased strength, Decreased knowledge of use of DME  Visit Diagnosis: Abnormal posture  Aftercare following surgery for neoplasm  Muscle weakness (generalized)     Problem List Patient Active Problem List   Diagnosis Date Noted  . Malignant neoplasm of upper-outer quadrant of left breast in female, estrogen receptor positive ([place]) 01/21/2017  . Achilles tendinitis of left lower extremity 09/08/2016  . Primary osteoarthritis of left hip 10/23/2015  . Arthritis of left hip 06/19/2015  . Hip flexor tendinitis 05/29/2015  . Essential hypertension 05/11/2015  . Hypothyroidism 05/11/2015  . Routine general medical examination at a health care facility 05/11/2015  . Obesity 05/11/2015  .  Palpitations 01/03/2014  . Chest pain 01/03/2014  . Hyperlipidemia 01/03/2014    Keavon Sensing 04/17/2017, 12:13 PM  [place], [place], [address] Phone: [phone]   Fax:  [phone]  Name: BETTEJANE LEAVENS MRN: [id number] Date of Birth: Jan 09, 1956  Serafina Royals, PT 04/17/17 12:13 PM

## 2017-04-20 ENCOUNTER — Telehealth: Payer: Self-pay | Admitting: Hematology

## 2017-04-20 ENCOUNTER — Ambulatory Visit
Admission: RE | Admit: 2017-04-20 | Discharge: 2017-04-20 | Disposition: A | Discharge status: Home / Self Care | Payer: Commercial Managed Care - PPO | Source: Ambulatory Visit | Attending: Radiation Oncology | Admitting: Radiation Oncology

## 2017-04-20 DIAGNOSIS — Z510 Encounter for antineoplastic radiation therapy: Secondary | ICD-10-CM | POA: Diagnosis not present

## 2017-04-20 DIAGNOSIS — Z170 Estrogen receptor positive status [ER+]: Secondary | ICD-10-CM | POA: Diagnosis not present

## 2017-04-20 DIAGNOSIS — C50412 Malignant neoplasm of upper-outer quadrant of left female breast: Secondary | ICD-10-CM | POA: Diagnosis not present

## 2017-04-20 NOTE — Telephone Encounter (Signed)
sw pt re June 18 appt at 315 pm per LOS. Unable to sch for 6/19 due to MD out of office

## 2017-04-21 ENCOUNTER — Ambulatory Visit
Admission: RE | Admit: 2017-04-21 | Discharge: 2017-04-21 | Disposition: A | Discharge status: Home / Self Care | Payer: Commercial Managed Care - PPO | Source: Ambulatory Visit | Attending: Radiation Oncology | Admitting: Radiation Oncology

## 2017-04-21 DIAGNOSIS — Z510 Encounter for antineoplastic radiation therapy: Secondary | ICD-10-CM | POA: Diagnosis not present

## 2017-04-21 DIAGNOSIS — C50412 Malignant neoplasm of upper-outer quadrant of left female breast: Secondary | ICD-10-CM | POA: Diagnosis not present

## 2017-04-21 DIAGNOSIS — Z170 Estrogen receptor positive status [ER+]: Secondary | ICD-10-CM | POA: Diagnosis not present

## 2017-04-21 MED ORDER — ALRA NON-METALLIC DEODORANT (RAD-ONC)
1.0000 "application " | Freq: Once | TOPICAL | Status: AC
  Administered 2017-04-21: 1 via TOPICAL

## 2017-04-21 MED ORDER — RADIAPLEXRX EX GEL
Freq: Once | CUTANEOUS | Status: AC
  Administered 2017-04-21: 12:00:00 via TOPICAL

## 2017-04-21 NOTE — Progress Notes (Signed)
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

## 2017-04-22 ENCOUNTER — Ambulatory Visit
Admission: RE | Admit: 2017-04-22 | Discharge: 2017-04-22 | Disposition: A | Discharge status: Home / Self Care | Payer: Commercial Managed Care - PPO | Source: Ambulatory Visit | Attending: Radiation Oncology | Admitting: Radiation Oncology

## 2017-04-22 ENCOUNTER — Ambulatory Visit: Payer: Commercial Managed Care - PPO | Attending: General Surgery

## 2017-04-22 ENCOUNTER — Encounter: Payer: Self-pay | Admitting: Radiation Oncology

## 2017-04-22 DIAGNOSIS — Z170 Estrogen receptor positive status [ER+]: Secondary | ICD-10-CM | POA: Diagnosis present

## 2017-04-22 DIAGNOSIS — M6281 Muscle weakness (generalized): Secondary | ICD-10-CM | POA: Diagnosis present

## 2017-04-22 DIAGNOSIS — Z510 Encounter for antineoplastic radiation therapy: Secondary | ICD-10-CM | POA: Diagnosis not present

## 2017-04-22 DIAGNOSIS — M25612 Stiffness of left shoulder, not elsewhere classified: Secondary | ICD-10-CM

## 2017-04-22 DIAGNOSIS — Z483 Aftercare following surgery for neoplasm: Secondary | ICD-10-CM | POA: Diagnosis present

## 2017-04-22 DIAGNOSIS — G8929 Other chronic pain: Secondary | ICD-10-CM | POA: Diagnosis present

## 2017-04-22 DIAGNOSIS — C50412 Malignant neoplasm of upper-outer quadrant of left female breast: Secondary | ICD-10-CM | POA: Diagnosis present

## 2017-04-22 DIAGNOSIS — R293 Abnormal posture: Secondary | ICD-10-CM | POA: Diagnosis present

## 2017-04-22 DIAGNOSIS — M25512 Pain in left shoulder: Secondary | ICD-10-CM | POA: Diagnosis present

## 2017-04-22 NOTE — Progress Notes (Signed)
Paperwork (unum) received 5/2, given to nurse

## 2017-04-22 NOTE — Therapy (Signed)
[place], [place], [address] Phone: [phone]   Fax:  [phone]  Physical Therapy Treatment  Patient Details  Name: [name] MRN: [id number] Date of Birth: December 15, 1956 Referring Provider: Dr. Donne Hazel   Encounter Date: 04/22/2017      PT End of Session - 04/22/17 1406    Visit Number 4   Number of Visits 9   Date for PT Re-Evaluation 05/10/17   PT Start Time 6384   PT Stop Time 1354   PT Time Calculation (min) 49 min   Activity Tolerance Patient tolerated treatment well   Behavior During Therapy [facility] for tasks assessed/performed      Past Medical History:  Diagnosis Date  . Arthritis   . Breast cancer ([place]) 01/15/2017   left breast  . Cancer ([place])   . GERD (gastroesophageal reflux disease)   . HLD (hyperlipidemia)   . Hypertension   . Hypothyroidism   . Obesity   . Overactive bladder   . Palpitation   . Thyroid disease     Past Surgical History:  Procedure Laterality Date  . bladder stem stretch  1966  . BREAST LUMPECTOMY WITH RADIOACTIVE SEED AND SENTINEL LYMPH NODE BIOPSY Left 02/16/2017   Procedure: BREAST LUMPECTOMY WITH RADIOACTIVE SEED AND AXILLARY SENTINEL LYMPH NODE BIOPSY;  Surgeon: Rolm Bookbinder, MD;  Location: [place];  Service: General;  Laterality: Left;  . EVACUATION BREAST HEMATOMA Left 03/18/2017   Procedure: EVACUATION SEROMA LEFT BREAST;  Surgeon: Rolm Bookbinder, MD;  Location: [place];  Service: General;  Laterality: Left;  . JOINT REPLACEMENT    . KNEE ARTHROSCOPY     LEFT  . NOSE SURGERY  1980  . SHOULDER SURGERY Left 2007  . TONSILLECTOMY  1964  . TOTAL HIP ARTHROPLASTY Left 10/23/2015   Procedure: TOTAL HIP ARTHROPLASTY ANTERIOR APPROACH;  Surgeon: Melrose Nakayama, MD;  Location: [place];  Service: Orthopedics;  Laterality: Left;    There were no vitals filed for this visit.      Subjective Assessment - 04/22/17  1311    Subjective Had second radiation treatment today, so far so good. Got my cream and deodorant for radiation so started using it.    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 It measures 1.2 cm located in the left upper outer quadrant. It is ER/PR positive and HER2 negative with a Ki67 of 5%. She also has a history of a left rotator cuff repair from 2007. She fell on her left arm 03/2016 reinjuring it but has not gotten treatment to address that.  History also includes THR on the left.  Left lumpectomy with  nodes removed on Feb 26,  She developed a post op seroma that was drained on March 19 and had surgery on March 28 and a drain was inserted and that stayed  in about 10 days, Any drainage has stopped She has not had nor will need chemotherapy.  She is preparing for radiation..    Patient Stated Goals get arms stronger and prevent lymphedema    Currently in Pain? No/denies                         [facility] Adult PT Treatment/Exercise - 04/22/17 0001      Exercises   Exercises Shoulder     Shoulder Exercises: Supine   Horizontal ABduction Strengthening;Both;10 reps;Theraband   Theraband  Level (Shoulder Horizontal ABduction) Level 1 (Yellow)   External Rotation Strengthening;Both;10 reps;Theraband   Theraband Level (Shoulder External Rotation) Level 1 (Yellow)   Flexion Strengthening;Both;10 reps;Theraband  Narrow and Widr Grip, 10 times each   Theraband Level (Shoulder Flexion) Level 1 (Yellow)   Other Supine Exercises Bil D2 with yellow theraband 10 times each     Shoulder Exercises: Standing   Other Standing Exercises All stretches from Strength ABC Program (except low trunk rotation) bil for 15 second holds, 1 time each. Pt returned excellent technique after therapist demo.     Manual Therapy   Manual Therapy Myofascial release;Passive ROM   Myofascial Release left UE myofascial pulling with movement  into abduction to patient tolerance   Passive ROM To Lt shoulder into flexion, abduction, and er to pts tolerance; also into radiation positioning                PT Education - 04/22/17 1404    Education provided Yes   Education Details Began instruction of Strength ABC Packet including all stretches except last one. Did not send packet home today as we did not complete packet.    Person(s) Educated Patient   Methods Explanation;Demonstration   Comprehension Verbalized understanding;Returned demonstration;Need further instruction             [facility] Goals - 04/17/17 1211      CC Long Term Goal  #1   Title Patient with verbalize an understanding of lymphedema risk reduction precautions   Status Achieved     CC Long Term Goal  #2   Title Patient will be independent in home exercise program for strength and range of motion    Status Partially Met     CC Long Term Goal  #3   Title Patient will be independent in self manual lymph drainage   Status On-going     CC Long Term Goal  #4   Title Patient will decrease the DASH score to <10   to demonstrate increased functional use of upper extremity   Status On-going            Plan - 04/22/17 1454    Clinical Impression Statement Reviewed with pt supine scapular series again and she was able to return excellent technique today. She reports doing this in standing at home so instructed her how to perform with proper technique standing against wall with abdominals engaged and head/shoulders against wall. Also issued red theraband for pt to progress with at home. She did well with initial instruction of Strength ABC Packet as well. We were able to get through all stetches except low trunk rotation.    Rehab Potential Excellent   Clinical Impairments Affecting Rehab Potential 4 nodes removed, post op seroma.    PT Frequency 2x / week   PT Duration 4 weeks   PT Treatment/Interventions ADLs/Self Care Home  Management;Patient/family education;DME Instruction;Taping;Therapeutic activities;Manual lymph drainage;Therapeutic exercise;Passive range of motion   PT Next Visit Plan See if pt wants script for doctor to sign for a compression sleeve and bra. Cont low intensity shoulder and upper body strengthening--cont Strength ABC program from low trunk rotation.  Teach self MLD for left breast.   PT Home Exercise Plan Supine scapular series   Consulted and Agree with Plan of Care Patient      Patient will benefit from skilled therapeutic intervention in order to improve the following deficits and impairments:  Postural dysfunction, Decreased knowledge of precautions, Pain, Impaired UE functional  use, Increased edema, Decreased strength, Decreased knowledge of use of DME  Visit Diagnosis: Abnormal posture  Aftercare following surgery for neoplasm  Muscle weakness (generalized)  Carcinoma of upper-outer quadrant of left breast in female, estrogen receptor positive ([place])  Chronic left shoulder pain  Stiffness of left shoulder, not elsewhere classified     Problem List Patient Active Problem List   Diagnosis Date Noted  . Malignant neoplasm of upper-outer quadrant of left breast in female, estrogen receptor positive ([place]) 01/21/2017  . Achilles tendinitis of left lower extremity 09/08/2016  . Primary osteoarthritis of left hip 10/23/2015  . Arthritis of left hip 06/19/2015  . Hip flexor tendinitis 05/29/2015  . Essential hypertension 05/11/2015  . Hypothyroidism 05/11/2015  . Routine general medical examination at a health care facility 05/11/2015  . Obesity 05/11/2015  . Palpitations 01/03/2014  . Chest pain 01/03/2014  . Hyperlipidemia 01/03/2014    Otelia Limes, PTA 04/22/2017, 3:11 PM  [place], [place], [address] Phone: [phone]   Fax:  [phone]  Name: AILEANA HODDER MRN:  [id number] Date of Birth: 13-Dec-1956

## 2017-04-23 ENCOUNTER — Telehealth: Payer: Self-pay | Admitting: Radiation Oncology

## 2017-04-23 ENCOUNTER — Ambulatory Visit
Admission: RE | Admit: 2017-04-23 | Discharge: 2017-04-23 | Disposition: A | Discharge status: Home / Self Care | Payer: Commercial Managed Care - PPO | Source: Ambulatory Visit | Attending: Radiation Oncology | Admitting: Radiation Oncology

## 2017-04-23 DIAGNOSIS — Z510 Encounter for antineoplastic radiation therapy: Secondary | ICD-10-CM | POA: Diagnosis not present

## 2017-04-23 DIAGNOSIS — Z170 Estrogen receptor positive status [ER+]: Secondary | ICD-10-CM | POA: Diagnosis not present

## 2017-04-23 DIAGNOSIS — C50412 Malignant neoplasm of upper-outer quadrant of left female breast: Secondary | ICD-10-CM | POA: Diagnosis not present

## 2017-04-23 NOTE — Telephone Encounter (Addendum)
The patient called back and we discussed that she will take intermittent leave and will need a 6 hour work day for the duration of her radiotherapy

## 2017-04-23 NOTE — Telephone Encounter (Signed)
LM for pt to call me to discuss details on her disability forms for work.

## 2017-04-24 ENCOUNTER — Ambulatory Visit: Payer: Commercial Managed Care - PPO | Admitting: Physical Therapy

## 2017-04-24 ENCOUNTER — Ambulatory Visit
Admission: RE | Admit: 2017-04-24 | Discharge: 2017-04-24 | Disposition: A | Discharge status: Home / Self Care | Payer: Commercial Managed Care - PPO | Source: Ambulatory Visit | Attending: Radiation Oncology | Admitting: Radiation Oncology

## 2017-04-24 ENCOUNTER — Encounter: Payer: Self-pay | Admitting: Radiation Oncology

## 2017-04-24 DIAGNOSIS — Z483 Aftercare following surgery for neoplasm: Secondary | ICD-10-CM

## 2017-04-24 DIAGNOSIS — M6281 Muscle weakness (generalized): Secondary | ICD-10-CM | POA: Diagnosis not present

## 2017-04-24 DIAGNOSIS — Z170 Estrogen receptor positive status [ER+]: Secondary | ICD-10-CM | POA: Diagnosis not present

## 2017-04-24 DIAGNOSIS — R293 Abnormal posture: Secondary | ICD-10-CM

## 2017-04-24 DIAGNOSIS — C50412 Malignant neoplasm of upper-outer quadrant of left female breast: Secondary | ICD-10-CM | POA: Diagnosis not present

## 2017-04-24 DIAGNOSIS — Z510 Encounter for antineoplastic radiation therapy: Secondary | ICD-10-CM | POA: Diagnosis not present

## 2017-04-24 NOTE — Progress Notes (Signed)
Paperwork (unum) received from doctor, faxed to [facility] @ [phone], conf received, copy given to patient 04/24/17

## 2017-04-24 NOTE — Therapy (Signed)
[place], [place], [address] Phone: [phone]   Fax:  [phone]  Physical Therapy Treatment  Patient Details  Name: [name] MRN: [id number] Date of Birth: 11/15/1956 Referring Provider: Dr. Donne Hazel   Encounter Date: 04/24/2017      PT End of Session - 04/24/17 0846    Visit Number 5   Number of Visits 9   Date for PT Re-Evaluation 05/10/17   PT Start Time 0803   PT Stop Time 0845   PT Time Calculation (min) 42 min   Activity Tolerance Patient tolerated treatment well;Patient limited by pain   Behavior During Therapy [facility] for tasks assessed/performed      Past Medical History:  Diagnosis Date  . Arthritis   . Breast cancer ([place]) 01/15/2017   left breast  . Cancer ([place])   . GERD (gastroesophageal reflux disease)   . HLD (hyperlipidemia)   . Hypertension   . Hypothyroidism   . Obesity   . Overactive bladder   . Palpitation   . Thyroid disease     Past Surgical History:  Procedure Laterality Date  . bladder stem stretch  1966  . BREAST LUMPECTOMY WITH RADIOACTIVE SEED AND SENTINEL LYMPH NODE BIOPSY Left 02/16/2017   Procedure: BREAST LUMPECTOMY WITH RADIOACTIVE SEED AND AXILLARY SENTINEL LYMPH NODE BIOPSY;  Surgeon: Rolm Bookbinder, MD;  Location: [place];  Service: General;  Laterality: Left;  . EVACUATION BREAST HEMATOMA Left 03/18/2017   Procedure: EVACUATION SEROMA LEFT BREAST;  Surgeon: Rolm Bookbinder, MD;  Location: [place];  Service: General;  Laterality: Left;  . JOINT REPLACEMENT    . KNEE ARTHROSCOPY     LEFT  . NOSE SURGERY  1980  . SHOULDER SURGERY Left 2007  . TONSILLECTOMY  1964  . TOTAL HIP ARTHROPLASTY Left 10/23/2015   Procedure: TOTAL HIP ARTHROPLASTY ANTERIOR APPROACH;  Surgeon: Melrose Nakayama, MD;  Location: [place];  Service: Orthopedics;  Laterality: Left;    There were no vitals filed for this visit.       Subjective Assessment - 04/24/17 0805    Subjective Doing radiation and that's going good.  Has been walking and doing her Theraband exercises. Will talk to Dr. Lisbeth Renshaw about whether to get compression garments today.    Currently in Pain? No/denies                         [facility] Adult PT Treatment/Exercise - 04/24/17 0001      Lumbar Exercises: Stretches   Lower Trunk Rotation 1 rep  15 seconds each way     Lumbar Exercises: Supine   Bridge 10 reps   Other Supine Lumbar Exercises crunch x 10     Lumbar Exercises: Quadruped   Opposite Arm/Leg Raise Right arm/Left leg;Left arm/Right leg;10 reps     Manual Therapy   Myofascial Release left UE myofascial pulling with movement into abduction to patient tolerance   Passive ROM To Lt shoulder into flexion, abduction, and er to pts tolerance; also into radiation positioning                PT Education - 04/24/17 0843    Education provided Yes   Education Details about strength ABC program guidelines, packet, and log   Person(s) Educated Patient   Methods Explanation   Comprehension Verbalized understanding;Need further instruction              [facility]  Goals - 01/28/17 1337      Patient will be able to verbalize understanding of pertinent lymphedema risk reduction practices relevant to her diagnosis specifically related to skin care.   Time 1   Period Days   Status Achieved     Patient will be able to return demonstrate and/or verbalize understanding of the post-op home exercise program related to regaining shoulder range of motion.   Time 1   Period Days   Status Achieved     Patient will be able to verbalize understanding of the importance of attending the postoperative After Breast Cancer Class for further lymphedema risk reduction education and therapeutic exercise.   Time 1   Period Days   Status Achieved          [facility] Clinic Goals - 04/17/17 1211      CC Long Term Goal  #1    Title Patient with verbalize an understanding of lymphedema risk reduction precautions   Status Achieved     CC Long Term Goal  #2   Title Patient will be independent in home exercise program for strength and range of motion    Status Partially Met     CC Long Term Goal  #3   Title Patient will be independent in self manual lymph drainage   Status On-going     CC Long Term Goal  #4   Title Patient will decrease the DASH score to <10   to demonstrate increased functional use of upper extremity   Status On-going            Plan - 04/24/17 0841    Clinical Impression Statement Patient felt she needed left shoulder stretching today, so time was spent on that.  She continues to have some limitation and be limited by pain; she did better with distraction of left shoulder for abduction ROM.  We continued instruction in strength ABC program, going over general guidelines and adding back stretch and core strengthening elements.  She was given prescription for compression sleeve and bra to have Dr. Lisbeth Renshaw sign if they decide together she should do that.  She may need to continue therapy beyond when her current certification runs out in order to get full ROM.   Rehab Potential Excellent   Clinical Impairments Affecting Rehab Potential 4 nodes removed, post op seroma.    PT Frequency 2x / week   PT Duration 4 weeks   PT Treatment/Interventions ADLs/Self Care Home Management;Patient/family education;DME Instruction;Taping;Therapeutic activities;Manual lymph drainage;Therapeutic exercise;Passive range of motion   PT Next Visit Plan Continue left shoulder ROM; go on to resistance exercise portion of strength ABC program.  Teach manual lymph drainage for left breast.   PT Home Exercise Plan Supine scapular series   Recommended Other Services Gave patient prescription to take to Dr. Lisbeth Renshaw for signature for compression sleeve and bra.   Consulted and Agree with Plan of Care Patient      Patient  will benefit from skilled therapeutic intervention in order to improve the following deficits and impairments:  Postural dysfunction, Decreased knowledge of precautions, Pain, Impaired UE functional use, Increased edema, Decreased strength, Decreased knowledge of use of DME  Visit Diagnosis: Abnormal posture  Aftercare following surgery for neoplasm  Muscle weakness (generalized)     Problem List Patient Active Problem List   Diagnosis Date Noted  . Malignant neoplasm of upper-outer quadrant of left breast in female, estrogen receptor positive ([place]) 01/21/2017  . Achilles tendinitis of left lower extremity  09/08/2016  . Primary osteoarthritis of left hip 10/23/2015  . Arthritis of left hip 06/19/2015  . Hip flexor tendinitis 05/29/2015  . Essential hypertension 05/11/2015  . Hypothyroidism 05/11/2015  . Routine general medical examination at a health care facility 05/11/2015  . Obesity 05/11/2015  . Palpitations 01/03/2014  . Chest pain 01/03/2014  . Hyperlipidemia 01/03/2014    Murray Durrell 04/24/2017, 8:52 AM  [place], [place], [address] Phone: [phone]   Fax:  [phone]  Name: [name] MRN: [id number] Date of Birth: [birth year]  Serafina Royals, PT 04/24/17 8:52 AM

## 2017-04-27 ENCOUNTER — Ambulatory Visit
Admission: RE | Admit: 2017-04-27 | Discharge: 2017-04-27 | Disposition: A | Discharge status: Home / Self Care | Payer: Commercial Managed Care - PPO | Source: Ambulatory Visit | Attending: Radiation Oncology | Admitting: Radiation Oncology

## 2017-04-27 DIAGNOSIS — C50412 Malignant neoplasm of upper-outer quadrant of left female breast: Secondary | ICD-10-CM | POA: Diagnosis not present

## 2017-04-27 DIAGNOSIS — Z510 Encounter for antineoplastic radiation therapy: Secondary | ICD-10-CM | POA: Diagnosis not present

## 2017-04-27 DIAGNOSIS — Z170 Estrogen receptor positive status [ER+]: Secondary | ICD-10-CM | POA: Diagnosis not present

## 2017-04-28 ENCOUNTER — Ambulatory Visit
Admission: RE | Admit: 2017-04-28 | Discharge: 2017-04-28 | Disposition: A | Discharge status: Home / Self Care | Payer: Commercial Managed Care - PPO | Source: Ambulatory Visit | Attending: Radiation Oncology | Admitting: Radiation Oncology

## 2017-04-28 ENCOUNTER — Telehealth: Payer: Self-pay | Admitting: *Deleted

## 2017-04-28 DIAGNOSIS — Z510 Encounter for antineoplastic radiation therapy: Secondary | ICD-10-CM | POA: Diagnosis not present

## 2017-04-28 DIAGNOSIS — Z170 Estrogen receptor positive status [ER+]: Secondary | ICD-10-CM | POA: Diagnosis not present

## 2017-04-28 DIAGNOSIS — C50412 Malignant neoplasm of upper-outer quadrant of left female breast: Secondary | ICD-10-CM | POA: Diagnosis not present

## 2017-04-28 NOTE — Telephone Encounter (Signed)
Returned patient's call c/o sharp pains occasionally in breast and also at times feels like inside her breast is on fire, asked if she could take tylenol or advil , yes  Either pain  Med is okay to take, will see patient Friday after treatment, she has had 5 so far 12:07 PM

## 2017-04-29 ENCOUNTER — Ambulatory Visit: Payer: Commercial Managed Care - PPO

## 2017-04-29 ENCOUNTER — Ambulatory Visit
Admission: RE | Admit: 2017-04-29 | Discharge: 2017-04-29 | Disposition: A | Discharge status: Home / Self Care | Payer: Commercial Managed Care - PPO | Source: Ambulatory Visit | Attending: Radiation Oncology | Admitting: Radiation Oncology

## 2017-04-29 DIAGNOSIS — Z510 Encounter for antineoplastic radiation therapy: Secondary | ICD-10-CM | POA: Diagnosis not present

## 2017-04-29 DIAGNOSIS — M6281 Muscle weakness (generalized): Secondary | ICD-10-CM | POA: Diagnosis not present

## 2017-04-29 DIAGNOSIS — Z483 Aftercare following surgery for neoplasm: Secondary | ICD-10-CM | POA: Diagnosis not present

## 2017-04-29 DIAGNOSIS — M25612 Stiffness of left shoulder, not elsewhere classified: Secondary | ICD-10-CM

## 2017-04-29 DIAGNOSIS — C50412 Malignant neoplasm of upper-outer quadrant of left female breast: Secondary | ICD-10-CM | POA: Diagnosis not present

## 2017-04-29 DIAGNOSIS — M25512 Pain in left shoulder: Secondary | ICD-10-CM

## 2017-04-29 DIAGNOSIS — R293 Abnormal posture: Secondary | ICD-10-CM

## 2017-04-29 DIAGNOSIS — Z170 Estrogen receptor positive status [ER+]: Secondary | ICD-10-CM | POA: Diagnosis not present

## 2017-04-29 DIAGNOSIS — G8929 Other chronic pain: Secondary | ICD-10-CM

## 2017-04-29 NOTE — Therapy (Signed)
[place], [place], [address] Phone: [phone]   Fax:  [phone]  Physical Therapy Treatment  Patient Details  Name: [name] MRN: [id number] Date of Birth: February 18, 1956 Referring Provider: Dr. Donne Hazel   Encounter Date: 04/29/2017      PT End of Session - 04/29/17 1534    Visit Number 6   Number of Visits 9   Date for PT Re-Evaluation 05/10/17   PT Start Time 6237   PT Stop Time 1522   PT Time Calculation (min) 46 min   Activity Tolerance Patient tolerated treatment well   Behavior During Therapy [facility] for tasks assessed/performed      Past Medical History:  Diagnosis Date  . Arthritis   . Breast cancer ([place]) 01/15/2017   left breast  . Cancer ([place])   . GERD (gastroesophageal reflux disease)   . HLD (hyperlipidemia)   . Hypertension   . Hypothyroidism   . Obesity   . Overactive bladder   . Palpitation   . Thyroid disease     Past Surgical History:  Procedure Laterality Date  . bladder stem stretch  1966  . BREAST LUMPECTOMY WITH RADIOACTIVE SEED AND SENTINEL LYMPH NODE BIOPSY Left 02/16/2017   Procedure: BREAST LUMPECTOMY WITH RADIOACTIVE SEED AND AXILLARY SENTINEL LYMPH NODE BIOPSY;  Surgeon: Rolm Bookbinder, MD;  Location: [place];  Service: General;  Laterality: Left;  . EVACUATION BREAST HEMATOMA Left 03/18/2017   Procedure: EVACUATION SEROMA LEFT BREAST;  Surgeon: Rolm Bookbinder, MD;  Location: [place];  Service: General;  Laterality: Left;  . JOINT REPLACEMENT    . KNEE ARTHROSCOPY     LEFT  . NOSE SURGERY  1980  . SHOULDER SURGERY Left 2007  . TONSILLECTOMY  1964  . TOTAL HIP ARTHROPLASTY Left 10/23/2015   Procedure: TOTAL HIP ARTHROPLASTY ANTERIOR APPROACH;  Surgeon: Melrose Nakayama, MD;  Location: [place];  Service: Orthopedics;  Laterality: Left;    There were no vitals filed for this visit.      Subjective Assessment - 04/29/17  1444    Subjective I am so tired from radiation and I can feel my breast burning on the inside.    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 It measures 1.2 cm located in the left upper outer quadrant. It is ER/PR positive and HER2 negative with a Ki67 of 5%. She also has a history of a left rotator cuff repair from 2007. She fell on her left arm 03/2016 reinjuring it but has not gotten treatment to address that.  History also includes THR on the left.  Left lumpectomy with  nodes removed on Feb 26,  She developed a post op seroma that was drained on March 19 and had surgery on March 28 and a drain was inserted and that stayed  in about 10 days, Any drainage has stopped She has not had nor will need chemotherapy.  She is preparing for radiation..    Patient Stated Goals get arms stronger and prevent lymphedema    Currently in Pain? No/denies                         [facility] Adult PT Treatment/Exercise - 04/29/17 0001      Shoulder Exercises: Standing   Other Standing Exercises Completed Strengthening exercises from Strength ABC Packet using 1 lb and doing 10 reps each.  Shoulder Exercises: Pulleys   Flexion 2 minutes   ABduction 2 minutes     Manual Therapy   Manual Therapy Myofascial release;Passive ROM   Myofascial Release left UE myofascial pulling with movement into abduction to patient tolerance   Passive ROM To Lt shoulder into flexion, abduction, and er to pts tolerance; also into radiation positioning                PT Education - 04/29/17 1534    Education provided Yes   Education Details Completed Strength ABC Program instruction and issued packet to pt   Person(s) Educated Patient   Methods Explanation;Demonstration;Handout   Comprehension Verbalized understanding;Returned demonstration               [place] [facility] Goals - 04/17/17 1211      CC Long Term Goal  #1    Title Patient with verbalize an understanding of lymphedema risk reduction precautions   Status Achieved     CC Long Term Goal  #2   Title Patient will be independent in home exercise program for strength and range of motion    Status Partially Met     CC Long Term Goal  #3   Title Patient will be independent in self manual lymph drainage   Status On-going     CC Long Term Goal  #4   Title Patient will decrease the DASH score to <10   to demonstrate increased functional use of upper extremity   Status On-going            Plan - 04/29/17 1541    Clinical Impression Statement Pt did well with finishing instruction of the strength portion of strength ABC Program. She was able to return good demonstrations with minor cuing for correct technique or positioning. Her tightness was much improved today from last visit and her P/ROM was near full ROM with flexion and ~15-20 degrees limited with abduction and she reported no pain at end ROM. After session she reported feeling good. Pt may possibly be on hold after next weeks visits, until her radiation is over, as she is feeling overwhelmed right now with having daily radiation appts and PT but will decide this after seeing Dr. Lisbeth Renshaw after radiation on Friday.  Dr. Lisbeth Renshaw did sign her presctription for compression garments and issued information to pt for [facility] to make appts when she feels ready.   Rehab Potential Excellent   Clinical Impairments Affecting Rehab Potential 4 nodes removed, post op seroma.    PT Frequency 2x / week   PT Duration 4 weeks   PT Treatment/Interventions ADLs/Self Care Home Management;Patient/family education;DME Instruction;Taping;Therapeutic activities;Manual lymph drainage;Therapeutic exercise;Passive range of motion   PT Next Visit Plan Continue left shoulder ROM; review strength ABC program prn.  Teach manual lymph drainage for left breast.   Consulted and Agree with Plan of Care Patient       Patient will benefit from skilled therapeutic intervention in order to improve the following deficits and impairments:  Postural dysfunction, Decreased knowledge of precautions, Pain, Impaired UE functional use, Increased edema, Decreased strength, Decreased knowledge of use of DME  Visit Diagnosis: Abnormal posture  Aftercare following surgery for neoplasm  Muscle weakness (generalized)  Carcinoma of upper-outer quadrant of left breast in female, estrogen receptor positive ([facility])  Chronic left shoulder pain  Stiffness of left shoulder, not elsewhere classified     Problem List Patient Active Problem List   Diagnosis Date  Noted  . Malignant neoplasm of upper-outer quadrant of left breast in female, estrogen receptor positive ([place]) 01/21/2017  . Achilles tendinitis of left lower extremity 09/08/2016  . Primary osteoarthritis of left hip 10/23/2015  . Arthritis of left hip 06/19/2015  . Hip flexor tendinitis 05/29/2015  . Essential hypertension 05/11/2015  . Hypothyroidism 05/11/2015  . Routine general medical examination at a health care facility 05/11/2015  . Obesity 05/11/2015  . Palpitations 01/03/2014  . Chest pain 01/03/2014  . Hyperlipidemia 01/03/2014    Otelia Limes, PTA 04/29/2017, 3:48 PM  [place], [place], [address] Phone: [phone]   Fax:  [phone]  Name: [name] MRN: [id number] Date of Birth: 1956/02/05

## 2017-04-30 ENCOUNTER — Ambulatory Visit
Admission: RE | Admit: 2017-04-30 | Discharge: 2017-04-30 | Disposition: A | Discharge status: Home / Self Care | Payer: Commercial Managed Care - PPO | Source: Ambulatory Visit | Attending: Radiation Oncology | Admitting: Radiation Oncology

## 2017-04-30 DIAGNOSIS — C50412 Malignant neoplasm of upper-outer quadrant of left female breast: Secondary | ICD-10-CM | POA: Diagnosis not present

## 2017-04-30 DIAGNOSIS — Z510 Encounter for antineoplastic radiation therapy: Secondary | ICD-10-CM | POA: Diagnosis not present

## 2017-04-30 DIAGNOSIS — Z170 Estrogen receptor positive status [ER+]: Secondary | ICD-10-CM | POA: Diagnosis not present

## 2017-05-01 ENCOUNTER — Ambulatory Visit: Payer: Commercial Managed Care - PPO

## 2017-05-01 ENCOUNTER — Ambulatory Visit
Admission: RE | Admit: 2017-05-01 | Discharge: 2017-05-01 | Disposition: A | Discharge status: Home / Self Care | Payer: Commercial Managed Care - PPO | Source: Ambulatory Visit | Attending: Radiation Oncology | Admitting: Radiation Oncology

## 2017-05-01 DIAGNOSIS — Z483 Aftercare following surgery for neoplasm: Secondary | ICD-10-CM

## 2017-05-01 DIAGNOSIS — Z170 Estrogen receptor positive status [ER+]: Secondary | ICD-10-CM | POA: Diagnosis not present

## 2017-05-01 DIAGNOSIS — C50412 Malignant neoplasm of upper-outer quadrant of left female breast: Secondary | ICD-10-CM

## 2017-05-01 DIAGNOSIS — R293 Abnormal posture: Secondary | ICD-10-CM | POA: Diagnosis not present

## 2017-05-01 DIAGNOSIS — G8929 Other chronic pain: Secondary | ICD-10-CM

## 2017-05-01 DIAGNOSIS — M25512 Pain in left shoulder: Secondary | ICD-10-CM

## 2017-05-01 DIAGNOSIS — Z510 Encounter for antineoplastic radiation therapy: Secondary | ICD-10-CM | POA: Diagnosis not present

## 2017-05-01 DIAGNOSIS — M25612 Stiffness of left shoulder, not elsewhere classified: Secondary | ICD-10-CM

## 2017-05-01 DIAGNOSIS — M6281 Muscle weakness (generalized): Secondary | ICD-10-CM | POA: Diagnosis not present

## 2017-05-01 NOTE — Therapy (Signed)
[place], [place], [address] Phone: [phone]   Fax:  [phone]  Physical Therapy Treatment  Patient Details  Name: [name] MRN: [id number] Date of Birth: 01/17/1956 Referring Provider: Dr. Donne Hazel   Encounter Date: 05/01/2017      PT End of Session - 05/01/17 0857    Visit Number 7   Number of Visits 9   Date for PT Re-Evaluation 05/10/17   PT Start Time 0804   PT Stop Time 0848   PT Time Calculation (min) 44 min   Activity Tolerance Patient tolerated treatment well   Behavior During Therapy [facility] for tasks assessed/performed      Past Medical History:  Diagnosis Date  . Arthritis   . Breast cancer ([place]) 01/15/2017   left breast  . Cancer ([place])   . GERD (gastroesophageal reflux disease)   . HLD (hyperlipidemia)   . Hypertension   . Hypothyroidism   . Obesity   . Overactive bladder   . Palpitation   . Thyroid disease     Past Surgical History:  Procedure Laterality Date  . bladder stem stretch  1966  . BREAST LUMPECTOMY WITH RADIOACTIVE SEED AND SENTINEL LYMPH NODE BIOPSY Left 02/16/2017   Procedure: BREAST LUMPECTOMY WITH RADIOACTIVE SEED AND AXILLARY SENTINEL LYMPH NODE BIOPSY;  Surgeon: Rolm Bookbinder, MD;  Location: [place];  Service: General;  Laterality: Left;  . EVACUATION BREAST HEMATOMA Left 03/18/2017   Procedure: EVACUATION SEROMA LEFT BREAST;  Surgeon: Rolm Bookbinder, MD;  Location: [place];  Service: General;  Laterality: Left;  . JOINT REPLACEMENT    . KNEE ARTHROSCOPY     LEFT  . NOSE SURGERY  1980  . SHOULDER SURGERY Left 2007  . TONSILLECTOMY  1964  . TOTAL HIP ARTHROPLASTY Left 10/23/2015   Procedure: TOTAL HIP ARTHROPLASTY ANTERIOR APPROACH;  Surgeon: Melrose Nakayama, MD;  Location: [place];  Service: Orthopedics;  Laterality: Left;    There were no vitals filed for this visit.      Subjective Assessment -  05/01/17 0807    Subjective The burning on the inside of my breast is actually a little better and my skin is doing okay. I feel pretty good today.   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 It measures 1.2 cm located in the left upper outer quadrant. It is ER/PR positive and HER2 negative with a Ki67 of 5%. She also has a history of a left rotator cuff repair from 2007. She fell on her left arm 03/2016 reinjuring it but has not gotten treatment to address that.  History also includes THR on the left.  Left lumpectomy with  nodes removed on Feb 26,  She developed a post op seroma that was drained on March 19 and had surgery on March 28 and a drain was inserted and that stayed  in about 10 days, Any drainage has stopped She has not had nor will need chemotherapy.  She is preparing for radiation..    Patient Stated Goals get arms stronger and prevent lymphedema    Currently in Pain? No/denies                         [facility] Adult PT Treatment/Exercise - 05/01/17 0001      Shoulder Exercises: Pulleys   Flexion 2 minutes   ABduction 2 minutes     Shoulder  Exercises: Therapy Ball   Flexion 10 reps  with forward lean into end of stretch     Manual Therapy   Manual Therapy Myofascial release;Passive ROM   Myofascial Release left UE myofascial pulling with movement into abduction to patient tolerance   Passive ROM To Lt shoulder into flexion, abduction, and er to pts tolerance; also into radiation positioning                       [facility] Goals - 05/01/17 0905      CC Long Term Goal  #1   Title Patient with verbalize an understanding of lymphedema risk reduction precautions   Status Achieved     CC Long Term Goal  #2   Title Patient will be independent in home exercise program for strength and range of motion    Status Partially Met     CC Long Term Goal  #3   Title Patient will be  independent in self manual lymph drainage   Status On-going     CC Long Term Goal  #4   Title Patient will decrease the DASH score to <10   to demonstrate increased functional use of upper extremity   Status On-going            Plan - 05/01/17 0857    Clinical Impression Statement Focused on stretching and manual therapy today for pt. Her end P/ROM is becoming less painful and improving with each visit. She is still considering being on hold at some point during radiation but at this time is planning on coming to next weeks visits. Instructed pt to do Strength ABC Program before next appt with [place] which isn't until Wed so she can bring back any questions she might have and we can assess her technique. Pt is not intersted in learning self manual lymph drainage at this time.   Rehab Potential Excellent   Clinical Impairments Affecting Rehab Potential 4 nodes removed, post op seroma.    PT Frequency 2x / week   PT Duration 4 weeks   PT Treatment/Interventions ADLs/Self Care Home Management;Patient/family education;DME Instruction;Taping;Therapeutic activities;Manual lymph drainage;Therapeutic exercise;Passive range of motion   PT Next Visit Plan Have pt retake DASH for goal assess. Continue left shoulder ROM; review strength ABC program prn.  Teach manual lymph drainage for left breast if pt decides she wants to learn.   Consulted and Agree with Plan of Care Patient      Patient will benefit from skilled therapeutic intervention in order to improve the following deficits and impairments:  Postural dysfunction, Decreased knowledge of precautions, Pain, Impaired UE functional use, Increased edema, Decreased strength, Decreased knowledge of use of DME  Visit Diagnosis: Abnormal posture  Aftercare following surgery for neoplasm  Carcinoma of upper-outer quadrant of left breast in female, estrogen receptor positive ([place])  Chronic left shoulder pain  Stiffness of left shoulder, not  elsewhere classified     Problem List Patient Active Problem List   Diagnosis Date Noted  . Malignant neoplasm of upper-outer quadrant of left breast in female, estrogen receptor positive ([place]) 01/21/2017  . Achilles tendinitis of left lower extremity 09/08/2016  . Primary osteoarthritis of left hip 10/23/2015  . Arthritis of left hip 06/19/2015  . Hip flexor tendinitis 05/29/2015  . Essential hypertension 05/11/2015  . Hypothyroidism 05/11/2015  . Routine general medical examination at a health care facility 05/11/2015  . Obesity 05/11/2015  . Palpitations 01/03/2014  . Chest pain 01/03/2014  .  Hyperlipidemia 01/03/2014    Otelia Limes, PTA 05/01/2017, 9:06 AM  [place], [place], [address] Phone: [phone]   Fax:  [phone]  Name: [name] MRN: [id number] Date of Birth: [birth year]

## 2017-05-04 ENCOUNTER — Ambulatory Visit
Admission: RE | Admit: 2017-05-04 | Discharge: 2017-05-04 | Disposition: A | Discharge status: Home / Self Care | Payer: Commercial Managed Care - PPO | Source: Ambulatory Visit | Attending: Radiation Oncology | Admitting: Radiation Oncology

## 2017-05-04 DIAGNOSIS — Z510 Encounter for antineoplastic radiation therapy: Secondary | ICD-10-CM | POA: Diagnosis not present

## 2017-05-04 DIAGNOSIS — C50412 Malignant neoplasm of upper-outer quadrant of left female breast: Secondary | ICD-10-CM | POA: Diagnosis not present

## 2017-05-04 DIAGNOSIS — Z170 Estrogen receptor positive status [ER+]: Secondary | ICD-10-CM | POA: Diagnosis not present

## 2017-05-05 ENCOUNTER — Ambulatory Visit
Admission: RE | Admit: 2017-05-05 | Discharge: 2017-05-05 | Disposition: A | Discharge status: Home / Self Care | Payer: Commercial Managed Care - PPO | Source: Ambulatory Visit | Attending: Radiation Oncology | Admitting: Radiation Oncology

## 2017-05-05 DIAGNOSIS — Z510 Encounter for antineoplastic radiation therapy: Secondary | ICD-10-CM | POA: Diagnosis not present

## 2017-05-05 DIAGNOSIS — C50412 Malignant neoplasm of upper-outer quadrant of left female breast: Secondary | ICD-10-CM | POA: Diagnosis not present

## 2017-05-05 DIAGNOSIS — Z170 Estrogen receptor positive status [ER+]: Secondary | ICD-10-CM | POA: Diagnosis not present

## 2017-05-06 ENCOUNTER — Ambulatory Visit
Admission: RE | Admit: 2017-05-06 | Discharge: 2017-05-06 | Disposition: A | Discharge status: Home / Self Care | Payer: Commercial Managed Care - PPO | Source: Ambulatory Visit | Attending: Radiation Oncology | Admitting: Radiation Oncology

## 2017-05-06 ENCOUNTER — Ambulatory Visit: Payer: Commercial Managed Care - PPO

## 2017-05-06 DIAGNOSIS — Z510 Encounter for antineoplastic radiation therapy: Secondary | ICD-10-CM | POA: Diagnosis not present

## 2017-05-06 DIAGNOSIS — C50412 Malignant neoplasm of upper-outer quadrant of left female breast: Secondary | ICD-10-CM | POA: Diagnosis not present

## 2017-05-06 DIAGNOSIS — M25612 Stiffness of left shoulder, not elsewhere classified: Secondary | ICD-10-CM

## 2017-05-06 DIAGNOSIS — M6281 Muscle weakness (generalized): Secondary | ICD-10-CM | POA: Diagnosis not present

## 2017-05-06 DIAGNOSIS — Z483 Aftercare following surgery for neoplasm: Secondary | ICD-10-CM

## 2017-05-06 DIAGNOSIS — R293 Abnormal posture: Secondary | ICD-10-CM

## 2017-05-06 DIAGNOSIS — Z170 Estrogen receptor positive status [ER+]: Secondary | ICD-10-CM

## 2017-05-06 DIAGNOSIS — G8929 Other chronic pain: Secondary | ICD-10-CM

## 2017-05-06 DIAGNOSIS — M25512 Pain in left shoulder: Secondary | ICD-10-CM

## 2017-05-06 NOTE — Therapy (Signed)
[place], [place], [address] Phone: [phone]   Fax:  [phone]  Physical Therapy Treatment  Patient Details  Name: [name] MRN: [id number] Date of Birth: November 13, 1956 Referring Provider: Dr. Donne Hazel   Encounter Date: 05/06/2017      PT End of Session - 05/06/17 1518    Visit Number 8   Number of Visits 9   Date for PT Re-Evaluation 05/10/17   PT Start Time 1435   PT Stop Time 1518   PT Time Calculation (min) 43 min   Activity Tolerance Patient tolerated treatment well   Behavior During Therapy [facility] for tasks assessed/performed      Past Medical History:  Diagnosis Date  . Arthritis   . Breast cancer ([place]) 01/15/2017   left breast  . Cancer ([place])   . GERD (gastroesophageal reflux disease)   . HLD (hyperlipidemia)   . Hypertension   . Hypothyroidism   . Obesity   . Overactive bladder   . Palpitation   . Thyroid disease     Past Surgical History:  Procedure Laterality Date  . bladder stem stretch  1966  . BREAST LUMPECTOMY WITH RADIOACTIVE SEED AND SENTINEL LYMPH NODE BIOPSY Left 02/16/2017   Procedure: BREAST LUMPECTOMY WITH RADIOACTIVE SEED AND AXILLARY SENTINEL LYMPH NODE BIOPSY;  Surgeon: Rolm Bookbinder, MD;  Location: [place];  Service: General;  Laterality: Left;  . EVACUATION BREAST HEMATOMA Left 03/18/2017   Procedure: EVACUATION SEROMA LEFT BREAST;  Surgeon: Rolm Bookbinder, MD;  Location: [place];  Service: General;  Laterality: Left;  . JOINT REPLACEMENT    . KNEE ARTHROSCOPY     LEFT  . NOSE SURGERY  1980  . SHOULDER SURGERY Left 2007  . TONSILLECTOMY  1964  . TOTAL HIP ARTHROPLASTY Left 10/23/2015   Procedure: TOTAL HIP ARTHROPLASTY ANTERIOR APPROACH;  Surgeon: Melrose Nakayama, MD;  Location: [place];  Service: Orthopedics;  Laterality: Left;    There were no vitals filed for this visit.      Subjective Assessment -  05/06/17 1441    Subjective Nothing new to report except just really tired todayfrom yesterday. I don't know why but yesterday I was really tired.    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 It measures 1.2 cm located in the left upper outer quadrant. It is ER/PR positive and HER2 negative with a Ki67 of 5%. She also has a history of a left rotator cuff repair from 2007. She fell on her left arm 03/2016 reinjuring it but has not gotten treatment to address that.  History also includes THR on the left.  Left lumpectomy with  nodes removed on Feb 26,  She developed a post op seroma that was drained on March 19 and had surgery on March 28 and a drain was inserted and that stayed  in about 10 days, Any drainage has stopped She has not had nor will need chemotherapy.  She is preparing for radiation..    Patient Stated Goals get arms stronger and prevent lymphedema    Currently in Pain? No/denies                         [facility] Adult PT Treatment/Exercise - 05/06/17 0001      Manual Therapy   Manual Therapy Myofascial release;Passive ROM   Myofascial Release left UE myofascial pulling with movement into abduction  to patient tolerance   Passive ROM To Lt shoulder into flexion, abduction, and er to pts tolerance; also into radiation positioning                       [facility] Goals - 05/01/17 0905      CC Long Term Goal  #1   Title Patient with verbalize an understanding of lymphedema risk reduction precautions   Status Achieved     CC Long Term Goal  #2   Title Patient will be independent in home exercise program for strength and range of motion    Status Partially Met     CC Long Term Goal  #3   Title Patient will be independent in self manual lymph drainage   Status On-going     CC Long Term Goal  #4   Title Patient will decrease the DASH score to <10   to demonstrate increased  functional use of upper extremity   Status On-going            Plan - 05/06/17 1519    Clinical Impression Statement Pt has made excellent progress overall and she is pleased with her progress as well and reports will be ready for D/C at next visit.    Rehab Potential Excellent   Clinical Impairments Affecting Rehab Potential 4 nodes removed, post op seroma.    PT Frequency 2x / week   PT Duration 4 weeks   PT Next Visit Plan Have pt retake DASH for goal assess. Plan to D/C next visit.    Consulted and Agree with Plan of Care Patient      Patient will benefit from skilled therapeutic intervention in order to improve the following deficits and impairments:  Postural dysfunction, Decreased knowledge of precautions, Pain, Impaired UE functional use, Increased edema, Decreased strength, Decreased knowledge of use of DME  Visit Diagnosis: Abnormal posture  Aftercare following surgery for neoplasm  Carcinoma of upper-outer quadrant of left breast in female, estrogen receptor positive ([place])  Chronic left shoulder pain  Stiffness of left shoulder, not elsewhere classified     Problem List Patient Active Problem List   Diagnosis Date Noted  . Malignant neoplasm of upper-outer quadrant of left breast in female, estrogen receptor positive ([place]) 01/21/2017  . Achilles tendinitis of left lower extremity 09/08/2016  . Primary osteoarthritis of left hip 10/23/2015  . Arthritis of left hip 06/19/2015  . Hip flexor tendinitis 05/29/2015  . Essential hypertension 05/11/2015  . Hypothyroidism 05/11/2015  . Routine general medical examination at a health care facility 05/11/2015  . Obesity 05/11/2015  . Palpitations 01/03/2014  . Chest pain 01/03/2014  . Hyperlipidemia 01/03/2014    Otelia Limes, PTA 05/06/2017, 3:20 PM  [place], [place], [address] Phone: [phone]   Fax:   [phone]  Name: [name] MRN: [id number] Date of Birth: 05/24/1956

## 2017-05-07 ENCOUNTER — Ambulatory Visit
Admission: RE | Admit: 2017-05-07 | Discharge: 2017-05-07 | Disposition: A | Discharge status: Home / Self Care | Payer: Commercial Managed Care - PPO | Source: Ambulatory Visit | Attending: Radiation Oncology | Admitting: Radiation Oncology

## 2017-05-07 DIAGNOSIS — Z510 Encounter for antineoplastic radiation therapy: Secondary | ICD-10-CM | POA: Diagnosis not present

## 2017-05-07 DIAGNOSIS — Z170 Estrogen receptor positive status [ER+]: Secondary | ICD-10-CM | POA: Diagnosis not present

## 2017-05-07 DIAGNOSIS — C50412 Malignant neoplasm of upper-outer quadrant of left female breast: Secondary | ICD-10-CM | POA: Diagnosis not present

## 2017-05-08 ENCOUNTER — Ambulatory Visit
Admission: RE | Admit: 2017-05-08 | Discharge: 2017-05-08 | Disposition: A | Discharge status: Home / Self Care | Payer: Commercial Managed Care - PPO | Source: Ambulatory Visit | Attending: Radiation Oncology | Admitting: Radiation Oncology

## 2017-05-08 ENCOUNTER — Ambulatory Visit: Payer: Commercial Managed Care - PPO | Admitting: Physical Therapy

## 2017-05-08 ENCOUNTER — Encounter: Payer: Self-pay | Admitting: Physical Therapy

## 2017-05-08 DIAGNOSIS — Z483 Aftercare following surgery for neoplasm: Secondary | ICD-10-CM | POA: Diagnosis not present

## 2017-05-08 DIAGNOSIS — R293 Abnormal posture: Secondary | ICD-10-CM

## 2017-05-08 DIAGNOSIS — G8929 Other chronic pain: Secondary | ICD-10-CM

## 2017-05-08 DIAGNOSIS — Z170 Estrogen receptor positive status [ER+]: Secondary | ICD-10-CM | POA: Diagnosis not present

## 2017-05-08 DIAGNOSIS — M25612 Stiffness of left shoulder, not elsewhere classified: Secondary | ICD-10-CM

## 2017-05-08 DIAGNOSIS — M6281 Muscle weakness (generalized): Secondary | ICD-10-CM

## 2017-05-08 DIAGNOSIS — C50412 Malignant neoplasm of upper-outer quadrant of left female breast: Secondary | ICD-10-CM | POA: Diagnosis not present

## 2017-05-08 DIAGNOSIS — Z510 Encounter for antineoplastic radiation therapy: Secondary | ICD-10-CM | POA: Diagnosis not present

## 2017-05-08 DIAGNOSIS — M25512 Pain in left shoulder: Secondary | ICD-10-CM

## 2017-05-08 NOTE — Therapy (Signed)
[place], [place], [address] Phone: [phone]   Fax:  [phone]  Physical Therapy Treatment  Patient Details  Name: [name] MRN: [id number] Date of Birth: 03-19-1956 Referring Provider: Dr. Donne Hazel   Encounter Date: 05/08/2017      PT End of Session - 05/08/17 0855    Visit Number 9   Number of Visits 9   Date for PT Re-Evaluation 05/10/17   PT Start Time 0800   PT Stop Time 0843   PT Time Calculation (min) 43 min   Activity Tolerance Patient tolerated treatment well   Behavior During Therapy [facility] for tasks assessed/performed      Past Medical History:  Diagnosis Date  . Arthritis   . Breast cancer ([place]) 01/15/2017   left breast  . Cancer ([place])   . GERD (gastroesophageal reflux disease)   . HLD (hyperlipidemia)   . Hypertension   . Hypothyroidism   . Obesity   . Overactive bladder   . Palpitation   . Thyroid disease     Past Surgical History:  Procedure Laterality Date  . bladder stem stretch  1966  . BREAST LUMPECTOMY WITH RADIOACTIVE SEED AND SENTINEL LYMPH NODE BIOPSY Left 02/16/2017   Procedure: BREAST LUMPECTOMY WITH RADIOACTIVE SEED AND AXILLARY SENTINEL LYMPH NODE BIOPSY;  Surgeon: Rolm Bookbinder, MD;  Location: [place];  Service: General;  Laterality: Left;  . EVACUATION BREAST HEMATOMA Left 03/18/2017   Procedure: EVACUATION SEROMA LEFT BREAST;  Surgeon: Rolm Bookbinder, MD;  Location: [place];  Service: General;  Laterality: Left;  . JOINT REPLACEMENT    . KNEE ARTHROSCOPY     LEFT  . NOSE SURGERY  1980  . SHOULDER SURGERY Left 2007  . TONSILLECTOMY  1964  . TOTAL HIP ARTHROPLASTY Left 10/23/2015   Procedure: TOTAL HIP ARTHROPLASTY ANTERIOR APPROACH;  Surgeon: Melrose Nakayama, MD;  Location: [place];  Service: Orthopedics;  Laterality: Left;    There were no vitals filed for this visit.      Subjective Assessment -  05/08/17 0848    Subjective Pt has had 13 radiation treatments and is doing well. She is pleased with her progress and is ready to discharge from PT    Pertinent History Patient was diagnosed on 01/05/17 with left grade 1 invasive lobular carcinoma breast cancer. It measures 1.2 cm located in the left upper outer quadrant. It is ER/PR positive and HER2 negative with a Ki67 of 5%. She also has a history of a left rotator cuff repair from 2007. She fell on her left arm 03/2016 reinjuring it but has not gotten treatment to address that.  History also includes THR on the left.  Left lumpectomy with  nodes removed on Feb 26,  She developed a post op seroma that was drained on March 19 and had surgery on March 28 and a drain was inserted and that stayed  in about 10 days, Any drainage has stopped She has not had nor will need chemotherapy.  She is undergoing radiation    Patient Stated Goals get arms stronger and prevent lymphedema     Currently in Pain? No/denies            [facility] PT Assessment - 05/08/17 0001      Observation/Other Assessments   Quick DASH  0     AROM   Left Shoulder Flexion 160 Degrees   Left Shoulder ABduction 170 Degrees   Left Shoulder Internal  Rotation 65 Degrees   Left Shoulder External Rotation 85 Degrees           LYMPHEDEMA/ONCOLOGY QUESTIONNAIRE - 05/08/17 0819      Left Upper Extremity Lymphedema   10 cm Proximal to Olecranon Process 31.5 cm   Olecranon Process 27.7 cm   10 cm Proximal to Ulnar Styloid Process 24 cm   Just Proximal to Ulnar Styloid Process 17.1 cm   Across Hand at [employer] 19.8 cm   At [place] of 2nd Digit 6.4 cm           Quick Dash - 05/08/17 0001    Open a tight or new jar No difficulty   Do heavy household chores (wash walls, wash floors) No difficulty   Carry a shopping bag or briefcase No difficulty   Wash your back No difficulty   Use a knife to cut food No difficulty   Recreational activities in which you take some  force or impact through your arm, shoulder, or hand (golf, hammering, tennis) No difficulty   During the past week, to what extent has your arm, shoulder or hand problem interfered with your normal social activities with family, friends, neighbors, or groups? Not at all   During the past week, to what extent has your arm, shoulder or hand problem limited your work or other regular daily activities Not at all   Arm, shoulder, or hand pain. None   Tingling (pins and needles) in your arm, shoulder, or hand None   Difficulty Sleeping No difficulty   DASH Score 0 %               [facility] Adult PT Treatment/Exercise - 05/08/17 0001      Self-Care   Other Self-Care Comments  instructed patient in self manua lymph drainage and what to look for in a compression bra if needed. showed pt options . also gave flyer for [employer] and exercise options at [place] [facility] Manual Lymphatic Drainage (MLD)   Manual Lymphatic Drainage (MLD) Instructed patient in self manual lymph drainage as she reports she has swelling in hands and to fulfill goals,: diaphragmatic breaths, elevate arm, stationary circles or pumps from upper arm down to hand with return along pathway if she feels symptoms of fullness in arm                 PT Education - 05/08/17 0854    Education provided Yes   Education Details brief self manual lymph drainage, what to look for in compression bra with contact information for DME, post treatment exercise options    Person(s) Educated Patient   Methods Explanation;Handout   Comprehension Verbalized understanding               [place] - 05/08/17 0857      CC Long Term Goal  #1   Title Patient with verbalize an understanding of lymphedema risk reduction precautions   Status Achieved     CC Long Term Goal  #2   Title Patient will be independent in home exercise program for strength and range of motion    Status  Achieved     CC Long Term Goal  #3   Title Patient will be independent in self manual lymph drainage   Status Achieved     CC Long Term Goal  #4   Title Patient will decrease the DASH score to <10  to demonstrate increased functional use of upper extremity   Baseline 18.18 on eval, 9 at discharge  demonstrating full functional return of left UE    Status Achieved            Plan - 05/08/17 0855    Clinical Impression Statement Pt has done well and her goals have been met for return to fuction of left arm and to know what to do to prevent lymphedema. She knows what to do to continue exercise.  She is ready to discharge from PT    PT Next Visit Plan Discharge       Patient will benefit from skilled therapeutic intervention in order to improve the following deficits and impairments:     Visit Diagnosis: Abnormal posture  Aftercare following surgery for neoplasm  Chronic left shoulder pain  Stiffness of left shoulder, not elsewhere classified  Muscle weakness (generalized)     Problem List Patient Active Problem List   Diagnosis Date Noted  . Malignant neoplasm of upper-outer quadrant of left breast in female, estrogen receptor positive ([place]) 01/21/2017  . Achilles tendinitis of left lower extremity 09/08/2016  . Primary osteoarthritis of left hip 10/23/2015  . Arthritis of left hip 06/19/2015  . Hip flexor tendinitis 05/29/2015  . Essential hypertension 05/11/2015  . Hypothyroidism 05/11/2015  . Routine general medical examination at a health care facility 05/11/2015  . Obesity 05/11/2015  . Palpitations 01/03/2014  . Chest pain 01/03/2014  . Hyperlipidemia 01/03/2014   PHYSICAL THERAPY DISCHARGE SUMMARY  Visits from Start of Care: 9  Current functional level related to goals / functional outcomes: Full function of left UE   Remaining deficits: none   Education / Equipment: Home exercise, lymphedema risk reduction, self manual lymph drainage and use of  compression for prophylaxis Plan: Patient agrees to discharge.  Patient goals were met. Patient is being discharged due to meeting the stated rehab goals.  ?????    Donato Heinz. Owens Shark PT  Norwood Levo 05/08/2017, 8:59 AM  [place], [place], [address] Phone: [phone]   Fax:  [phone]  Name: VICKEY BOAK MRN: [id number] Date of Birth: 1956-12-14

## 2017-05-08 NOTE — Patient Instructions (Signed)
First of all, check with your insurance company to see if provider is in [facility]                                            [address]  [place], [place] [address] [phone]    Does not file for insurance--- call for appointment with [facility]   (for wigs and compression sleeves / gloves/gauntlets )  [place], [place] [address] [phone]  Will file some insurances --- call for appointment   Second to [facility] (for mastectomy prosthetics and garments) [place], [place] [address] [phone] Will file some insurances --- call for appointment    LOOK AT [place]  [address]  [place], [place] [address] [phone] Lower extremity garments  [facility] [address] [place], [place]  [address] [place] Sales rep:  [place]:  [phone] [email or web address] Biocompression pumps   [employer]  Sales rep: Donneta Romberg:  [phone] [email or web address] [place] Pulley and Flexitouch pumps    Other Resources: National Lymphedema Network:  www.lymphnet.org www.[email or web address] for patient articles and purchase a self manual lymph drainage DVD [email or web address] has informative articles.

## 2017-05-11 ENCOUNTER — Ambulatory Visit
Admission: RE | Admit: 2017-05-11 | Discharge: 2017-05-11 | Disposition: A | Discharge status: Home / Self Care | Payer: Commercial Managed Care - PPO | Source: Ambulatory Visit | Attending: Radiation Oncology | Admitting: Radiation Oncology

## 2017-05-11 DIAGNOSIS — Z510 Encounter for antineoplastic radiation therapy: Secondary | ICD-10-CM | POA: Diagnosis not present

## 2017-05-11 DIAGNOSIS — Z170 Estrogen receptor positive status [ER+]: Secondary | ICD-10-CM | POA: Diagnosis not present

## 2017-05-11 DIAGNOSIS — C50412 Malignant neoplasm of upper-outer quadrant of left female breast: Secondary | ICD-10-CM | POA: Diagnosis not present

## 2017-05-12 ENCOUNTER — Ambulatory Visit
Admission: RE | Admit: 2017-05-12 | Discharge: 2017-05-12 | Disposition: A | Discharge status: Home / Self Care | Payer: Commercial Managed Care - PPO | Source: Ambulatory Visit | Attending: Radiation Oncology | Admitting: Radiation Oncology

## 2017-05-12 DIAGNOSIS — C50412 Malignant neoplasm of upper-outer quadrant of left female breast: Secondary | ICD-10-CM | POA: Diagnosis not present

## 2017-05-12 DIAGNOSIS — Z510 Encounter for antineoplastic radiation therapy: Secondary | ICD-10-CM | POA: Diagnosis not present

## 2017-05-12 DIAGNOSIS — Z170 Estrogen receptor positive status [ER+]: Secondary | ICD-10-CM | POA: Diagnosis not present

## 2017-05-13 ENCOUNTER — Ambulatory Visit
Admission: RE | Admit: 2017-05-13 | Discharge: 2017-05-13 | Disposition: A | Discharge status: Home / Self Care | Payer: Commercial Managed Care - PPO | Source: Ambulatory Visit | Attending: Radiation Oncology | Admitting: Radiation Oncology

## 2017-05-13 DIAGNOSIS — Z170 Estrogen receptor positive status [ER+]: Secondary | ICD-10-CM | POA: Diagnosis not present

## 2017-05-13 DIAGNOSIS — C50412 Malignant neoplasm of upper-outer quadrant of left female breast: Secondary | ICD-10-CM | POA: Diagnosis not present

## 2017-05-13 DIAGNOSIS — Z510 Encounter for antineoplastic radiation therapy: Secondary | ICD-10-CM | POA: Diagnosis not present

## 2017-05-14 ENCOUNTER — Ambulatory Visit
Admission: RE | Admit: 2017-05-14 | Discharge: 2017-05-14 | Disposition: A | Discharge status: Home / Self Care | Payer: Commercial Managed Care - PPO | Source: Ambulatory Visit | Attending: Radiation Oncology | Admitting: Radiation Oncology

## 2017-05-14 DIAGNOSIS — Z170 Estrogen receptor positive status [ER+]: Secondary | ICD-10-CM | POA: Diagnosis not present

## 2017-05-14 DIAGNOSIS — Z510 Encounter for antineoplastic radiation therapy: Secondary | ICD-10-CM | POA: Diagnosis not present

## 2017-05-14 DIAGNOSIS — C50412 Malignant neoplasm of upper-outer quadrant of left female breast: Secondary | ICD-10-CM | POA: Diagnosis not present

## 2017-05-15 ENCOUNTER — Ambulatory Visit
Admission: RE | Admit: 2017-05-15 | Discharge: 2017-05-15 | Disposition: A | Discharge status: Home / Self Care | Payer: Commercial Managed Care - PPO | Source: Ambulatory Visit | Attending: Radiation Oncology | Admitting: Radiation Oncology

## 2017-05-15 DIAGNOSIS — C50412 Malignant neoplasm of upper-outer quadrant of left female breast: Secondary | ICD-10-CM | POA: Diagnosis not present

## 2017-05-15 DIAGNOSIS — Z170 Estrogen receptor positive status [ER+]: Secondary | ICD-10-CM | POA: Diagnosis not present

## 2017-05-15 DIAGNOSIS — Z510 Encounter for antineoplastic radiation therapy: Secondary | ICD-10-CM | POA: Diagnosis not present

## 2017-05-19 ENCOUNTER — Ambulatory Visit
Admission: RE | Admit: 2017-05-19 | Discharge: 2017-05-19 | Disposition: A | Discharge status: Home / Self Care | Payer: Commercial Managed Care - PPO | Source: Ambulatory Visit | Attending: Radiation Oncology | Admitting: Radiation Oncology

## 2017-05-19 DIAGNOSIS — Z170 Estrogen receptor positive status [ER+]: Secondary | ICD-10-CM | POA: Diagnosis not present

## 2017-05-19 DIAGNOSIS — Z510 Encounter for antineoplastic radiation therapy: Secondary | ICD-10-CM | POA: Diagnosis not present

## 2017-05-19 DIAGNOSIS — C50412 Malignant neoplasm of upper-outer quadrant of left female breast: Secondary | ICD-10-CM | POA: Diagnosis not present

## 2017-05-20 ENCOUNTER — Ambulatory Visit
Admission: RE | Admit: 2017-05-20 | Discharge: 2017-05-20 | Disposition: A | Discharge status: Home / Self Care | Payer: Commercial Managed Care - PPO | Source: Ambulatory Visit | Attending: Radiation Oncology | Admitting: Radiation Oncology

## 2017-05-20 DIAGNOSIS — Z510 Encounter for antineoplastic radiation therapy: Secondary | ICD-10-CM | POA: Diagnosis not present

## 2017-05-20 DIAGNOSIS — Z170 Estrogen receptor positive status [ER+]: Secondary | ICD-10-CM | POA: Diagnosis not present

## 2017-05-20 DIAGNOSIS — C50412 Malignant neoplasm of upper-outer quadrant of left female breast: Secondary | ICD-10-CM | POA: Diagnosis not present

## 2017-05-21 ENCOUNTER — Ambulatory Visit
Admission: RE | Admit: 2017-05-21 | Discharge: 2017-05-21 | Disposition: A | Discharge status: Home / Self Care | Payer: Commercial Managed Care - PPO | Source: Ambulatory Visit | Attending: Radiation Oncology | Admitting: Radiation Oncology

## 2017-05-21 DIAGNOSIS — C50412 Malignant neoplasm of upper-outer quadrant of left female breast: Secondary | ICD-10-CM | POA: Diagnosis not present

## 2017-05-21 DIAGNOSIS — Z170 Estrogen receptor positive status [ER+]: Secondary | ICD-10-CM | POA: Diagnosis not present

## 2017-05-21 DIAGNOSIS — Z510 Encounter for antineoplastic radiation therapy: Secondary | ICD-10-CM | POA: Diagnosis not present

## 2017-05-22 ENCOUNTER — Ambulatory Visit
Admission: RE | Admit: 2017-05-22 | Discharge: 2017-05-22 | Disposition: A | Discharge status: Home / Self Care | Payer: Commercial Managed Care - PPO | Source: Ambulatory Visit | Attending: Radiation Oncology | Admitting: Radiation Oncology

## 2017-05-22 DIAGNOSIS — Z510 Encounter for antineoplastic radiation therapy: Secondary | ICD-10-CM | POA: Diagnosis not present

## 2017-05-22 DIAGNOSIS — C50412 Malignant neoplasm of upper-outer quadrant of left female breast: Secondary | ICD-10-CM | POA: Diagnosis not present

## 2017-05-22 DIAGNOSIS — Z170 Estrogen receptor positive status [ER+]: Secondary | ICD-10-CM | POA: Diagnosis not present

## 2017-05-25 ENCOUNTER — Ambulatory Visit
Admission: RE | Admit: 2017-05-25 | Payer: Commercial Managed Care - PPO | Source: Ambulatory Visit | Admitting: Radiation Oncology

## 2017-05-25 ENCOUNTER — Ambulatory Visit
Admission: RE | Admit: 2017-05-25 | Discharge: 2017-05-25 | Disposition: A | Discharge status: Home / Self Care | Payer: Commercial Managed Care - PPO | Source: Ambulatory Visit | Attending: Radiation Oncology | Admitting: Radiation Oncology

## 2017-05-25 DIAGNOSIS — Z170 Estrogen receptor positive status [ER+]: Secondary | ICD-10-CM | POA: Diagnosis not present

## 2017-05-25 DIAGNOSIS — C50412 Malignant neoplasm of upper-outer quadrant of left female breast: Secondary | ICD-10-CM | POA: Diagnosis not present

## 2017-05-25 DIAGNOSIS — Z510 Encounter for antineoplastic radiation therapy: Secondary | ICD-10-CM | POA: Diagnosis not present

## 2017-05-26 ENCOUNTER — Ambulatory Visit
Admission: RE | Admit: 2017-05-26 | Discharge: 2017-05-26 | Disposition: A | Discharge status: Home / Self Care | Payer: Commercial Managed Care - PPO | Source: Ambulatory Visit | Attending: Radiation Oncology | Admitting: Radiation Oncology

## 2017-05-26 DIAGNOSIS — Z510 Encounter for antineoplastic radiation therapy: Secondary | ICD-10-CM | POA: Diagnosis not present

## 2017-05-26 DIAGNOSIS — Z170 Estrogen receptor positive status [ER+]: Secondary | ICD-10-CM | POA: Diagnosis not present

## 2017-05-26 DIAGNOSIS — C50412 Malignant neoplasm of upper-outer quadrant of left female breast: Secondary | ICD-10-CM | POA: Diagnosis not present

## 2017-05-27 ENCOUNTER — Ambulatory Visit
Admission: RE | Admit: 2017-05-27 | Discharge: 2017-05-27 | Disposition: A | Discharge status: Home / Self Care | Payer: Commercial Managed Care - PPO | Source: Ambulatory Visit | Attending: Radiation Oncology | Admitting: Radiation Oncology

## 2017-05-27 DIAGNOSIS — Z170 Estrogen receptor positive status [ER+]: Secondary | ICD-10-CM | POA: Diagnosis not present

## 2017-05-27 DIAGNOSIS — Z510 Encounter for antineoplastic radiation therapy: Secondary | ICD-10-CM | POA: Diagnosis not present

## 2017-05-27 DIAGNOSIS — C50412 Malignant neoplasm of upper-outer quadrant of left female breast: Secondary | ICD-10-CM | POA: Diagnosis not present

## 2017-05-28 ENCOUNTER — Ambulatory Visit
Admission: RE | Admit: 2017-05-28 | Discharge: 2017-05-28 | Disposition: A | Discharge status: Home / Self Care | Payer: Commercial Managed Care - PPO | Source: Ambulatory Visit | Attending: Radiation Oncology | Admitting: Radiation Oncology

## 2017-05-28 DIAGNOSIS — Z510 Encounter for antineoplastic radiation therapy: Secondary | ICD-10-CM | POA: Diagnosis not present

## 2017-05-28 DIAGNOSIS — C50412 Malignant neoplasm of upper-outer quadrant of left female breast: Secondary | ICD-10-CM | POA: Diagnosis not present

## 2017-05-28 DIAGNOSIS — Z170 Estrogen receptor positive status [ER+]: Secondary | ICD-10-CM | POA: Diagnosis not present

## 2017-05-29 ENCOUNTER — Ambulatory Visit
Admission: RE | Admit: 2017-05-29 | Discharge: 2017-05-29 | Disposition: A | Discharge status: Home / Self Care | Payer: Commercial Managed Care - PPO | Source: Ambulatory Visit | Attending: Radiation Oncology | Admitting: Radiation Oncology

## 2017-05-29 DIAGNOSIS — C50412 Malignant neoplasm of upper-outer quadrant of left female breast: Secondary | ICD-10-CM | POA: Diagnosis not present

## 2017-05-29 DIAGNOSIS — Z510 Encounter for antineoplastic radiation therapy: Secondary | ICD-10-CM | POA: Diagnosis not present

## 2017-05-29 DIAGNOSIS — Z170 Estrogen receptor positive status [ER+]: Secondary | ICD-10-CM | POA: Diagnosis not present

## 2017-06-01 ENCOUNTER — Ambulatory Visit
Admission: RE | Admit: 2017-06-01 | Discharge: 2017-06-01 | Disposition: A | Discharge status: Home / Self Care | Payer: Commercial Managed Care - PPO | Source: Ambulatory Visit | Attending: Radiation Oncology | Admitting: Radiation Oncology

## 2017-06-01 DIAGNOSIS — Z510 Encounter for antineoplastic radiation therapy: Secondary | ICD-10-CM | POA: Diagnosis not present

## 2017-06-01 DIAGNOSIS — Z170 Estrogen receptor positive status [ER+]: Secondary | ICD-10-CM | POA: Diagnosis not present

## 2017-06-01 DIAGNOSIS — C50412 Malignant neoplasm of upper-outer quadrant of left female breast: Secondary | ICD-10-CM | POA: Diagnosis not present

## 2017-06-02 ENCOUNTER — Ambulatory Visit
Admission: RE | Admit: 2017-06-02 | Discharge: 2017-06-02 | Disposition: A | Discharge status: Home / Self Care | Payer: Commercial Managed Care - PPO | Source: Ambulatory Visit | Attending: Radiation Oncology | Admitting: Radiation Oncology

## 2017-06-02 DIAGNOSIS — C50412 Malignant neoplasm of upper-outer quadrant of left female breast: Secondary | ICD-10-CM | POA: Diagnosis not present

## 2017-06-02 DIAGNOSIS — Z170 Estrogen receptor positive status [ER+]: Secondary | ICD-10-CM | POA: Diagnosis not present

## 2017-06-02 DIAGNOSIS — Z510 Encounter for antineoplastic radiation therapy: Secondary | ICD-10-CM | POA: Diagnosis not present

## 2017-06-03 ENCOUNTER — Ambulatory Visit
Admission: RE | Admit: 2017-06-03 | Discharge: 2017-06-03 | Disposition: A | Discharge status: Home / Self Care | Payer: Commercial Managed Care - PPO | Source: Ambulatory Visit | Attending: Radiation Oncology | Admitting: Radiation Oncology

## 2017-06-03 ENCOUNTER — Telehealth: Payer: Self-pay | Admitting: Hematology

## 2017-06-03 DIAGNOSIS — C50912 Malignant neoplasm of unspecified site of left female breast: Secondary | ICD-10-CM | POA: Diagnosis not present

## 2017-06-03 DIAGNOSIS — Z510 Encounter for antineoplastic radiation therapy: Secondary | ICD-10-CM | POA: Insufficient documentation

## 2017-06-03 DIAGNOSIS — C50412 Malignant neoplasm of upper-outer quadrant of left female breast: Secondary | ICD-10-CM | POA: Diagnosis not present

## 2017-06-03 DIAGNOSIS — Z170 Estrogen receptor positive status [ER+]: Secondary | ICD-10-CM | POA: Insufficient documentation

## 2017-06-03 DIAGNOSIS — Z79899 Other long term (current) drug therapy: Secondary | ICD-10-CM | POA: Insufficient documentation

## 2017-06-03 NOTE — Telephone Encounter (Signed)
Left  Message re new f/u 7/5 - per YF moved from 6/18 to 7/5. Schedule mailed.

## 2017-06-04 ENCOUNTER — Ambulatory Visit
Admission: RE | Admit: 2017-06-04 | Discharge: 2017-06-04 | Disposition: A | Discharge status: Home / Self Care | Payer: Commercial Managed Care - PPO | Source: Ambulatory Visit | Attending: Radiation Oncology | Admitting: Radiation Oncology

## 2017-06-04 DIAGNOSIS — C50412 Malignant neoplasm of upper-outer quadrant of left female breast: Secondary | ICD-10-CM | POA: Diagnosis not present

## 2017-06-04 DIAGNOSIS — Z170 Estrogen receptor positive status [ER+]: Secondary | ICD-10-CM | POA: Diagnosis not present

## 2017-06-04 DIAGNOSIS — C50912 Malignant neoplasm of unspecified site of left female breast: Secondary | ICD-10-CM | POA: Diagnosis not present

## 2017-06-04 DIAGNOSIS — Z510 Encounter for antineoplastic radiation therapy: Secondary | ICD-10-CM | POA: Diagnosis not present

## 2017-06-05 ENCOUNTER — Ambulatory Visit
Admission: RE | Admit: 2017-06-05 | Discharge: 2017-06-05 | Disposition: A | Discharge status: Home / Self Care | Payer: Commercial Managed Care - PPO | Source: Ambulatory Visit | Attending: Radiation Oncology | Admitting: Radiation Oncology

## 2017-06-05 DIAGNOSIS — Z510 Encounter for antineoplastic radiation therapy: Secondary | ICD-10-CM | POA: Diagnosis not present

## 2017-06-05 DIAGNOSIS — Z170 Estrogen receptor positive status [ER+]: Secondary | ICD-10-CM | POA: Diagnosis not present

## 2017-06-05 DIAGNOSIS — C50412 Malignant neoplasm of upper-outer quadrant of left female breast: Secondary | ICD-10-CM | POA: Diagnosis not present

## 2017-06-05 DIAGNOSIS — C50912 Malignant neoplasm of unspecified site of left female breast: Secondary | ICD-10-CM | POA: Diagnosis not present

## 2017-06-08 ENCOUNTER — Ambulatory Visit: Payer: Commercial Managed Care - PPO | Admitting: Hematology

## 2017-06-08 ENCOUNTER — Ambulatory Visit
Admission: RE | Admit: 2017-06-08 | Discharge: 2017-06-08 | Disposition: A | Discharge status: Home / Self Care | Payer: Commercial Managed Care - PPO | Source: Ambulatory Visit | Attending: Radiation Oncology | Admitting: Radiation Oncology

## 2017-06-08 DIAGNOSIS — C50412 Malignant neoplasm of upper-outer quadrant of left female breast: Secondary | ICD-10-CM | POA: Diagnosis not present

## 2017-06-08 DIAGNOSIS — Z510 Encounter for antineoplastic radiation therapy: Secondary | ICD-10-CM | POA: Diagnosis not present

## 2017-06-08 DIAGNOSIS — C50912 Malignant neoplasm of unspecified site of left female breast: Secondary | ICD-10-CM | POA: Diagnosis not present

## 2017-06-08 DIAGNOSIS — Z170 Estrogen receptor positive status [ER+]: Secondary | ICD-10-CM | POA: Diagnosis not present

## 2017-06-09 ENCOUNTER — Encounter: Payer: Self-pay | Admitting: Radiation Oncology

## 2017-06-09 ENCOUNTER — Encounter: Payer: Self-pay | Admitting: *Deleted

## 2017-06-09 ENCOUNTER — Ambulatory Visit
Admission: RE | Admit: 2017-06-09 | Discharge: 2017-06-09 | Disposition: A | Discharge status: Home / Self Care | Payer: Commercial Managed Care - PPO | Source: Ambulatory Visit | Attending: Radiation Oncology | Admitting: Radiation Oncology

## 2017-06-09 DIAGNOSIS — C50412 Malignant neoplasm of upper-outer quadrant of left female breast: Secondary | ICD-10-CM | POA: Diagnosis not present

## 2017-06-09 DIAGNOSIS — C50912 Malignant neoplasm of unspecified site of left female breast: Secondary | ICD-10-CM | POA: Diagnosis not present

## 2017-06-09 DIAGNOSIS — Z170 Estrogen receptor positive status [ER+]: Secondary | ICD-10-CM | POA: Diagnosis not present

## 2017-06-09 DIAGNOSIS — Z510 Encounter for antineoplastic radiation therapy: Secondary | ICD-10-CM | POA: Diagnosis not present

## 2017-06-16 NOTE — Progress Notes (Signed)
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 The Left breast was treated to 50.4 Gy in 28 fractions at 1.8 Gy per fraction. 2. The Left breast was boosted to 14 Gy in 7 fractions at 2 Gy per fraction.  Beams/energy:    1. Left tangent fields // 10X, 15X, 6X 2. Photon boost // 15X, 6X  Narrative: The patient tolerated radiation treatment relatively well. She developed diffuse erythema with some hyperpigmentation with no moist desquamation of the left breast. She experienced skin irritation with treatment.  Plan: The patient has completed radiation treatment. The patient will return to radiation oncology clinic for routine followup in one month. I advised them to call or return sooner if they have any questions or concerns related to their recovery or treatment.  ------------------------------------------------  Jodelle Gross, MD, PhD  This document serves as a record of services personally performed by Kyung Rudd, MD. It was created on his behalf by Arlyce Harman, a trained medical scribe. The creation of this record is based on the scribe's personal observations and the provider's statements to them. This document has been checked and approved by the attending provider.

## 2017-06-23 NOTE — Progress Notes (Signed)
[facility]  [phone] Fax:([phone]  Clinic Follow Up Note   Patient Care Team: Hoyt Koch, MD as PCP - General (Internal Medicine) 06/25/2017  CHIEF COMPLAINTS:  Follow up left breast cancer  Oncology History   Cancer Staging Malignant neoplasm of upper-outer quadrant of left breast in female, estrogen receptor positive ([place]) Staging form: Breast, AJCC 8th Edition - Clinical stage from 01/15/2017: Stage IA (cT1c, cN0, cM0, G1, ER: Positive, PR: Positive, HER2: Negative) - Signed by Truitt Merle, MD on 01/27/2017      Malignant neoplasm of upper-outer quadrant of left breast in female, estrogen receptor positive ([place])   01/15/2017 Initial Biopsy    Left breast 2:00 core needle biopsy showed invasive lobular carcinoma, atypical lobular hyperplasia. Grade 1.      01/15/2017 Receptors her2    ER 90% positive, PR 90% positive, HER-2 negative, Ki-67 1%      01/15/2017 Mammogram    Bilateral diagnostic mammogram and ultrasound showed irregular hypoechoic mass in the left breast 2:00 position, 9 cm from the nipple, measuring 1.2 x 1.0 x 0.8 cm, no evidence of malignancy within the right breast, ultrasound of the left axilla was negative. Breast density category B      01/15/2017 Initial Diagnosis    Malignant neoplasm of upper-outer quadrant of left breast in female, estrogen receptor positive ([place])      02/16/2017 Pathology Results    Diagnosis 1. Breast, lumpectomy, Left - INVASIVE GRADE I LOBULAR CARCINOMA. - TUMOR IS ESTIMATED TO SPAN AT LEAST 2.1 CM IN GREATEST DIMENSION. 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 - INVASIVE TUMOR IS FOCALLY LESS THAN 0.1 CM TO LATERAL MARGIN BUT INKED MARGINAL SURFACE IS NEGATIVE FOR TUMOR. - INITIAL ANTERIOR MARGIN CANNOT BE EFFECTIVELY CLEARED OF INVASIVE TUMOR, PLEASE SEE SPECIMEN #10 FOR FINAL MARGIN STATUS. - SEE ONCOLOGY TEMPLATE. 2. 4 left axillary sentinel lymph  nodes were negative 3. Additional resection of left posterior, superior, anterior and inferior margins were benign.       02/16/2017 Surgery    breast lumpectomy with radioactive seed and axillary sentinel lymph node biposy with Dr. Donne Hazel      02/16/2017 Oncotype testing    Testing resulted in reccurance score of 12.      02/16/2017 Pathology Results    Left breast lumpectomy showed a 2.1 cm invasive grade 1 lobular carcinoma, associated LCIS and atypical lobular hyperplasia. Surgical margins were negative. 4 sentinel lymph nodes were negative.      02/16/2017 Oncotype testing    RS 12, low risk, predicts 10 year risk of distant recurrence 8% with tamoxifen, adjuvant chemotherapy was not recommended.      03/09/2017 Imaging     IMPRESSION: Large complex fluid collection extending throughout the upper left breast, 3:00 to 9:00 axes, at posterior depth, presumed hematoma, possibly multiloculated.      03/18/2017 Surgery     Evacuation seroma left bresat with Dr. Donne Hazel       04/21/2017 - 06/08/2017 Radiation Therapy    Patient received radiation treatment with Dr. Lisbeth Renshaw       HISTORY OF PRESENTING ILLNESS:  [name] 61 y.o. female is here because of recently diagnosed left breast invasive lobular carcinoma. She presents to our multidisciplinary breast clinic today.   Her cancer was discovered by screening mammogram. She has no palpable breast mass or adenopathy. She feels well, no pain or other complain. She has mild left shoulder pain since 2007. She  had left hip repalcement in 2016   The patient underwent bilateral diagnostic mammogram and bilateral breast ultrasound on 01/15/2017 to evaluate areas of possible architectural distortion within each breast. These scans sowed an irregular hypoechoic mass in the left breast at the 2 o'clock axis, 9 cm from the nipple, measuring 1.2 x 0.8 x 1 cm, a probable correlate for the architectural distortion seen on mammogram. There was  no evidence of malignancy within the right breast.   Biopsy of the left breast at the 2:00 o'clock position on 01/15/2017 revealed invasive lobular carcinoma, atypical lobular hyperplasia, and microcalcifications present. Pathology compatible with grade 1 breast carcinoma, ER/PR positive, HER-2 negative, Ki67 1%.  GYN HISTORY  Menarchal: 14 LMP: age of 79 Contraceptive: 43 years  HRT: no G1P1: one son, at age of 73   CURRENT THERAPY: pending letrozole   INTERIM HISTORY:  She presents to the clinic today for follow up and to discuss antiestrogen treatment. She has been doing well .She finished radiation. She is still tender in breast and axilla area. She has no other concerns.   MEDICAL HISTORY:  Past Medical History:  Diagnosis Date  . Arthritis   . Breast cancer ([place]) 01/15/2017   left breast  . Cancer ([place])   . GERD (gastroesophageal reflux disease)   . HLD (hyperlipidemia)   . Hypertension   . Hypothyroidism   . Obesity   . Overactive bladder   . Palpitation   . Thyroid disease     SURGICAL HISTORY: Past Surgical History:  Procedure Laterality Date  . bladder stem stretch  1966  . BREAST LUMPECTOMY WITH RADIOACTIVE SEED AND SENTINEL LYMPH NODE BIOPSY Left 02/16/2017   Procedure: BREAST LUMPECTOMY WITH RADIOACTIVE SEED AND AXILLARY SENTINEL LYMPH NODE BIOPSY;  Surgeon: Rolm Bookbinder, MD;  Location: [facility];  Service: General;  Laterality: Left;  . EVACUATION BREAST HEMATOMA Left 03/18/2017   Procedure: EVACUATION SEROMA LEFT BREAST;  Surgeon: Rolm Bookbinder, MD;  Location: [facility];  Service: General;  Laterality: Left;  . JOINT REPLACEMENT    . KNEE ARTHROSCOPY     LEFT  . NOSE SURGERY  1980  . SHOULDER SURGERY Left 2007  . TONSILLECTOMY  1964  . TOTAL HIP ARTHROPLASTY Left 10/23/2015   Procedure: TOTAL HIP ARTHROPLASTY ANTERIOR APPROACH;  Surgeon: Melrose Nakayama, MD;  Location: [facility];  Service: Orthopedics;  Laterality: Left;      SOCIAL HISTORY: Social History   Social History  . Marital status: Single    Spouse name: N/A  . Number of children: N/A  . Years of education: N/A   Occupational History  . Not on file.   Social History Main Topics  . Smoking status: Never Smoker  . Smokeless tobacco: Never Used  . Alcohol use No  . Drug use: No  . Sexual activity: Not on file   Other Topics Concern  . Not on file   Social History Narrative  . No narrative on file    FAMILY HISTORY: Family History  Problem Relation Age of Onset  . Heart Problems Father        cardiac arrest  . Heart attack Mother   . Heart disease Mother   . Cancer Maternal Uncle 15       colon cancer   . Cancer Maternal Grandmother        bladder cancer     ALLERGIES:  has No Known Allergies.  MEDICATIONS:  Current Outpatient Prescriptions  Medication Sig Dispense  Refill  . clobetasol cream (TEMOVATE) 0.27 % Apply 1 application topically 2 (two) times daily as needed (itching).     . hydrochlorothiazide (HYDRODIURIL) 25 MG tablet TAKE 1 TABLET BY MOUTH EVERY DAY 30 tablet 3  . levothyroxine (SYNTHROID, LEVOTHROID) 88 MCG tablet Take 88 mcg by mouth daily. Take 2 tabs for 2 days  Alternate with 44 mcg for 1 day, then so forth.  12  . metoprolol succinate (TOPROL-XL) 25 MG 24 hr tablet TAKE 1 TABLET BY MOUTH EVERY DAY (Patient taking differently: TAKE 1/2 TABLET BY MOUTH EVERY DAY) 90 tablet 3  . pantoprazole (PROTONIX) 40 MG tablet Take 1 tablet (40 mg total) by mouth daily. Pt. needs to set up follow up appt. 30 tablet 0  . simvastatin (ZOCOR) 20 MG tablet Take 20 mg by mouth daily.    Marland Kitchen acetaminophen (TYLENOL) 325 MG tablet Take 650 mg by mouth every 6 (six) hours as needed for mild pain.    Marland Kitchen letrozole (FEMARA) 2.5 MG tablet Take 1 tablet (2.5 mg total) by mouth daily. 30 tablet 2   No current facility-administered medications for this visit.     REVIEW OF SYSTEMS:   Constitutional: Denies fevers, chills or abnormal  night sweats Eyes: Denies blurriness of vision, double vision or watery eyes Ears, nose, mouth, throat, and face: Denies mucositis or sore throat Respiratory: Denies cough, dyspnea or wheezes Cardiovascular: Denies palpitation, chest discomfort or lower extremity swelling Gastrointestinal:  Denies nausea, heartburn or change in bowel habits Skin: Denies abnormal skin rashes Lymphatics: Denies new lymphadenopathy or easy bruising Neurological:Denies numbness, tingling or new weaknesses Behavioral/Psych: Mood is stable, no new changes  BREAST: (+)tenderness  All other systems were reviewed with the patient and are negative.  PHYSICAL EXAMINATION: ECOG PERFORMANCE STATUS: 0 - Asymptomatic  Vitals:   06/25/17 1558  BP: (!) 141/78  Pulse: 73  Resp: 20  Temp: (!) 97 F (36.1 C)   Filed Weights   06/25/17 1558  Weight: 212 lb 4.8 oz (96.3 kg)    GENERAL:alert, no distress and comfortable SKIN: skin color, texture, turgor are normal, no rashes or significant lesions EYES: normal, conjunctiva are pink and non-injected, sclera clear OROPHARYNX:no exudate, no erythema and lips, buccal mucosa, and tongue normal  NECK: supple, thyroid normal size, non-tender, without nodularity LYMPH:  no palpable lymphadenopathy in the cervical, axillary or inguinal LUNGS: clear to auscultation and percussion with normal breathing effort HEART: regular rate & rhythm and no murmurs and no lower extremity edema ABDOMEN:abdomen soft, non-tender and normal bowel sounds Musculoskeletal:no cyanosis of digits and no clubbing  PSYCH: alert & oriented x 3 with fluent speech NEURO: no focal motor/sensory deficits Breasts: Breast inspection showed them to be symmetrical with no nipple discharge. Surgical scar in the left breast has healed very well. No significant skin change from radiation. Palpation of the breasts and axilla revealed no obvious mass that I could appreciate.   LABORATORY DATA:  I have reviewed  the data as listed CBC Latest Ref Rng & Units 01/28/2017 10/16/2015  WBC 3.9 - 10.3 10e3/uL 6.9 10.3  Hemoglobin 11.6 - 15.9 g/dL 13.7 14.1  Hematocrit 34.8 - 46.6 % 42.3 41.9  Platelets 145 - 400 10e3/uL 342 339    CMP Latest Ref Rng & Units 02/11/2017 01/28/2017 10/16/2015  Glucose 65 - 99 mg/dL 97 97 91  BUN 6 - 20 mg/dL 10 12.4 7  Creatinine 0.44 - 1.00 mg/dL 0.89 1.0 0.89  Sodium 135 - 145 mmol/L 136  140 140  Potassium 3.5 - 5.1 mmol/L 3.4(L) 3.2(L) 3.1(L)  Chloride 101 - 111 mmol/L 97(L) - 101  CO2 22 - 32 mmol/L _0 Calcium 8.9 - 10.3 mg/dL 9.5 10.0 9.9  Total Protein 6.4 - 8.3 g/dL - 7.8 -  Total Bilirubin 0.20 - 1.20 mg/dL - 0.68 -  Alkaline Phos 40 - 150 U/L - 117 -  AST 5 - 34 U/L - 17 -  ALT 0 - 55 U/L - 17 -   PATHOLOGY Diagnosis 02/16/2017 1. Breast, lumpectomy, Left - INVASIVE GRADE I LOBULAR CARCINOMA. - TUMOR IS ESTIMATED TO SPAN AT LEAST 2.1 CM IN GREATEST DIMENSION. 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 - INVASIVE TUMOR IS FOCALLY LESS THAN 0.1 CM TO LATERAL MARGIN BUT INKED MARGINAL SURFACE IS NEGATIVE FOR TUMOR. - INITIAL ANTERIOR MARGIN CANNOT BE EFFECTIVELY CLEARED OF INVASIVE TUMOR, PLEASE SEE SPECIMEN #10 FOR FINAL MARGIN STATUS. - SEE ONCOLOGY TEMPLATE. 2. Lymph node, sentinel, biopsy, Left Axillary - ONE BENIGN LYMPH NODE NODE WITH NO TUMOR SEEN (0/1). - SEE COMMENT. 3. Lymph node, sentinel, biopsy, Left Axillary - ONE BENIGN LYMPH NODE NODE WITH NO TUMOR SEEN (0/1). - SEE COMMENT. 4. Lymph node, sentinel, biopsy, Left Axillary - ONE BENIGN LYMPH NODE NODE WITH NO TUMOR SEEN (0/1). - SEE COMMENT. 5. Lymph node, sentinel, biopsy, Left Axillary - ONE BENIGN LYMPH NODE NODE WITH NO TUMOR SEEN (0/1). - SEE COMMENT. 6. Breast, excision, Left additional Posterior Margin - BENIGN BREAST PARENCHYMA AND FIBROFATTY SOFT TISSUE. - NO ATYPIA OR TUMOR SEEN. 7. Breast, excision, Left additional Superior Margin - BENIGN  BREAST PARENCHYMA AND FIBROFATTY SOFT TISSUE. - NO ATYPIA OR TUMOR SEEN. 8. Breast, excision, Left additional Inferior Margin - BREAST PARENCHYMA SHOWING USUAL DUCTAL HYPERPLASIA. - INCIDENTAL HYALINIZED FIBROADENOMA WITH CALCIFICATIONS. - NO ATYPIA OR TUMOR SEEN. 1 of 5 FINAL for Firebaugh, Shaden L ([id number]) Diagnosis(continued) 9. Breast, excision, Left additional Medial Margin - BENIGN BREAST PARENCHYMA AND FIBROFATTY SOFT TISSUE. - NO ATYPIA OR TUMOR SEEN. 10. Breast, excision, Left additional Anterior Margin - BENIGN FIBROFATTY SOFT TISSUE. - NO ATYPIA OR TUMOR SEEN. Microscopic Comment 1. BREAST, INVASIVE TUMOR Procedure: Left lumpectomy with left sentinel lymph node biopsies and additional posterior, superior, medial and anterior left margin excisions. Laterality: Left. Tumor Size: At least 2.1 cm in greatest dimension, see below comment. Histologic Type: Invasive lobular carcinoma. Grade: 1. Tubular Differentiation: 3. Nuclear Pleomorphism: 1. Mitotic Count: 1. Ductal Carcinoma in Situ (DCIS): Not identified. Extent of Tumor: Tumor confined to breast parenchyma. Skin: Not received. Nipple: Not received. Skeletal muscle: Not received. Margins: Invasive carcinoma, distance from closest margin: Invasive lobular carcinoma is focally less than 0.1 cm from lateral margin; The initial anterior margin cannot be effectively cleared of invasive tumor but the additional anterior margin (specimen #10) is benign. DCIS, distance from closest margin: Ductal carcinoma in situ is not identified. Regional Lymph Nodes: Number of Lymph Nodes Examined: 4. Number of Sentinel Lymph Nodes Examined: 4. Lymph Nodes with Macrometastases: 0. Lymph Nodes with Micrometastases: 0. Lymph Nodes with Isolated Tumor Cells: 0. Breast Prognostic Profile: Performed on previous case, [id number]: Estrogen Receptor: 90%, positive. Progesterone Receptor: 90%, positive. Her-2: 1.24 ratio, negative. Ki-67:  1%. Pathologic Stage Classification (pTNM, AJCC 8th Edition): Primary Tumor (pT): pT2. Regional Lymph Nodes (pN): pN0. Distant Metastases (pM): MX. Comments: After review of the microscopic slide sections, the gross specimen is re-evaluated. The tumor mass is grossly 1.1 cm away from the lateral  margin and the combined dimension of the tumor mass added with the tissue taken at the lateral margin is at least 2.1 cm (the total tumor dimension is estimated to measure at least 2.1 cm correlating with a pT2 tumor). A cytokeratin AE1/AE3 immunohistochemical stain is performed on two blocks, 1D and 51M to assess the extent of the tumor with respect to the margins on the lumpectomy specimen. The findings are consistent with the above assessment. 2. - 5. A cytokeratin AE1/AE3 immunohistochemical stain is performed on all lymph node tissue (four stains total). The stains fail to demonstrate evidence of metastatic carcinoma. (RH:ecj 02/17/2017)   Diagnosis 01/15/2017 Breast, left, needle core biopsy, 2:00 o'clock - INVASIVE LOBULAR CARCINOMA. - ATYPICAL LOBULAR HYPERPLASIA. - MICROCALCIFICATIONS PRESENT. - SEE COMMENT. Microscopic Comment Immunohistochemical stains are performed after review of the H&E stained sections for cytokeratin AE1/AE3, calponin, smooth muscle myosin, p63 and E-Cadherin. The morphology coupled with the staining pattern is consistent with the above diagnosis. Although definitive grading of breast carcinoma is best done on excision, the features of the invasive tumor from the left breast 2 o'clock biopsy are compatible with a grade 1 breast carcinoma. Breast prognostic markers will be performed and reported in an addendum. Findings are called to the [facility] on 01/19/2017. Dr. Vicente Males has seen this case in consultation with agreement. (RH:kh 01-19-17)  PROGNOSTIC INDICATORS Estrogen Receptor: 90%, POSITIVE, STRONG STAINING INTENSITY Progesterone Receptor: 90%,  POSITIVE, STRONG STAINING INTENSITY Proliferation Marker Ki67: 1% HER2 - NEGATIVE  RADIOGRAPHIC STUDIES: I have personally reviewed the radiological images as listed and agreed with the findings in the report. No results found.  ASSESSMENT & PLAN: 61 y.o. postmenopausal woman, presented with screening discovered left breast cancer.  1. Malignant neoplasm of upper-outer quadrant of left breast, invasive lobular carcinoma, grade 1, pT2N0M0, ER+/PR+/HER2-, Oncotype RS low risk ---She underwent left breast lumpectomy and sentinel lymph node biopsy. -I discussed her surgical pathology results with her. She had a computed surgical resection, sentinel lymph nodes were negative. -I discussed her Oncotype test results, which showed a low risk, Her estimated 10 year risk of distant recurrence is 8% with tamoxifen. Adjuvant chemotherapy is not recommended in low risk disease. -Giving the strong ER and PR expression in her postmenopausal status, I recommend adjuvant endocrine therapy with aromatase inhibitor for a total of 5-10 years to reduce the risk of cancer recurrence.  -The potential benefit and side effects of AI, which includes but not limited to, hot flash, skin and vaginal dryness, metabolic changes ( increased blood glucose, cholesterol, weight, etc.), slightly in increased risk of cardiovascular disease, cataracts, muscular and joint discomfort, osteopenia and osteoporosis, etc, were discussed with her in great details. She is interested, we'll start next week. -I cautery and tamoxifen to her pharmacy today. -We previously discussed the breast cancer surveillance after her surgery. She will continue annual screening mammogram, self exam, and a routine office visit with lab and exam with [place]. -I encouraged her to have healthy diet and exercise regularly. - survivorship apt with [facility] Comment in 2 months - f/u in 4 months -We'll obtain a bone density scan in the next month  2. Hypertension and  hypercholesterolemia -She'll follow-up with her primary care physician  3. Arthritis -f/u with PCP   Plan: - DEXA scan within next month - prescribe Letrozole, she will start in the next few days. - f/u in 4 months. She will come in to the survivorship clinic in 2 months.  All questions were  answered. The patient knows to call the clinic with any problems, questions or concerns. I spent 25 minutes counseling the patient face to face. The total time spent in the appointment was 30 minutes and more than 50% was on counseling.   Truitt Merle, MD 06/25/2017

## 2017-06-25 ENCOUNTER — Telehealth: Payer: Self-pay | Admitting: Hematology

## 2017-06-25 ENCOUNTER — Ambulatory Visit (HOSPITAL_BASED_OUTPATIENT_CLINIC_OR_DEPARTMENT_OTHER): Payer: Commercial Managed Care - PPO | Admitting: Hematology

## 2017-06-25 VITALS — BP 141/78 | HR 73 | Temp 97.0°F | Resp 20 | Ht 66.0 in | Wt 212.3 lb

## 2017-06-25 DIAGNOSIS — Z170 Estrogen receptor positive status [ER+]: Secondary | ICD-10-CM | POA: Diagnosis not present

## 2017-06-25 DIAGNOSIS — I10 Essential (primary) hypertension: Secondary | ICD-10-CM | POA: Diagnosis not present

## 2017-06-25 DIAGNOSIS — C50412 Malignant neoplasm of upper-outer quadrant of left female breast: Secondary | ICD-10-CM | POA: Diagnosis not present

## 2017-06-25 MED ORDER — LETROZOLE 2.5 MG PO TABS: 2.5000 mg | ORAL_TABLET | Freq: Every day | ORAL | 2 refills | Status: DC

## 2017-06-25 NOTE — Telephone Encounter (Signed)
Scheduled appt per 7/5 los - Gave patient AVS and calender per los.  

## 2017-06-27 ENCOUNTER — Encounter: Payer: Self-pay | Admitting: Hematology

## 2017-07-13 ENCOUNTER — Ambulatory Visit
Admission: RE | Admit: 2017-07-13 | Discharge: 2017-07-13 | Disposition: A | Discharge status: Home / Self Care | Payer: Commercial Managed Care - PPO | Source: Ambulatory Visit | Attending: Radiation Oncology | Admitting: Radiation Oncology

## 2017-07-13 ENCOUNTER — Encounter: Payer: Self-pay | Admitting: Radiation Oncology

## 2017-07-13 VITALS — BP 136/72 | HR 67 | Temp 97.9°F | Resp 18 | Wt 214.4 lb

## 2017-07-13 DIAGNOSIS — C50912 Malignant neoplasm of unspecified site of left female breast: Secondary | ICD-10-CM | POA: Diagnosis not present

## 2017-07-13 DIAGNOSIS — C50412 Malignant neoplasm of upper-outer quadrant of left female breast: Secondary | ICD-10-CM

## 2017-07-13 DIAGNOSIS — Z170 Estrogen receptor positive status [ER+]: Secondary | ICD-10-CM | POA: Diagnosis not present

## 2017-07-13 DIAGNOSIS — Z510 Encounter for antineoplastic radiation therapy: Secondary | ICD-10-CM | POA: Diagnosis not present

## 2017-07-13 NOTE — Progress Notes (Signed)
Radiation Oncology         [phone] ________________________________  Name: JALIZA SEIFRIED MRN: [id number]  Date: 07/13/2017  DOB: 10-30-1956  Post Treatment Note  CC: Hoyt Koch, MD  Rolm Bookbinder, MD  Diagnosis:   Stage IIA (pT2, pN0), ER/PR positive,grade 1 invasive locular carcinoma of the left breast     Interval Since Last Radiation:  4 weeks   04/21/2017 to 06/09/2017: 1. The Left breast was treated to 50.4 Gy in 28 fractions at 1.8 Gy per fraction. 2. The Left breast was boosted to 14 Gy in 7 fractions at 2 Gy per fraction.  Narrative:  The patient returns today for routine follow-up. During treatment she did very well with radiotherapy and did not have significant desquamation.                             On review of systems, the patient states she's doing great. She denies any skin changes or edema. No other complaints are noted.  ALLERGIES:  has No Known Allergies.  Meds: Current Outpatient Prescriptions  Medication Sig Dispense Refill  . acetaminophen (TYLENOL) 325 MG tablet Take 650 mg by mouth every 6 (six) hours as needed for mild pain.    . clobetasol cream (TEMOVATE) 3.71 % Apply 1 application topically 2 (two) times daily as needed (itching).     . hydrochlorothiazide (HYDRODIURIL) 25 MG tablet TAKE 1 TABLET BY MOUTH EVERY DAY 30 tablet 3  . letrozole (FEMARA) 2.5 MG tablet Take 1 tablet (2.5 mg total) by mouth daily. 30 tablet 2  . levothyroxine (SYNTHROID, LEVOTHROID) 88 MCG tablet Take 88 mcg by mouth daily. Take 2 tabs for 2 days  Alternate with 44 mcg for 1 day, then so forth.  12  . metoprolol succinate (TOPROL-XL) 25 MG 24 hr tablet TAKE 1 TABLET BY MOUTH EVERY DAY (Patient taking differently: TAKE 1/2 TABLET BY MOUTH EVERY DAY) 90 tablet 3  . pantoprazole (PROTONIX) 40 MG tablet Take 1 tablet (40 mg total) by mouth daily. Pt. needs to set up follow up appt. 30 tablet 0  . simvastatin (ZOCOR) 20 MG tablet Take 20 mg by mouth daily.     No  current facility-administered medications for this encounter.     Physical Findings:  weight is 214 lb 6.4 oz (97.3 kg). Her oral temperature is 97.9 F (36.6 C). Her blood pressure is 136/72 and her pulse is 67. Her respiration is 18 and oxygen saturation is 100%.  Pain Assessment Pain Score: 0-No pain/10 In general this is a well appearing caucasian female in no acute distress. She's alert and oriented x4 and appropriate throughout the examination. Cardiopulmonary assessment is negative for acute distress and she exhibits normal effort. The left breast was examined and reveals no hyperpigmentation or desquamation. No chest wall edema is present.   Lab Findings: Lab Results  Component Value Date   WBC 6.9 01/28/2017   HGB 13.7 01/28/2017   HCT 42.3 01/28/2017   MCV 87.9 01/28/2017   PLT 342 01/28/2017     Radiographic Findings: No results found.  Impression/Plan: 1.  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 The patient has been doing well since completion of radiotherapy. We discussed that we would be happy to continue to follow her as needed, but she will also continue to follow up with Dr. Burr Medico in medical oncology and continue  her antiestrogen therapy. She was counseled on skin care as well as measures to avoid sun exposure to this area. She is also getting fitted for a sleeve in case she develops lymphedema. 2. Survivorship. The patient will be seen in survivorship clinic in September and was counseled on the importance of keeping this appointment     Carola Rhine, PAC

## 2017-07-22 ENCOUNTER — Other Ambulatory Visit: Payer: Self-pay | Admitting: Hematology

## 2017-07-22 DIAGNOSIS — Z760 Encounter for issue of repeat prescription: Secondary | ICD-10-CM | POA: Diagnosis not present

## 2017-07-22 DIAGNOSIS — E2839 Other primary ovarian failure: Secondary | ICD-10-CM

## 2017-07-28 DIAGNOSIS — C50412 Malignant neoplasm of upper-outer quadrant of left female breast: Secondary | ICD-10-CM | POA: Diagnosis not present

## 2017-07-29 ENCOUNTER — Ambulatory Visit
Admission: RE | Admit: 2017-07-29 | Discharge: 2017-07-29 | Disposition: A | Discharge status: Home / Self Care | Payer: Commercial Managed Care - PPO | Source: Ambulatory Visit | Attending: Hematology | Admitting: Hematology

## 2017-07-29 DIAGNOSIS — E2839 Other primary ovarian failure: Secondary | ICD-10-CM

## 2017-07-29 DIAGNOSIS — Z780 Asymptomatic menopausal state: Secondary | ICD-10-CM | POA: Diagnosis not present

## 2017-07-29 DIAGNOSIS — M85851 Other specified disorders of bone density and structure, right thigh: Secondary | ICD-10-CM | POA: Diagnosis not present

## 2017-08-10 DIAGNOSIS — Z853 Personal history of malignant neoplasm of breast: Secondary | ICD-10-CM | POA: Diagnosis not present

## 2017-08-10 DIAGNOSIS — C50912 Malignant neoplasm of unspecified site of left female breast: Secondary | ICD-10-CM | POA: Diagnosis not present

## 2017-08-19 ENCOUNTER — Other Ambulatory Visit: Payer: Self-pay | Admitting: *Deleted

## 2017-08-19 DIAGNOSIS — C50412 Malignant neoplasm of upper-outer quadrant of left female breast: Secondary | ICD-10-CM

## 2017-08-19 DIAGNOSIS — Z170 Estrogen receptor positive status [ER+]: Principal | ICD-10-CM

## 2017-08-19 MED ORDER — LETROZOLE 2.5 MG PO TABS: 2.5000 mg | ORAL_TABLET | Freq: Every day | ORAL | 2 refills | Status: DC

## 2017-08-21 ENCOUNTER — Other Ambulatory Visit: Payer: Self-pay | Admitting: Hematology

## 2017-08-21 DIAGNOSIS — C50412 Malignant neoplasm of upper-outer quadrant of left female breast: Secondary | ICD-10-CM

## 2017-08-21 DIAGNOSIS — Z170 Estrogen receptor positive status [ER+]: Principal | ICD-10-CM

## 2017-09-01 ENCOUNTER — Telehealth: Payer: Self-pay

## 2017-09-01 NOTE — Telephone Encounter (Signed)
Spoke with patient about upcoming visit SCP visit on 9/13.  She confirmed she would be here.

## 2017-09-03 ENCOUNTER — Ambulatory Visit (HOSPITAL_BASED_OUTPATIENT_CLINIC_OR_DEPARTMENT_OTHER): Payer: Commercial Managed Care - PPO | Admitting: Adult Health

## 2017-09-03 ENCOUNTER — Encounter: Payer: Self-pay | Admitting: Adult Health

## 2017-09-03 VITALS — BP 112/55 | HR 69 | Temp 98.1°F | Resp 20 | Ht 66.0 in | Wt 215.6 lb

## 2017-09-03 DIAGNOSIS — C50412 Malignant neoplasm of upper-outer quadrant of left female breast: Secondary | ICD-10-CM

## 2017-09-03 DIAGNOSIS — Z79811 Long term (current) use of aromatase inhibitors: Secondary | ICD-10-CM | POA: Diagnosis not present

## 2017-09-03 DIAGNOSIS — Z170 Estrogen receptor positive status [ER+]: Secondary | ICD-10-CM

## 2017-09-03 NOTE — Progress Notes (Signed)
CLINIC:  Survivorship   REASON FOR VISIT:  Routine follow-up post-treatment for a recent history of breast cancer.  BRIEF ONCOLOGIC HISTORY:  Oncology History   Cancer Staging Malignant neoplasm of upper-outer quadrant of left breast in female, estrogen receptor positive ([place]) Staging form: Breast, AJCC 8th Edition - Clinical stage from 01/15/2017: Stage IA (cT1c, cN0, cM0, G1, ER: Positive, PR: Positive, HER2: Negative) - Signed by Truitt Merle, MD on 01/27/2017      Malignant neoplasm of upper-outer quadrant of left breast in female, estrogen receptor positive ([place])   01/15/2017 Initial Biopsy    Left breast 2:00 core needle biopsy showed invasive lobular carcinoma, atypical lobular hyperplasia. Grade 1.      01/15/2017 Receptors her2    ER 90% positive, PR 90% positive, HER-2 negative, Ki-67 1%      01/15/2017 Mammogram    Bilateral diagnostic mammogram and ultrasound showed irregular hypoechoic mass in the left breast 2:00 position, 9 cm from the nipple, measuring 1.2 x 1.0 x 0.8 cm, no evidence of malignancy within the right breast, ultrasound of the left axilla was negative. Breast density category B      01/15/2017 Initial Diagnosis    Malignant neoplasm of upper-outer quadrant of left breast in female, estrogen receptor positive ([place])      02/16/2017 Pathology Results    Diagnosis 1. Breast, lumpectomy, Left - INVASIVE GRADE I LOBULAR CARCINOMA. - TUMOR IS ESTIMATED TO SPAN AT LEAST 2.1 CM IN GREATEST DIMENSION. 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 - INVASIVE TUMOR IS FOCALLY LESS THAN 0.1 CM TO LATERAL MARGIN BUT INKED MARGINAL SURFACE IS NEGATIVE FOR TUMOR. - INITIAL ANTERIOR MARGIN CANNOT BE EFFECTIVELY CLEARED OF INVASIVE TUMOR, PLEASE SEE SPECIMEN #10 FOR FINAL MARGIN STATUS. - SEE ONCOLOGY TEMPLATE. 2. 4 left axillary sentinel lymph nodes were negative 3. Additional resection of left posterior, superior, anterior and inferior  margins were benign.       02/16/2017 Surgery    breast lumpectomy with radioactive seed and axillary sentinel lymph node biposy with Dr. Donne [facility]      02/16/2017 Oncotype testing    Testing resulted in reccurance score of 12.      02/16/2017 Pathology Results    Left breast lumpectomy showed a 2.1 cm invasive grade 1 lobular carcinoma, associated LCIS and atypical lobular hyperplasia. Surgical margins were negative. 4 sentinel lymph nodes were negative.      02/16/2017 Oncotype testing    RS 12, low risk, predicts 10 year risk of distant recurrence 8% with tamoxifen, adjuvant chemotherapy was not recommended.      03/09/2017 Imaging     IMPRESSION: Large complex fluid collection extending throughout the upper left breast, 3:00 to 9:00 axes, at posterior depth, presumed hematoma, possibly multiloculated.      03/18/2017 Surgery     Evacuation seroma left bresat with Dr. Donne [facility]el       04/21/2017 - 06/08/2017 Radiation Therapy    Patient received radiation treatment with Dr. Lisbeth Renshaw      06/2017 -  Anti-estrogen oral therapy    Letrozole daily       INTERVAL HISTORY:  Ms. Kalis presents to the [facility] today for our initial meeting to review her survivorship care plan detailing her treatment course for breast cancer, as well as monitoring long-term side effects of that treatment, education regarding health maintenance, screening, and overall wellness and health promotion.     Overall, Ms. Culbertson reports feeling quite well.  She is  taking Letrozole daily.  She is having some mild arthralgias, and challenges with sleeping.  She has hot flashes, they are slightly better than when she was peri-menopausal.  She has had some weight gain as well.      REVIEW OF SYSTEMS:  Review of Systems  Constitutional: Negative for appetite change, chills, fatigue, fever and unexpected weight change.  HENT:   Negative for hearing loss and lump/mass.   Eyes: Negative for eye problems and  icterus.  Respiratory: Negative for chest tightness, cough and shortness of breath.   Cardiovascular: Negative for chest pain, leg swelling and palpitations.  Gastrointestinal: Negative for abdominal distention, abdominal pain, constipation and diarrhea.  Endocrine: Negative for hot flashes.  Genitourinary: Negative for difficulty urinating.   Musculoskeletal: Positive for arthralgias.  Skin: Negative for itching and rash.  Neurological: Negative for dizziness, extremity weakness, headaches and numbness.  Hematological: Negative for adenopathy. Does not bruise/bleed easily.  Psychiatric/Behavioral: Positive for sleep disturbance. Negative for depression. The patient is not nervous/anxious.   Breast: Denies any new nodularity, masses, tenderness, nipple changes, or nipple discharge.      ONCOLOGY TREATMENT TEAM:  1. Surgeon:  Dr. Donne [facility]el at [facility] 2. Medical Oncologist: Dr. Burr Medico  3. Radiation Oncologist: Dr. Lisbeth Renshaw    PAST MEDICAL/SURGICAL HISTORY:  Past Medical History:  Diagnosis Date  . Arthritis   . Breast cancer ([place]) 01/15/2017   left breast  . Cancer ([place])   . GERD (gastroesophageal reflux disease)   . HLD (hyperlipidemia)   . Hypertension   . Hypothyroidism   . Obesity   . Overactive bladder   . Palpitation   . Thyroid disease    Past Surgical History:  Procedure Laterality Date  . bladder stem stretch  1966  . BREAST LUMPECTOMY WITH RADIOACTIVE SEED AND SENTINEL LYMPH NODE BIOPSY Left 02/16/2017   Procedure: BREAST LUMPECTOMY WITH RADIOACTIVE SEED AND AXILLARY SENTINEL LYMPH NODE BIOPSY;  Surgeon: Rolm Bookbinder, MD;  Location: [facility];  Service: General;  Laterality: Left;  . EVACUATION BREAST HEMATOMA Left 03/18/2017   Procedure: EVACUATION SEROMA LEFT BREAST;  Surgeon: Rolm Bookbinder, MD;  Location: [facility];  Service: General;  Laterality: Left;  . JOINT REPLACEMENT    . KNEE ARTHROSCOPY     LEFT  .  NOSE SURGERY  1980  . SHOULDER SURGERY Left 2007  . TONSILLECTOMY  1964  . TOTAL HIP ARTHROPLASTY Left 10/23/2015   Procedure: TOTAL HIP ARTHROPLASTY ANTERIOR APPROACH;  Surgeon: Melrose Nakayama, MD;  Location: [facility];  Service: Orthopedics;  Laterality: Left;     ALLERGIES:  No Known Allergies   CURRENT MEDICATIONS:  Outpatient Encounter Prescriptions as of 09/03/2017  Medication Sig  . acetaminophen (TYLENOL) 325 MG tablet Take 650 mg by mouth every 6 (six) hours as needed for mild pain.  . clobetasol cream (TEMOVATE) 6.21 % Apply 1 application topically 2 (two) times daily as needed (itching).   . hydrochlorothiazide (HYDRODIURIL) 25 MG tablet TAKE 1 TABLET BY MOUTH EVERY DAY  . letrozole (FEMARA) 2.5 MG tablet TAKE 1 TABLET BY MOUTH  DAILY  . levothyroxine (SYNTHROID, LEVOTHROID) 88 MCG tablet Take 88 mcg by mouth daily. Take 2 tabs for 2 days  Alternate with 44 mcg for 1 day, then so forth.  . metoprolol succinate (TOPROL-XL) 25 MG 24 hr tablet TAKE 1 TABLET BY MOUTH EVERY DAY (Patient taking differently: TAKE 1/2 TABLET BY MOUTH EVERY DAY)  . pantoprazole (PROTONIX) 40 MG tablet Take 1 tablet (  40 mg total) by mouth daily. Pt. needs to set up follow up appt.  . simvastatin (ZOCOR) 20 MG tablet Take 20 mg by mouth daily.   No facility-administered encounter medications on file as of 09/03/2017.      ONCOLOGIC FAMILY HISTORY:  Family History  Problem Relation Age of Onset  . Heart Problems Father        cardiac arrest  . Heart attack Mother   . Heart disease Mother   . Cancer Maternal Uncle 41       colon cancer   . Cancer Maternal Grandmother        bladder cancer      GENETIC COUNSELING/TESTING: Not at this time  SOCIAL HISTORY:  Tascha L Woods is divorced and lives with her 40 year old daughter in [place], [place].  She has a 33 year old son who lives in [place].  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  She denies any current  or history of tobacco, alcohol, or illicit drug use.     PHYSICAL EXAMINATION:  Vital Signs:   Vitals:   09/03/17 1353  BP: (!) 112/55  Pulse: 69  Resp: 20  Temp: 98.1 F (36.7 C)  SpO2: 99%   Filed Weights   09/03/17 1353  Weight: 215 lb 9.6 oz (97.8 kg)   General: Well-nourished, well-appearing female in no acute distress.  She is unaccompanied today.   HEENT: Head is normocephalic.  Pupils equal and reactive to light. Conjunctivae clear without exudate.  Sclerae anicteric. Oral mucosa is pink, moist.  Oropharynx is pink without lesions or erythema.  Lymph: No cervical, supraclavicular, or infraclavicular lymphadenopathy noted on palpation.  Cardiovascular: Regular rate and rhythm.Marland Kitchen Respiratory: Clear to auscultation bilaterally. Chest expansion symmetric; breathing non-labored.  Breasts: right breast without nodules, masses, skin or nipple changes, left breast s/p lumpectomy and radiation, skin is thickened with radiation changes and slightly tender, no masses, nodules, further skin/nipple changes, benign bilateral breast exam GI: Abdomen soft and round; non-tender, non-distended. Bowel sounds normoactive.  GU: Deferred.  Neuro: No focal deficits. Steady gait.  Psych: Mood and affect normal and appropriate for situation.  Extremities: No edema. MSK: No focal spinal tenderness to palpation.  Full range of motion in bilateral upper extremities Skin: Warm and dry.  LABORATORY DATA:  None for this visit.  DIAGNOSTIC IMAGING:  None for this visit.      ASSESSMENT AND PLAN:  Ms.. Chimenti is a pleasant 61 y.o. female with Stage IA left breast invasive lobular carcinoma, ER+/PR+/HER2-, diagnosed in 12/2016, treated with lumpectomy, adjuvant radiation therapy, and anti-estrogen therapy with Letrozole beginning in 06/2017.  She presents to the Survivorship Clinic for our initial meeting and routine follow-up post-completion of treatment for breast cancer.    1. Stage IA left breast  cancer:  Ms. Tague is continuing to recover from definitive treatment for breast cancer. She will follow-up with her medical oncologist, Dr. Burr Medico in 11/2017 with history and physical exam per surveillance protocol.  She will continue her anti-estrogen therapy with Letrozole. Thus far, she is tolerating the Letrozole well, with minimal side effects (see #2).  Today, a comprehensive survivorship care plan and treatment summary was reviewed with the patient today detailing her breast cancer diagnosis, treatment course, potential late/long-term effects of treatment, appropriate follow-up care with recommendations for the future, and patient education resources.  A copy of this summary, along with a letter will be sent to the patient's  primary care provider via mail/fax/In Basket message after today's visit.    2. Arthralgias and difficulty sleeping: This could possibly due to the Letrozole.  I recommended that she try taking the Letrozole at night and see if it helps.  We reviewed exercise/yoga to help alleviate her arthralgias,  She can continue taking Tylenol if needed as well.    3. Bone health:  Given Ms. Ems's age/history of breast cancer and her current treatment regimen including anti-estrogen therapy with Letrozole, she is at risk for bone demineralization.  Her last DEXA scan was 07/29/2017, which showed t score of -1.1 in the right femur consistent with osteopenia.  She was encouraged to increase her consumption of foods rich in calcium, as well as increase her weight-bearing activities.  She was given education on specific activities to promote bone health.  4. Cancer screening:  Due to Ms. Dragos's history and her age, she should receive screening for skin cancers, colon cancer, and gynecologic cancers.  The information and recommendations are listed on the patient's comprehensive care plan/treatment summary and were reviewed in detail with the patient.    5. Health maintenance and wellness promotion: Ms.  Broyles was encouraged to consume 5-7 servings of fruits and vegetables per day. We reviewed the "Nutrition Rainbow" handout, as well as the handout "Take Control of Your Health and Reduce Your Cancer Risk" from the [facility].  She was also encouraged to engage in moderate to vigorous exercise for 30 minutes per day most days of the week. We discussed the [employer] fitness program, which is designed for cancer survivors to help them become more physically fit after cancer treatments.  She was instructed to limit her alcohol consumption and continue to abstain from tobacco use.     6. Support services/counseling: It is not uncommon for this period of the patient's cancer care trajectory to be one of many emotions and stressors.  We discussed an opportunity for her to participate in the next session of [facility] ("Finding Your New Normal") support group series designed for patients after they have completed treatment.   Ms. Winton was encouraged to take advantage of our many other support services programs, support groups, and/or counseling in coping with her new life as a cancer survivor after completing anti-cancer treatment.  She was offered support today through active listening and expressive supportive counseling.  She was given information regarding our available services and encouraged to contact me with any questions or for help enrolling in any of our support group/programs.    Dispo:   -Return to cancer center for follow up with Dr. Burr Medico in 11/2017 -Mammogram due in 12/2017 -Follow up with Dr Donne [facility] at [facility] in April, 2019 -Bone density due in 07/2019 -She is welcome to return back to the Survivorship Clinic at any time; no additional follow-up needed at this time.  -Consider referral back to survivorship as a long-term survivor for continued surveillance  A total of (30) minutes of face-to-face time was spent with this patient with greater than 50% of that time in  counseling and care-coordination.   Gardenia Phlegm, [place] [phone]   Note: PRIMARY CARE PROVIDER Hoyt Koch, [place] [phone]

## 2017-09-04 DIAGNOSIS — Z86010 Personal history of colonic polyps: Secondary | ICD-10-CM | POA: Diagnosis not present

## 2017-09-04 DIAGNOSIS — I10 Essential (primary) hypertension: Secondary | ICD-10-CM | POA: Diagnosis not present

## 2017-09-04 DIAGNOSIS — C50912 Malignant neoplasm of unspecified site of left female breast: Secondary | ICD-10-CM | POA: Diagnosis not present

## 2017-09-21 DIAGNOSIS — C50912 Malignant neoplasm of unspecified site of left female breast: Secondary | ICD-10-CM | POA: Diagnosis not present

## 2017-09-21 DIAGNOSIS — Z853 Personal history of malignant neoplasm of breast: Secondary | ICD-10-CM | POA: Diagnosis not present

## 2017-09-30 DIAGNOSIS — C50912 Malignant neoplasm of unspecified site of left female breast: Secondary | ICD-10-CM | POA: Diagnosis not present

## 2017-10-15 DIAGNOSIS — Z86010 Personal history of colonic polyps: Secondary | ICD-10-CM | POA: Diagnosis not present

## 2017-10-15 DIAGNOSIS — K640 First degree hemorrhoids: Secondary | ICD-10-CM | POA: Diagnosis not present

## 2017-10-15 LAB — HM COLONOSCOPY

## 2017-10-26 ENCOUNTER — Ambulatory Visit: Payer: Commercial Managed Care - PPO | Admitting: Hematology

## 2017-10-26 ENCOUNTER — Other Ambulatory Visit: Payer: Commercial Managed Care - PPO

## 2017-11-09 ENCOUNTER — Telehealth: Payer: Self-pay | Admitting: *Deleted

## 2017-11-09 NOTE — Telephone Encounter (Signed)
Received faxed report of colonoscopy done 10/15/17 from Dr. Alycia Rossetti at [facility].  Left results on Dr. Ernestina Penna desk for review.

## 2017-11-10 ENCOUNTER — Encounter: Payer: Self-pay | Admitting: Internal Medicine

## 2017-11-23 NOTE — Progress Notes (Signed)
[place]  Telephone:([phone]) [phone] [phone]  Clinic Follow Up Note   Patient Care Team: Hoyt Koch, MD as PCP - General (Internal Medicine) Delice Bison, Charlestine Massed, NP as Nurse Practitioner (Hematology and Oncology) Truitt Merle, MD as Consulting Physician (Hematology) Kyung Rudd, MD as Consulting Physician (Radiation Oncology) Rolm Bookbinder, MD as Consulting Physician (General Surgery) 11/25/2017  CHIEF COMPLAINTS:  Follow up left breast cancer  Oncology History   Cancer Staging Malignant neoplasm of upper-outer quadrant of left breast in female, estrogen receptor positive ([place]) Staging form: Breast, AJCC 8th Edition - Clinical stage from 01/15/2017: Stage IA (cT1c, cN0, cM0, G1, ER: Positive, PR: Positive, HER2: Negative) - Signed by Truitt Merle, MD on 01/27/2017      Malignant neoplasm of upper-outer quadrant of left breast in female, estrogen receptor positive ([place])   01/15/2017 Initial Biopsy    Left breast 2:00 core needle biopsy showed invasive lobular carcinoma, atypical lobular hyperplasia. Grade 1.      01/15/2017 Receptors her2    ER 90% positive, PR 90% positive, HER-2 negative, Ki-67 1%      01/15/2017 Mammogram    Bilateral diagnostic mammogram and ultrasound showed irregular hypoechoic mass in the left breast 2:00 position, 9 cm from the nipple, measuring 1.2 x 1.0 x 0.8 cm, no evidence of malignancy within the right breast, ultrasound of the left axilla was negative. Breast density category B      01/15/2017 Initial Diagnosis    Malignant neoplasm of upper-outer quadrant of left breast in female, estrogen receptor positive ([place])      02/16/2017 Pathology Results    Diagnosis 1. Breast, lumpectomy, Left - INVASIVE GRADE I LOBULAR CARCINOMA. - TUMOR IS ESTIMATED TO SPAN AT LEAST 2.1 CM IN GREATEST DIMENSION. 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 - INVASIVE TUMOR IS FOCALLY LESS THAN 0.1 CM  TO LATERAL MARGIN BUT INKED MARGINAL SURFACE IS NEGATIVE FOR TUMOR. - INITIAL ANTERIOR MARGIN CANNOT BE EFFECTIVELY CLEARED OF INVASIVE TUMOR, PLEASE SEE SPECIMEN #10 FOR FINAL MARGIN STATUS. - SEE ONCOLOGY TEMPLATE. 2. 4 left axillary sentinel lymph nodes were negative 3. Additional resection of left posterior, superior, anterior and inferior margins were benign.       02/16/2017 Surgery    breast lumpectomy with radioactive seed and axillary sentinel lymph node biposy with Dr. Donne Hazel      02/16/2017 Oncotype testing    Testing resulted in reccurance score of 12.      02/16/2017 Pathology Results    Left breast lumpectomy showed a 2.1 cm invasive grade 1 lobular carcinoma, associated LCIS and atypical lobular hyperplasia. Surgical margins were negative. 4 sentinel lymph nodes were negative.      02/16/2017 Oncotype testing    RS 12, low risk, predicts 10 year risk of distant recurrence 8% with tamoxifen, adjuvant chemotherapy was not recommended.      03/09/2017 Imaging     IMPRESSION: Large complex fluid collection extending throughout the upper left breast, 3:00 to 9:00 axes, at posterior depth, presumed hematoma, possibly multiloculated.      03/18/2017 Surgery     Evacuation seroma left bresat with Dr. Donne Hazel       04/21/2017 - 06/08/2017 Radiation Therapy    Patient received radiation treatment with Dr. Lisbeth Renshaw      06/2017 -  Anti-estrogen oral therapy    Letrozole daily      09/03/2017 Survivorship    Survivorship Clinic with NP  HISTORY OF PRESENTING ILLNESS:  [name] 61 y.o. female is here because of recently diagnosed left breast invasive lobular carcinoma. She presents to our multidisciplinary breast clinic today.   Her cancer was discovered by screening mammogram. She has no palpable breast mass or adenopathy. She feels well, no pain or other complain. She has mild left shoulder pain since 2007. She had left hip replacement in 2016   The  patient underwent bilateral diagnostic mammogram and bilateral breast ultrasound on 01/15/2017 to evaluate areas of possible architectural distortion within each breast. These scans sowed an irregular hypoechoic mass in the left breast at the 2 o'clock axis, 9 cm from the nipple, measuring 1.2 x 0.8 x 1 cm, a probable correlate for the architectural distortion seen on mammogram. There was no evidence of malignancy within the right breast.   Biopsy of the left breast at the 2:00 o'clock position on 01/15/2017 revealed invasive lobular carcinoma, atypical lobular hyperplasia, and microcalcification present. Pathology compatible with grade 1 breast carcinoma, ER/PR positive, HER-2 negative, Ki67 1%.  GYN HISTORY  Menarchal: 14 LMP: age of 37 Contraceptive: 34 years  HRT: no G1P1: one son, at age of 89   CURRENT THERAPY: Letrozole 2.74m daily starting 06/2017. Due to significant joint pain she switched to Exemestane starting 11/2017.   INTERIM HISTORY:  She presents to the clinic today for follow up. She presents to the clinic today noting she started letrozole in 06/2017 and has experienced headaches (6/10) for which she takes medication for. She also has joint pain in her hands (9/10). The pain has woke her up in the night. The pain is up and down. She would like to switch to another AI if possible. She notes to still having soreness in breast, she feels some pain when giving hugs. She notes a spot on her left forearm that may itch. Her appetite has increased with letrozole and she gained weight. She has PT after surgery. She does regular self breast exams.     MEDICAL HISTORY:  Past Medical History:  Diagnosis Date  . Arthritis   . Breast cancer ([facility] 01/15/2017   left breast  . Cancer ([facility]   . GERD (gastroesophageal reflux disease)   . HLD (hyperlipidemia)   . Hypertension   . Hypothyroidism   . Obesity   . Overactive bladder   . Palpitation   . Thyroid disease     SURGICAL  HISTORY: Past Surgical History:  Procedure Laterality Date  . bladder stem stretch  1966  . BREAST LUMPECTOMY WITH RADIOACTIVE SEED AND SENTINEL LYMPH NODE BIOPSY Left 02/16/2017   Procedure: BREAST LUMPECTOMY WITH RADIOACTIVE SEED AND AXILLARY SENTINEL LYMPH NODE BIOPSY;  Surgeon: MRolm Bookbinder MD;  Location[facility]  Service: General;  Laterality: Left;  . EVACUATION BREAST HEMATOMA Left 03/18/2017   Procedure: EVACUATION SEROMA LEFT BREAST;  Surgeon: MRolm Bookbinder MD;  [facility]  Service: General;  Laterality: Left;  . JOINT REPLACEMENT    . KNEE ARTHROSCOPY     LEFT  . NOSE SURGERY  1980  . SHOULDER SURGERY Left 2007  . TONSILLECTOMY  1964  . TOTAL HIP ARTHROPLASTY Left 10/23/2015   Procedure: TOTAL HIP ARTHROPLASTY ANTERIOR APPROACH;  Surgeon: PMelrose Nakayama MD;  Location: [facility]  Service: Orthopedics;  Laterality: Left;    SOCIAL HISTORY: Social History   Socioeconomic History  . Marital status: Single    Spouse name: Not on file  . Number of children: Not on  file  . Years of education: Not on file  . Highest education level: Not on file  Social Needs  . Financial resource strain: Not on file  . Food insecurity - worry: Not on file  . Food insecurity - inability: Not on file  . Transportation needs - medical: Not on file  . Transportation needs - non-medical: Not on file  Occupational History  . Not on file  Tobacco Use  . Smoking status: Never Smoker  . Smokeless tobacco: Never Used  Substance and Sexual Activity  . Alcohol use: No  . Drug use: No  . Sexual activity: Not on file  Other Topics Concern  . Not on file  Social History Narrative  . Not on file    FAMILY HISTORY: Family History  Problem Relation Age of Onset  . Heart Problems Father        cardiac arrest  . Heart attack Mother   . Heart disease Mother   . Cancer Maternal Uncle 12       colon cancer   . Cancer Maternal Grandmother         bladder cancer     ALLERGIES:  has No Known Allergies.  MEDICATIONS:  Current Outpatient Medications  Medication Sig Dispense Refill  . acetaminophen (TYLENOL) 325 MG tablet Take 650 mg by mouth every 6 (six) hours as needed for mild pain.    . clobetasol cream (TEMOVATE) 8.75 % Apply 1 application topically 2 (two) times daily as needed (itching).     Marland Kitchen exemestane (AROMASIN) 25 MG tablet Take 1 tablet (25 mg total) by mouth daily after breakfast. 90 tablet 1  . hydrochlorothiazide (HYDRODIURIL) 25 MG tablet TAKE 1 TABLET BY MOUTH EVERY DAY 30 tablet 3  . letrozole (FEMARA) 2.5 MG tablet TAKE 1 TABLET BY MOUTH  DAILY 90 tablet 1  . levothyroxine (SYNTHROID, LEVOTHROID) 88 MCG tablet Take 88 mcg by mouth daily. Take 2 tabs for 2 days  Alternate with 44 mcg for 1 day, then so forth.  12  . metoprolol succinate (TOPROL-XL) 25 MG 24 hr tablet TAKE 1 TABLET BY MOUTH EVERY DAY (Patient taking differently: TAKE 1/2 TABLET BY MOUTH EVERY DAY) 90 tablet 3  . pantoprazole (PROTONIX) 40 MG tablet Take 1 tablet (40 mg total) by mouth daily. Pt. needs to set up follow up appt. 30 tablet 0  . simvastatin (ZOCOR) 20 MG tablet Take 20 mg by mouth daily.     No current facility-administered medications for this visit.     REVIEW OF SYSTEMS:   Constitutional: Denies fevers, chills or abnormal night sweats (+) headaches (6/10) (+) weight gain Eyes: Denies blurriness of vision, double vision or watery eyes Ears, nose, mouth, throat, and face: Denies mucositis or sore throat Respiratory: Denies cough, dyspnea or wheezes Cardiovascular: Denies palpitation, chest discomfort or lower extremity swelling Gastrointestinal:  Denies nausea, heartburn or change in bowel habits Skin: Denies abnormal skin rashes (+) small raised spot on left forearm, itches Lymphatics: Denies new lymphadenopathy or easy bruising MSK: (+) joint pain (9/10) Neurological:Denies numbness, tingling or new weaknesses Behavioral/Psych:  Mood is stable, no new changes  BREAST: (+) tenderness  All other systems were reviewed with the patient and are negative.  PHYSICAL EXAMINATION: ECOG PERFORMANCE STATUS: 1  Vitals:   11/25/17 1510  BP: (!) 143/78  Pulse: 74  Resp: 18  Temp: 98.4 F (36.9 C)  SpO2: 99%   Filed Weights   11/25/17 1510  Weight: 219 lb 9.6 oz (  99.6 kg)    GENERAL:alert, no distress and comfortable SKIN: skin color, texture, turgor are normal, no rashes or significant lesions EYES: normal, conjunctiva are pink and non-injected, sclera clear OROPHARYNX:no exudate, no erythema and lips, buccal mucosa, and tongue normal  NECK: supple, thyroid normal size, non-tender, without nodularity LYMPH:  no palpable lymphadenopathy in the cervical, axillary or inguinal LUNGS: clear to auscultation and percussion with normal breathing effort HEART: regular rate & rhythm and no murmurs and no lower extremity edema ABDOMEN:abdomen soft, non-tender and normal bowel sounds Musculoskeletal:no cyanosis of digits and no clubbing  PSYCH: alert & oriented x 3 with fluent speech NEURO: no focal motor/sensory deficits Breasts: Breast inspection showed them to be symmetrical with no nipple discharge. Surgical scar in the left breast has healed very well. No significant skin change from radiation. Palpation of the breasts and axilla revealed no obvious mass that I could appreciate. Mild lymphedema of left breast.    LABORATORY DATA:  I have reviewed the data as listed CBC Latest Ref Rng & Units 11/25/2017 01/28/2017 10/16/2015  WBC 3.9 - 10.3 10e3/uL 7.4 6.9 10.3  Hemoglobin 11.6 - 15.9 g/dL 12.2 13.7 14.1  Hematocrit 34.8 - 46.6 % 37.6 42.3 41.9  Platelets 145 - 400 10e3/uL 335 342 339    CMP Latest Ref Rng & Units 11/25/2017 02/11/2017 01/28/2017  Glucose 70 - 140 mg/dl 121 97 97  BUN 7.0 - 26.0 mg/dL 10.8 10 12.4  Creatinine 0.6 - 1.1 mg/dL 0.9 0.89 1.0  Sodium 136 - 145 mEq/L 139 136 140  Potassium 3.5 - 5.1 mEq/L 3.5  3.4(L) 3.2(L)  Chloride 101 - 111 mmol/L - 97(L) -  CO2 22 - 29 mEq/L 22 26 28   Calcium 8.4 - 10.4 mg/dL 9.5 9.5 10.0  Total Protein 6.4 - 8.3 g/dL 7.5 - 7.8  Total Bilirubin 0.20 - 1.20 mg/dL 0.57 - 0.68  Alkaline Phos 40 - 150 U/L 123 - 117  AST 5 - 34 U/L 15 - 17  ALT 0 - 55 U/L 16 - 17   PATHOLOGY Diagnosis 02/16/2017 1. Breast, lumpectomy, Left - INVASIVE GRADE I LOBULAR CARCINOMA. - TUMOR IS ESTIMATED TO SPAN AT LEAST 2.1 CM IN GREATEST DIMENSION. 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 - INVASIVE TUMOR IS FOCALLY LESS THAN 0.1 CM TO LATERAL MARGIN BUT INKED MARGINAL SURFACE IS NEGATIVE FOR TUMOR. - INITIAL ANTERIOR MARGIN CANNOT BE EFFECTIVELY CLEARED OF INVASIVE TUMOR, PLEASE SEE SPECIMEN #10 FOR FINAL MARGIN STATUS. - SEE ONCOLOGY TEMPLATE. 2. Lymph node, sentinel, biopsy, Left Axillary - ONE BENIGN LYMPH NODE NODE WITH NO TUMOR SEEN (0/1). - SEE COMMENT. 3. Lymph node, sentinel, biopsy, Left Axillary - ONE BENIGN LYMPH NODE NODE WITH NO TUMOR SEEN (0/1). - SEE COMMENT. 4. Lymph node, sentinel, biopsy, Left Axillary - ONE BENIGN LYMPH NODE NODE WITH NO TUMOR SEEN (0/1). - SEE COMMENT. 5. Lymph node, sentinel, biopsy, Left Axillary - ONE BENIGN LYMPH NODE NODE WITH NO TUMOR SEEN (0/1). - SEE COMMENT. 6. Breast, excision, Left additional Posterior Margin - BENIGN BREAST PARENCHYMA AND FIBROFATTY SOFT TISSUE. - NO ATYPIA OR TUMOR SEEN. 7. Breast, excision, Left additional Superior Margin - BENIGN BREAST PARENCHYMA AND FIBROFATTY SOFT TISSUE. - NO ATYPIA OR TUMOR SEEN. 8. Breast, excision, Left additional Inferior Margin - BREAST PARENCHYMA SHOWING USUAL DUCTAL HYPERPLASIA. - INCIDENTAL HYALINIZED FIBROADENOMA WITH CALCIFICATIONS. - NO ATYPIA OR TUMOR SEEN. 1 of 5 FINAL for [name], Mallory L ([id number]) Diagnosis(continued) 9. Breast, excision, Left additional  Medial Margin - BENIGN BREAST PARENCHYMA AND FIBROFATTY SOFT TISSUE. - NO  ATYPIA OR TUMOR SEEN. 10. Breast, excision, Left additional Anterior Margin - BENIGN FIBROFATTY SOFT TISSUE. - NO ATYPIA OR TUMOR SEEN. Microscopic Comment 1. BREAST, INVASIVE TUMOR Procedure: Left lumpectomy with left sentinel lymph node biopsies and additional posterior, superior, medial and anterior left margin excisions. Laterality: Left. Tumor Size: At least 2.1 cm in greatest dimension, see below comment. Histologic Type: Invasive lobular carcinoma. Grade: 1. Tubular Differentiation: 3. Nuclear Pleomorphism: 1. Mitotic Count: 1. Ductal Carcinoma in Situ (DCIS): Not identified. Extent of Tumor: Tumor confined to breast parenchyma. Skin: Not received. Nipple: Not received. Skeletal muscle: Not received. Margins: Invasive carcinoma, distance from closest margin: Invasive lobular carcinoma is focally less than 0.1 cm from lateral margin; The initial anterior margin cannot be effectively cleared of invasive tumor but the additional anterior margin (specimen #10) is benign. DCIS, distance from closest margin: Ductal carcinoma in situ is not identified. Regional Lymph Nodes: Number of Lymph Nodes Examined: 4. Number of Sentinel Lymph Nodes Examined: 4. Lymph Nodes with Macrometastases: 0. Lymph Nodes with Micrometastases: 0. Lymph Nodes with Isolated Tumor Cells: 0. Breast Prognostic Profile: Performed on previous case, [id number]: Estrogen Receptor: 90%, positive. Progesterone Receptor: 90%, positive. Her-2: 1.24 ratio, negative. Ki-67: 1%. Pathologic Stage Classification (pTNM, AJCC 8th Edition): Primary Tumor (pT): pT2. Regional Lymph Nodes (pN): pN0. Distant Metastases (pM): MX. Comments: After review of the microscopic slide sections, the gross specimen is re-evaluated. The tumor mass is grossly 1.1 cm away from the lateral margin and the combined dimension of the tumor mass added with the tissue taken at the lateral margin is at least 2.1 cm (the total tumor  dimension is estimated to measure at least 2.1 cm correlating with a pT2 tumor). A cytokeratin AE1/AE3 immunohistochemical stain is performed on two blocks, 1D and 30M to assess the extent of the tumor with respect to the margins on the lumpectomy specimen. The findings are consistent with the above assessment. 2. - 5. A cytokeratin AE1/AE3 immunohistochemical stain is performed on all lymph node tissue (four stains total). The stains fail to demonstrate evidence of metastatic carcinoma. (RH:ecj 02/17/2017)   Diagnosis 01/15/2017 Breast, left, needle core biopsy, 2:00 o'clock - INVASIVE LOBULAR CARCINOMA. - ATYPICAL LOBULAR HYPERPLASIA. - MICROCALCIFICATIONS PRESENT. - SEE COMMENT. Microscopic Comment Immunohistochemical stains are performed after review of the H&E stained sections for cytokeratin AE1/AE3, calponin, smooth muscle myosin, p63 and E-Cadherin. The morphology coupled with the staining pattern is consistent with the above diagnosis. Although definitive grading of breast carcinoma is best done on excision, the features of the invasive tumor from the left breast 2 o'clock biopsy are compatible with a grade 1 breast carcinoma. Breast prognostic markers will be performed and reported in an addendum. Findings are called to the [facility] on 01/19/2017. Dr. Vicente Males has seen this case in consultation with agreement. (RH:kh 01-19-17)  PROGNOSTIC INDICATORS Estrogen Receptor: 90%, POSITIVE, STRONG STAINING INTENSITY Progesterone Receptor: 90%, POSITIVE, STRONG STAINING INTENSITY Proliferation Marker Ki67: 1% HER2 - NEGATIVE  RADIOGRAPHIC STUDIES: I have personally reviewed the radiological images as listed and agreed with the findings in the report. No results found.   Bone Density Scan 07/29/17 ASSESSMENT: The BMD measured at Femur Neck is 0.887 g/cm2 with a T-score of -1.1. This patient is considered osteopenic according to [facility])  criteria. Site Region Measured Date Measured Age YA BMD Significant CHANGE T-score Right Femur Neck   07/29/[age]         -1.3    0.887 g/cm2 AP Spine    L1-L4  07/29/2017    [age]         0.2     1.216 g/cm2   ASSESSMENT & PLAN: 61 y.o. postmenopausal woman, presented with screening discovered left breast cancer.  1. Malignant neoplasm of upper-outer quadrant of left breast, invasive lobular carcinoma, grade 1, pT2N0M0, ER+/PR+/HER2-, Oncotype RS low risk ---She underwent left breast lumpectomy and sentinel lymph node biopsy. -I discussed her surgical pathology results with her. She had a computed surgical resection, sentinel lymph nodes were negative. -I discussed her Oncotype test results, which showed a low risk, Her estimated 10 year risk of distant recurrence is 8% with tamoxifen. Adjuvant chemotherapy is not recommended in low risk disease. -she started adjuvant letrozole in 06/2017. She has experienced with joint pain and headaches. She is willing to switch to exemestane or Tamoxifen. I disucssed the side effects for both. She will switch to Exemestane this week.  -I encouraged her to watch her diet and to be active with exercise as to combat weight gain.  -She is clinically doing well. Labs reviewed, Breast exam unremarkable, there is no clinical concern for recurrence.  -Next mammogram in 12/2017 - f/u in 4 months -I advised her to see her PCP about the raised spot on her left forearm.    2. Hypertension and hypercholesterolemia -She'll follow-up with her primary care physician  3. Arthritis -f/u with PCP   4. Osteopenia  -07/29/17 bone density shows osteopenia in femur neck with a T-Score of -1.1.  -I disucssed anti-estrogen therapy can lessen her bone density.  -I encouraged her to start calcium and vitamin D.    Plan: -Prescribe Exemestane to start in a few weeks  -Mammogram in 12/2017 -Lab and f/u in 4 months    All questions were answered. The patient knows to  call the clinic with any problems, questions or concerns. I spent 25 minutes counseling the patient face to face. The total time spent in the appointment was 30 minutes and more than 50% was on counseling.  This document serves as a record of services personally performed by Truitt Merle, MD. It was created on her behalf by Joslyn Devon, a trained medical scribe. The creation of this record is based on the scribe's personal observations and the provider's statements to them.    I have reviewed the above documentation for accuracy and completeness, and I agree with the above.  Truitt Merle, MD 11/25/2017

## 2017-11-25 ENCOUNTER — Other Ambulatory Visit (HOSPITAL_BASED_OUTPATIENT_CLINIC_OR_DEPARTMENT_OTHER): Payer: Commercial Managed Care - PPO

## 2017-11-25 ENCOUNTER — Ambulatory Visit (HOSPITAL_BASED_OUTPATIENT_CLINIC_OR_DEPARTMENT_OTHER): Payer: Commercial Managed Care - PPO | Admitting: Hematology

## 2017-11-25 VITALS — BP 143/78 | HR 74 | Temp 98.4°F | Resp 18 | Ht 66.0 in | Wt 219.6 lb

## 2017-11-25 DIAGNOSIS — Z79811 Long term (current) use of aromatase inhibitors: Secondary | ICD-10-CM | POA: Diagnosis not present

## 2017-11-25 DIAGNOSIS — M8580 Other specified disorders of bone density and structure, unspecified site: Secondary | ICD-10-CM

## 2017-11-25 DIAGNOSIS — I10 Essential (primary) hypertension: Secondary | ICD-10-CM | POA: Diagnosis not present

## 2017-11-25 DIAGNOSIS — Z170 Estrogen receptor positive status [ER+]: Secondary | ICD-10-CM | POA: Diagnosis not present

## 2017-11-25 DIAGNOSIS — C50412 Malignant neoplasm of upper-outer quadrant of left female breast: Secondary | ICD-10-CM

## 2017-11-25 LAB — COMPREHENSIVE METABOLIC PANEL
ALT: 16 U/L (ref 0–55)
ANION GAP: 13 meq/L — AB (ref 3–11)
AST: 15 U/L (ref 5–34)
Albumin: 3.5 g/dL (ref 3.5–5.0)
Alkaline Phosphatase: 123 U/L (ref 40–150)
BILIRUBIN TOTAL: 0.57 mg/dL (ref 0.20–1.20)
BUN: 10.8 mg/dL (ref 7.0–26.0)
CALCIUM: 9.5 mg/dL (ref 8.4–10.4)
CO2: 22 meq/L (ref 22–29)
CREATININE: 0.9 mg/dL (ref 0.6–1.1)
Chloride: 104 mEq/L (ref 98–109)
EGFR: >60 mL/min/{1.73_m2} (ref >60)
Glucose: 121 mg/dl (ref 70–140)
Potassium: 3.5 mEq/L (ref 3.5–5.1)
Sodium: 139 mEq/L (ref 136–145)
TOTAL PROTEIN: 7.5 g/dL (ref 6.4–8.3)

## 2017-11-25 LAB — CBC WITH DIFFERENTIAL/PLATELET
BASO%: 0.3 % (ref 0.0–2.0)
BASOS ABS: 0 10*3/uL (ref 0.0–0.1)
EOS%: 2.6 % (ref 0.0–7.0)
Eosinophils Absolute: 0.2 10*3/uL (ref 0.0–0.5)
HCT: 37.6 % (ref 34.8–46.6)
HEMOGLOBIN: 12.2 g/dL (ref 11.6–15.9)
LYMPH%: 26 % (ref 14.0–49.7)
MCH: 26.4 pg (ref 25.1–34.0)
MCHC: 32.4 g/dL (ref 31.5–36.0)
MCV: 81.4 fL (ref 79.5–101.0)
MONO#: 0.5 10*3/uL (ref 0.1–0.9)
MONO%: 6.2 % (ref 0.0–14.0)
NEUT#: 4.8 10*3/uL (ref 1.5–6.5)
NEUT%: 64.9 % (ref 38.4–76.8)
Platelets: 335 10*3/uL (ref 145–400)
RBC: 4.62 10*6/uL (ref 3.70–5.45)
RDW: 15.5 % — AB (ref 11.2–14.5)
WBC: 7.4 10*3/uL (ref 3.9–10.3)
lymph#: 1.9 10*3/uL (ref 0.9–3.3)

## 2017-11-25 MED ORDER — EXEMESTANE 25 MG PO TABS: 25.0000 mg | ORAL_TABLET | Freq: Every day | ORAL | 1 refills | Status: DC

## 2017-11-26 ENCOUNTER — Encounter: Payer: Self-pay | Admitting: Hematology

## 2018-01-05 LAB — HM PAP SMEAR

## 2018-01-19 ENCOUNTER — Ambulatory Visit
Admission: RE | Admit: 2018-01-19 | Discharge: 2018-01-19 | Disposition: A | Discharge status: Home / Self Care | Payer: Commercial Managed Care - PPO | Source: Ambulatory Visit | Attending: Adult Health | Admitting: Adult Health

## 2018-01-19 DIAGNOSIS — C50412 Malignant neoplasm of upper-outer quadrant of left female breast: Secondary | ICD-10-CM

## 2018-01-19 DIAGNOSIS — R928 Other abnormal and inconclusive findings on diagnostic imaging of breast: Secondary | ICD-10-CM | POA: Diagnosis not present

## 2018-01-19 DIAGNOSIS — Z170 Estrogen receptor positive status [ER+]: Principal | ICD-10-CM

## 2018-01-19 HISTORY — DX: Personal history of irradiation: Z92.3

## 2018-03-16 ENCOUNTER — Other Ambulatory Visit: Payer: Self-pay | Admitting: Hematology

## 2018-03-25 NOTE — Progress Notes (Signed)
[name]  [phone] [phone]  Clinic Follow Up Note   Patient Care Team: Hoyt Koch, MD as PCP - General (Internal Medicine) Delice Bison, Charlestine Massed, NP as Nurse Practitioner (Hematology and Oncology) Truitt Merle, MD as Consulting Physician (Hematology) Kyung Rudd, MD as Consulting Physician (Radiation Oncology) Rolm Bookbinder, MD as Consulting Physician (General Surgery) 03/26/2018  CHIEF COMPLAINTS:  Follow up left breast cancer  Oncology History   Cancer Staging Malignant neoplasm of upper-outer quadrant of left breast in female, estrogen receptor positive ([place]) Staging form: Breast, AJCC 8th Edition - Clinical stage from 01/15/2017: Stage IA (cT1c, cN0, cM0, G1, ER: Positive, PR: Positive, HER2: Negative) - Signed by Truitt Merle, MD on 01/27/2017      Malignant neoplasm of upper-outer quadrant of left breast in female, estrogen receptor positive ([place])   01/15/2017 Initial Biopsy    Left breast 2:00 core needle biopsy showed invasive lobular carcinoma, atypical lobular hyperplasia. Grade 1.      01/15/2017 Receptors her2    ER 90% positive, PR 90% positive, HER-2 negative, Ki-67 1%      01/15/2017 Mammogram    Bilateral diagnostic mammogram and ultrasound showed irregular hypoechoic mass in the left breast 2:00 position, 9 cm from the nipple, measuring 1.2 x 1.0 x 0.8 cm, no evidence of malignancy within the right breast, ultrasound of the left axilla was negative. Breast density category B      01/15/2017 Initial Diagnosis    Malignant neoplasm of upper-outer quadrant of left breast in female, estrogen receptor positive ([place])      02/16/2017 Pathology Results    Diagnosis 1. Breast, lumpectomy, Left - INVASIVE GRADE I LOBULAR CARCINOMA. - TUMOR IS ESTIMATED TO SPAN AT LEAST 2.1 CM IN GREATEST DIMENSION. 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 - INVASIVE TUMOR IS FOCALLY LESS THAN 0.1 CM  TO LATERAL MARGIN BUT INKED MARGINAL SURFACE IS NEGATIVE FOR TUMOR. - INITIAL ANTERIOR MARGIN CANNOT BE EFFECTIVELY CLEARED OF INVASIVE TUMOR, PLEASE SEE SPECIMEN #10 FOR FINAL MARGIN STATUS. - SEE ONCOLOGY TEMPLATE. 2. 4 left axillary sentinel lymph nodes were negative 3. Additional resection of left posterior, superior, anterior and inferior margins were benign.       02/16/2017 Surgery    breast lumpectomy with radioactive seed and axillary sentinel lymph node biposy with Dr. Donne Hazel      02/16/2017 Oncotype testing    Testing resulted in reccurance score of 12.      02/16/2017 Pathology Results    Left breast lumpectomy showed a 2.1 cm invasive grade 1 lobular carcinoma, associated LCIS and atypical lobular hyperplasia. Surgical margins were negative. 4 sentinel lymph nodes were negative.      02/16/2017 Oncotype testing    RS 12, low risk, predicts 10 year risk of distant recurrence 8% with tamoxifen, adjuvant chemotherapy was not recommended.      03/09/2017 Imaging     IMPRESSION: Large complex fluid collection extending throughout the upper left breast, 3:00 to 9:00 axes, at posterior depth, presumed hematoma, possibly multiloculated.      03/18/2017 Surgery     Evacuation seroma left bresat with Dr. Donne Hazel       04/21/2017 - 06/08/2017 Radiation Therapy    Patient received radiation treatment with Dr. Lisbeth Renshaw      06/2017 -  Anti-estrogen oral therapy    Letrozole daily. Switch to Exemestane in 11/2017      09/03/2017 Survivorship    Survivorship Clinic with NP  01/19/2018 Mammogram          HISTORY OF PRESENTING ILLNESS:  [name] 62 y.o. female is here because of recently diagnosed left breast invasive lobular carcinoma. She presents to our multidisciplinary breast clinic today.   Her cancer was discovered by screening mammogram. She has no palpable breast mass or adenopathy. She feels well, no pain or other complain. She has mild left shoulder pain  since 2007. She had left hip replacement in 2016   The patient underwent bilateral diagnostic mammogram and bilateral breast ultrasound on 01/15/2017 to evaluate areas of possible architectural distortion within each breast. These scans sowed an irregular hypoechoic mass in the left breast at the 2 o'clock axis, 9 cm from the nipple, measuring 1.2 x 0.8 x 1 cm, a probable correlate for the architectural distortion seen on mammogram. There was no evidence of malignancy within the right breast.   Biopsy of the left breast at the 2:00 o'clock position on 01/15/2017 revealed invasive lobular carcinoma, atypical lobular hyperplasia, and microcalcification present. Pathology compatible with grade 1 breast carcinoma, ER/PR positive, HER-2 negative, Ki67 1%.  GYN HISTORY  Menarchal: 14 LMP: age of 30 Contraceptive: 34 years  HRT: no G1P1: one son, at age of 66   CURRENT THERAPY: Letrozole 2.10m daily starting 06/2017. Due to significant joint pain she switched to Exemestane starting 11/2018.   INTERIM HISTORY:  [name] presents to the clinic today for follow up of her anti-estrogen therapy. She presents to the clinic today by herself. She reports she is doing well overall. She reports she is tolerating Exemestane well. Her joint pain is much improved from letrozole. She does have bad hot flashes but she is tolerating it. They happen randomly either during the day or at night. She states she does not sleep that well but she is tolerating her symptoms.   On review of systems, pt denies  any other complaints at this time. Pertinent positives are listed and detailed within the above HPI.   MEDICAL HISTORY:  Past Medical History:  Diagnosis Date  . Arthritis   . Breast cancer ([facility] 01/15/2017   left breast  . [place]   . GERD (gastroesophageal reflux disease)   . HLD (hyperlipidemia)   . Hypertension   . Hypothyroidism   . Obesity   . Overactive bladder   . Palpitation   . Personal history  of radiation therapy   . Thyroid disease     SURGICAL HISTORY: Past Surgical History:  Procedure Laterality Date  . bladder stem stretch  1966  . BREAST LUMPECTOMY Left 02/16/2017  . BREAST LUMPECTOMY WITH RADIOACTIVE SEED AND SENTINEL LYMPH NODE BIOPSY Left 02/16/2017   Procedure: BREAST LUMPECTOMY WITH RADIOACTIVE SEED AND AXILLARY SENTINEL LYMPH NODE BIOPSY;  Surgeon: MRolm Bookbinder MD;  [facility]  Service: General;  Laterality: Left;  . EVACUATION BREAST HEMATOMA Left 03/18/2017   Procedure: EVACUATION SEROMA LEFT BREAST;  Surgeon: MRolm Bookbinder MD;  [facility]  Service: General;  Laterality: Left;  . JOINT REPLACEMENT    . KNEE ARTHROSCOPY     LEFT  . NOSE SURGERY  1980  . SHOULDER SURGERY Left 2007  . TONSILLECTOMY  1964  . TOTAL HIP ARTHROPLASTY Left 10/23/2015   Procedure: TOTAL HIP ARTHROPLASTY ANTERIOR APPROACH;  Surgeon: PMelrose Nakayama MD;  [facility]  Service: Orthopedics;  Laterality: Left;    SOCIAL HISTORY: Social History   Socioeconomic History  . Marital status: Single  Spouse name: Not on file  . Number of children: Not on file  . Years of education: Not on file  . Highest education level: Not on file  Occupational History  . Not on file  Social Needs  . Financial resource strain: Not on file  . Food insecurity:    Worry: Not on file    Inability: Not on file  . Transportation needs:    Medical: Not on file    Non-medical: Not on file  Tobacco Use  . Smoking status: Never Smoker  . Smokeless tobacco: Never Used  Substance and Sexual Activity  . Alcohol use: No  . Drug use: No  . Sexual activity: Not on file  Lifestyle  . Physical activity:    Days per week: Not on file    Minutes per session: Not on file  . Stress: Not on file  Relationships  . Social connections:    Talks on phone: Not on file    Gets together: Not on file    Attends religious service: Not on file     Active member of club or organization: Not on file    Attends meetings of clubs or organizations: Not on file    Relationship status: Not on file  . Intimate partner violence:    Fear of current or ex partner: Not on file    Emotionally abused: Not on file    Physically abused: Not on file    Forced sexual activity: Not on file  Other Topics Concern  . Not on file  Social History Narrative  . Not on file    FAMILY HISTORY: Family History  Problem Relation Age of Onset  . Heart Problems Father        cardiac arrest  . Heart attack Mother   . Heart disease Mother   . Cancer Maternal Uncle 62       colon cancer   . Cancer Maternal Grandmother        bladder cancer     ALLERGIES:  has No Known Allergies.  MEDICATIONS:  Current Outpatient Medications  Medication Sig Dispense Refill  . acetaminophen (TYLENOL) 325 MG tablet Take 650 mg by mouth every 6 (six) hours as needed for mild pain.    . clobetasol cream (TEMOVATE) 1.61 % Apply 1 application topically 2 (two) times daily as needed (itching).     Marland Kitchen exemestane (AROMASIN) 25 MG tablet TAKE 1 TABLET BY MOUTH  DAILY AFTER BREAKFAST 90 tablet 1  . hydrochlorothiazide (HYDRODIURIL) 25 MG tablet TAKE 1 TABLET BY MOUTH EVERY DAY 30 tablet 3  . levothyroxine (SYNTHROID, LEVOTHROID) 88 MCG tablet Take 88 mcg by mouth daily. Take 2 tabs for 2 days  Alternate with 44 mcg for 1 day, then so forth.  12  . metoprolol succinate (TOPROL-XL) 25 MG 24 hr tablet TAKE 1 TABLET BY MOUTH EVERY DAY (Patient taking differently: TAKE 1/2 TABLET BY MOUTH EVERY DAY) 90 tablet 3  . pantoprazole (PROTONIX) 40 MG tablet Take 1 tablet (40 mg total) by mouth daily. Pt. needs to set up follow up appt. 30 tablet 0  . simvastatin (ZOCOR) 20 MG tablet Take 20 mg by mouth daily.    . potassium chloride SA (K-DUR,KLOR-CON) 20 MEQ tablet Take 1 tablet (20 mEq total) by mouth 2 (two) times daily. 7 tablet 0   No current facility-administered medications for this  visit.     REVIEW OF SYSTEMS:   Constitutional: Denies fevers, chills or abnormal night  sweats (+) hot flash (+) weight loss 13 lbs Eyes: Denies blurriness of vision, double vision or watery eyes Ears, nose, mouth, throat, and face: Denies mucositis or sore throat Respiratory: Denies cough, dyspnea or wheezes Cardiovascular: Denies palpitation, chest discomfort or lower extremity swelling Gastrointestinal:  Denies nausea, heartburn or change in bowel habits Skin: Denies abnormal skin rashes  Lymphatics: Denies new lymphadenopathy or easy bruising MSK: (+) joint pain, improved Neurological:Denies numbness, tingling or new weaknesses Behavioral/Psych: Mood is stable, no new changes  BREAST: (+) tenderness  All other systems were reviewed with the patient and are negative.  PHYSICAL EXAMINATION: ECOG PERFORMANCE STATUS: 1  Vitals:   03/26/18 1531  BP: 130/76  Pulse: 78  Resp: 18  Temp: 98.4 F (36.9 C)  SpO2: 98%   Filed Weights   03/26/18 1531  Weight: 206 lb 8 oz (93.7 kg)    GENERAL:alert, no distress and comfortable SKIN: skin color, texture, turgor are normal, no rashes or significant lesions EYES: normal, conjunctiva are pink and non-injected, sclera clear OROPHARYNX:no exudate, no erythema and lips, buccal mucosa, and tongue normal  NECK: supple, thyroid normal size, non-tender, without nodularity LYMPH:  no palpable lymphadenopathy in the cervical, axillary or inguinal LUNGS: clear to auscultation and percussion with normal breathing effort HEART: regular rate & rhythm and no murmurs and no lower extremity edema ABDOMEN:abdomen soft, non-tender and normal bowel sounds Musculoskeletal:no cyanosis of digits and no clubbing  PSYCH: alert & oriented x 3 with fluent speech NEURO: no focal motor/sensory deficits Breasts: Breast inspection showed them to be symmetrical with no nipple discharge. Surgical scar in the left breast has healed very well. Her left breast is  firmer than the right breast. No significant skin change from radiation. Palpation of the breasts and axilla revealed no obvious mass that I could appreciate. Mild lymphedema of left breast almost resolved.    LABORATORY DATA:  I have reviewed the data as listed CBC Latest Ref Rng & Units 03/26/2018 11/25/2017 01/28/2017  WBC 3.9 - 10.3 K/uL 6.9 7.4 6.9  Hemoglobin 11.6 - 15.9 g/dL 12.4 12.2 13.7  Hematocrit 34.8 - 46.6 % 37.8 37.6 42.3  Platelets 145 - 400 K/uL 351 335 342    CMP Latest Ref Rng & Units 03/26/2018 11/25/2017 02/11/2017  Glucose 70 - 140 mg/dL 115 121 97  BUN 7 - 26 mg/dL 16 10.8 10  Creatinine 0.60 - 1.10 mg/dL 0.88 0.9 0.89  Sodium 136 - 145 mmol/L 139 139 136  Potassium 3.5 - 5.1 mmol/L 3.2(L) 3.5 3.4(L)  Chloride 98 - 109 mmol/L 104 - 97(L)  CO2 22 - 29 mmol/L _0 Calcium 8.4 - 10.4 mg/dL 9.8 9.5 9.5  Total Protein 6.4 - 8.3 g/dL 7.6 7.5 -  Total Bilirubin 0.2 - 1.2 mg/dL 0.6 0.57 -  Alkaline Phos 40 - 150 U/L 118 123 -  AST 5 - 34 U/L 23 15 -  ALT 0 - 55 U/L 27 16 -   PATHOLOGY Diagnosis 02/16/2017 1. Breast, lumpectomy, Left - INVASIVE GRADE I LOBULAR CARCINOMA. - TUMOR IS ESTIMATED TO SPAN AT LEAST 2.1 CM IN GREATEST DIMENSION. 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 - INVASIVE TUMOR IS FOCALLY LESS THAN 0.1 CM TO LATERAL MARGIN BUT INKED MARGINAL SURFACE IS NEGATIVE FOR TUMOR. - INITIAL ANTERIOR MARGIN CANNOT BE EFFECTIVELY CLEARED OF INVASIVE TUMOR, PLEASE SEE SPECIMEN #10 FOR FINAL MARGIN STATUS. - SEE ONCOLOGY TEMPLATE. 2. Lymph node, sentinel, biopsy, Left Axillary - ONE  BENIGN LYMPH NODE NODE WITH NO TUMOR SEEN (0/1). - SEE COMMENT. 3. Lymph node, sentinel, biopsy, Left Axillary - ONE BENIGN LYMPH NODE NODE WITH NO TUMOR SEEN (0/1). - SEE COMMENT. 4. Lymph node, sentinel, biopsy, Left Axillary - ONE BENIGN LYMPH NODE NODE WITH NO TUMOR SEEN (0/1). - SEE COMMENT. 5. Lymph node, sentinel, biopsy, Left Axillary -  ONE BENIGN LYMPH NODE NODE WITH NO TUMOR SEEN (0/1). - SEE COMMENT. 6. Breast, excision, Left additional Posterior Margin - BENIGN BREAST PARENCHYMA AND FIBROFATTY SOFT TISSUE. - NO ATYPIA OR TUMOR SEEN. 7. Breast, excision, Left additional Superior Margin - BENIGN BREAST PARENCHYMA AND FIBROFATTY SOFT TISSUE. - NO ATYPIA OR TUMOR SEEN. 8. Breast, excision, Left additional Inferior Margin - BREAST PARENCHYMA SHOWING USUAL DUCTAL HYPERPLASIA. - INCIDENTAL HYALINIZED FIBROADENOMA WITH CALCIFICATIONS. - NO ATYPIA OR TUMOR SEEN. 1 of 5 FINAL for Arboleda, Avleen L ([id number]) Diagnosis(continued) 9. Breast, excision, Left additional Medial Margin - BENIGN BREAST PARENCHYMA AND FIBROFATTY SOFT TISSUE. - NO ATYPIA OR TUMOR SEEN. 10. Breast, excision, Left additional Anterior Margin - BENIGN FIBROFATTY SOFT TISSUE. - NO ATYPIA OR TUMOR SEEN. Microscopic Comment 1. BREAST, INVASIVE TUMOR Procedure: Left lumpectomy with left sentinel lymph node biopsies and additional posterior, superior, medial and anterior left margin excisions. Laterality: Left. Tumor Size: At least 2.1 cm in greatest dimension, see below comment. Histologic Type: Invasive lobular carcinoma. Grade: 1. Tubular Differentiation: 3. Nuclear Pleomorphism: 1. Mitotic Count: 1. Ductal Carcinoma in Situ (DCIS): Not identified. Extent of Tumor: Tumor confined to breast parenchyma. Skin: Not received. Nipple: Not received. Skeletal muscle: Not received. Margins: Invasive carcinoma, distance from closest margin: Invasive lobular carcinoma is focally less than 0.1 cm from lateral margin; The initial anterior margin cannot be effectively cleared of invasive tumor but the additional anterior margin (specimen #10) is benign. DCIS, distance from closest margin: Ductal carcinoma in situ is not identified. Regional Lymph Nodes: Number of Lymph Nodes Examined: 4. Number of Sentinel Lymph Nodes Examined: 4. Lymph Nodes with  Macrometastases: 0. Lymph Nodes with Micrometastases: 0. Lymph Nodes with Isolated Tumor Cells: 0. Breast Prognostic Profile: Performed on previous case, [id number]: Estrogen Receptor: 90%, positive. Progesterone Receptor: 90%, positive. Her-2: 1.24 ratio, negative. Ki-67: 1%. Pathologic Stage Classification (pTNM, AJCC 8th Edition): Primary Tumor (pT): pT2. Regional Lymph Nodes (pN): pN0. Distant Metastases (pM): MX. Comments: After review of the microscopic slide sections, the gross specimen is re-evaluated. The tumor mass is grossly 1.1 cm away from the lateral margin and the combined dimension of the tumor mass added with the tissue taken at the lateral margin is at least 2.1 cm (the total tumor dimension is estimated to measure at least 2.1 cm correlating with a pT2 tumor). A cytokeratin AE1/AE3 immunohistochemical stain is performed on two blocks, 1D and 42M to assess the extent of the tumor with respect to the margins on the lumpectomy specimen. The findings are consistent with the above assessment. 2. - 5. A cytokeratin AE1/AE3 immunohistochemical stain is performed on all lymph node tissue (four stains total). The stains fail to demonstrate evidence of metastatic carcinoma. (RH:ecj 02/17/2017)   Diagnosis 01/15/2017 Breast, left, needle core biopsy, 2:00 o'clock - INVASIVE LOBULAR CARCINOMA. - ATYPICAL LOBULAR HYPERPLASIA. - MICROCALCIFICATIONS PRESENT. - SEE COMMENT. Microscopic Comment Immunohistochemical stains are performed after review of the H&E stained sections for cytokeratin AE1/AE3, calponin, smooth muscle myosin, p63 and E-Cadherin. The morphology coupled with the staining pattern is consistent with the above diagnosis. Although definitive grading of breast carcinoma is best done  on excision, the features of the invasive tumor from the left breast 2 o'clock biopsy are compatible with a grade 1 breast carcinoma. Breast prognostic markers will be performed and reported  in an addendum. Findings are called to the [facility] on 01/19/2017. Dr. Vicente Males has seen this case in consultation with agreement. (RH:kh 01-19-17)  PROGNOSTIC INDICATORS Estrogen Receptor: 90%, POSITIVE, STRONG STAINING INTENSITY Progesterone Receptor: 90%, POSITIVE, STRONG STAINING INTENSITY Proliferation Marker Ki67: 1% HER2 - NEGATIVE  RADIOGRAPHIC STUDIES: I have personally reviewed the radiological images as listed and agreed with the findings in the report. No results found.   Diagnostic Mammogram 01/19/18 IMPRESSION: No mammographic evidence of breast malignancy.  Bone Density Scan 07/29/17 ASSESSMENT: The BMD measured at Femur Neck is 0.887 g/cm2 with a T-score of -1.1. This patient is considered osteopenic according to [facility]) criteria. Site Region Measured Date Measured Age YA BMD Significant CHANGE T-score Right Femur Neck   07/29/2017    61.0         -1.1    0.887 g/cm2 AP Spine    L1-L4  07/29/2017    61.0         0.2     1.216 g/cm2   ASSESSMENT & PLAN: 62 y.o. postmenopausal woman, presented with screening discovered left breast cancer.  1. Malignant neoplasm of upper-outer quadrant of left breast, invasive lobular carcinoma, grade 1, pT2N0M0, ER+/PR+/HER2-, Oncotype RS low risk -She underwent left breast lumpectomy and sentinel lymph node biopsy in Feb 2018. -I previously discussed her surgical pathology results with her. She had a computed surgical resection, sentinel lymph nodes were negative. -I previously discussed her Oncotype test results, which showed a low risk, Her estimated 10 year risk of distant recurrence is 8% with tamoxifen. Adjuvant chemotherapy is not recommended in low risk disease. -she started adjuvant letrozole in 06/2017. She has experienced joint pain and headaches. She was to exemestane, she is tolerating exemestane very well, with mild joint stiffness, overall much more tolerable.  She will continue  for a total of 5-7 years.   -I again encouraged her to watch her diet and to be active with exercise as to combat weight gain.  -She is clinically doing well. Labs reviewed, otherwise normal with the exception of potassium of 3.2. Her physical exam unremarkable. Her mammogram from 01/19/18 revealed no evidence of malignancy. There is no clinical concern for recurrence.  -She will f/u with Dr. Donne Hazel in 3 months -F/u in 6 months   2. Hypertension, hypercholesterolemia, Hypokalemia  -She'll follow-up with her primary care physician -Her K is 3.2 today (03/26/18), she takes HCTZ. I gave her prescription for a K supplement that she can take for a week and then switch to OTC K supplement. I encouraged her to eat a K rich diet.   3. Arthritis -f/u with PCP   4. Osteopenia  -07/29/17 bone density shows osteopenia in femur neck with a T-Score of -1.1.  -I again disucssed anti-estrogen therapy can lessen her bone density.  -I previously encouraged her to start calcium and vitamin D.   5. Hot Flash - She states she has bad hot flashes during the day and at night. She reports that they are tolerable.  -I offered her medication today and she feels she does not need it right now. She will tell me if it becomes unbreable  6.  Hypokalemia -Her potassium is 3.2 today, she is on hydrochlorothiazide which can cause hypokalemia  -I called in  potassium chloride 20 mEq once daily for 7 days, she will take potassium chloride over-the-counter or potassium enriched foods afterwards  Plan: -Continue Exemestane, refilled today  -Start K supplement -Lab and f/u in 6 months  -She will f/u with Dr. Donne Hazel in 3 months  All questions were answered. The patient knows to call the clinic with any problems, questions or concerns. I spent 20 minutes counseling the patient face to face. The total time spent in the appointment was 25 minutes and more than 50% was on counseling.  This document serves as a record of  services personally performed by Truitt Merle, MD. It was created on her behalf by Theresia Bough, a trained medical scribe. The creation of this record is based on the scribe's personal observations and the provider's statements to them.   I have reviewed the above documentation for accuracy and completeness, and I agree with the above.   Truitt Merle, MD 03/26/2018 5:09 PM

## 2018-03-26 ENCOUNTER — Encounter: Payer: Self-pay | Admitting: Hematology

## 2018-03-26 ENCOUNTER — Inpatient Hospital Stay: Payer: Commercial Managed Care - PPO

## 2018-03-26 ENCOUNTER — Inpatient Hospital Stay: Payer: Commercial Managed Care - PPO | Attending: Hematology | Admitting: Hematology

## 2018-03-26 VITALS — BP 130/76 | HR 78 | Temp 98.4°F | Resp 18 | Ht 66.0 in | Wt 206.5 lb

## 2018-03-26 DIAGNOSIS — Z9221 Personal history of antineoplastic chemotherapy: Secondary | ICD-10-CM | POA: Diagnosis not present

## 2018-03-26 DIAGNOSIS — K219 Gastro-esophageal reflux disease without esophagitis: Secondary | ICD-10-CM | POA: Insufficient documentation

## 2018-03-26 DIAGNOSIS — Z170 Estrogen receptor positive status [ER+]: Principal | ICD-10-CM

## 2018-03-26 DIAGNOSIS — Z79899 Other long term (current) drug therapy: Secondary | ICD-10-CM | POA: Diagnosis not present

## 2018-03-26 DIAGNOSIS — Z923 Personal history of irradiation: Secondary | ICD-10-CM | POA: Insufficient documentation

## 2018-03-26 DIAGNOSIS — E669 Obesity, unspecified: Secondary | ICD-10-CM | POA: Diagnosis not present

## 2018-03-26 DIAGNOSIS — M1990 Unspecified osteoarthritis, unspecified site: Secondary | ICD-10-CM | POA: Diagnosis not present

## 2018-03-26 DIAGNOSIS — Z79811 Long term (current) use of aromatase inhibitors: Secondary | ICD-10-CM | POA: Diagnosis not present

## 2018-03-26 DIAGNOSIS — N951 Menopausal and female climacteric states: Secondary | ICD-10-CM | POA: Insufficient documentation

## 2018-03-26 DIAGNOSIS — E7800 Pure hypercholesterolemia, unspecified: Secondary | ICD-10-CM | POA: Insufficient documentation

## 2018-03-26 DIAGNOSIS — I10 Essential (primary) hypertension: Secondary | ICD-10-CM | POA: Insufficient documentation

## 2018-03-26 DIAGNOSIS — M8580 Other specified disorders of bone density and structure, unspecified site: Secondary | ICD-10-CM | POA: Diagnosis not present

## 2018-03-26 DIAGNOSIS — N3281 Overactive bladder: Secondary | ICD-10-CM | POA: Diagnosis not present

## 2018-03-26 DIAGNOSIS — E876 Hypokalemia: Secondary | ICD-10-CM | POA: Insufficient documentation

## 2018-03-26 DIAGNOSIS — Z96642 Presence of left artificial hip joint: Secondary | ICD-10-CM | POA: Insufficient documentation

## 2018-03-26 DIAGNOSIS — C50412 Malignant neoplasm of upper-outer quadrant of left female breast: Secondary | ICD-10-CM

## 2018-03-26 LAB — CBC WITH DIFFERENTIAL/PLATELET
BASOS ABS: 0.1 10*3/uL (ref 0.0–0.1)
BASOS PCT: 1 %
EOS ABS: 0.2 10*3/uL (ref 0.0–0.5)
Eosinophils Relative: 3 %
HEMATOCRIT: 37.8 % (ref 34.8–46.6)
HEMOGLOBIN: 12.4 g/dL (ref 11.6–15.9)
Lymphocytes Relative: 29 %
Lymphs Abs: 2 10*3/uL (ref 0.9–3.3)
MCH: 26.1 pg (ref 25.1–34.0)
MCHC: 32.8 g/dL (ref 31.5–36.0)
MCV: 79.7 fL (ref 79.5–101.0)
Monocytes Absolute: 0.6 10*3/uL (ref 0.1–0.9)
Monocytes Relative: 9 %
NEUTROS ABS: 3.9 10*3/uL (ref 1.5–6.5)
NEUTROS PCT: 58 %
Platelets: 351 10*3/uL (ref 145–400)
RBC: 4.74 MIL/uL (ref 3.70–5.45)
RDW: 15.4 % — ABNORMAL HIGH (ref 11.2–14.5)
WBC: 6.9 10*3/uL (ref 3.9–10.3)

## 2018-03-26 LAB — COMPREHENSIVE METABOLIC PANEL
ALT: 27 U/L (ref 0–55)
AST: 23 U/L (ref 5–34)
Albumin: 3.7 g/dL (ref 3.5–5.0)
Alkaline Phosphatase: 118 U/L (ref 40–150)
Anion gap: 10 (ref 3–11)
BILIRUBIN TOTAL: 0.6 mg/dL (ref 0.2–1.2)
BUN: 16 mg/dL (ref 7–26)
CALCIUM: 9.8 mg/dL (ref 8.4–10.4)
CO2: 25 mmol/L (ref 22–29)
CREATININE: 0.88 mg/dL (ref 0.60–1.10)
Chloride: 104 mmol/L (ref 98–109)
GFR calc Af Amer: >60 mL/min (ref >60)
GFR calc non Af Amer: >60 mL/min (ref >60)
Glucose, Bld: 115 mg/dL (ref 70–140)
POTASSIUM: 3.2 mmol/L — AB (ref 3.5–5.1)
Sodium: 139 mmol/L (ref 136–145)
TOTAL PROTEIN: 7.6 g/dL (ref 6.4–8.3)

## 2018-03-26 MED ORDER — POTASSIUM CHLORIDE CRYS ER 20 MEQ PO TBCR: 20.0000 meq | EXTENDED_RELEASE_TABLET | Freq: Two times a day (BID) | ORAL | 0 refills | Status: DC

## 2018-05-14 ENCOUNTER — Ambulatory Visit: Payer: Commercial Managed Care - PPO | Admitting: Internal Medicine

## 2018-05-14 ENCOUNTER — Encounter: Payer: Self-pay | Admitting: Internal Medicine

## 2018-05-14 DIAGNOSIS — R059 Cough, unspecified: Secondary | ICD-10-CM

## 2018-05-14 DIAGNOSIS — R05 Cough: Secondary | ICD-10-CM | POA: Diagnosis not present

## 2018-05-14 MED ORDER — BENZONATATE 200 MG PO CAPS: 200.0000 mg | ORAL_CAPSULE | Freq: Three times a day (TID) | ORAL | 0 refills | Status: DC | PRN

## 2018-05-14 MED ORDER — AZITHROMYCIN 250 MG PO TABS: ORAL_TABLET | ORAL | 0 refills | Status: DC

## 2018-05-14 NOTE — Patient Instructions (Signed)
We have sent in azithromycin to take 2 pills daily, then starting tomorrow take 1 pill daily until it's gone.   We have sent in tessalon perles to use 3-4 times per day as needed.

## 2018-05-14 NOTE — Progress Notes (Signed)
   Subjective:    Patient ID: [name], female    DOB: 1956/09/28, 62 y.o.   MRN: [id number]  HPI The patient is a 62 YO female coming in for 5-6 days of symptoms. Started with some cough and ear pressure. Over the course has worsened to some SOB with coughing, cough which is productive of white to clear. Some fevers yesterday with muscle aches and chills. She is taking mucinex and hycodan cough medicine left over. She does have some sinus pressure and headaches. Denies sore throat. Appetite is down but still eating and drinking. Overall worsening. Non-smoker and not taking allergy medicine.   Review of Systems  Constitutional: Positive for activity change, appetite change, chills, fatigue and fever. Negative for unexpected weight change.  HENT: Positive for congestion, postnasal drip, rhinorrhea and sinus pressure. Negative for ear discharge, ear pain, sinus pain, sneezing, sore throat, tinnitus, trouble swallowing and voice change.   Eyes: Negative.   Respiratory: Positive for cough and shortness of breath. Negative for chest tightness and wheezing.   Cardiovascular: Negative.   Gastrointestinal: Negative.   Musculoskeletal: Positive for myalgias.  Neurological: Negative.       Objective:   Physical Exam  Constitutional: She is oriented to person, place, and time. She appears well-developed and well-nourished.  HENT:  Head: Normocephalic and atraumatic.  Oropharynx with redness and clear drainage, nose with swollen turbinates, TMs normal bilaterally  Eyes: EOM are normal.  Neck: Normal range of motion. No thyromegaly present.  Cardiovascular: Normal rate and regular rhythm.  Pulmonary/Chest: Effort normal and breath sounds normal. No respiratory distress. She has no wheezes. She has no rales.  Some rhonchi in the lungs bilaterally which partially clear with coughing  Abdominal: Soft.  Musculoskeletal: She exhibits tenderness.  Lymphadenopathy:    She has no cervical adenopathy.    Neurological: She is alert and oriented to person, place, and time.  Skin: Skin is warm and dry.   Vitals:   05/14/18 0813  BP: 132/70  Pulse: 72  Temp: 98.2 F (36.8 C)  TempSrc: Oral  SpO2: 99%  Weight: 208 lb (94.3 kg)  Height: 5\' 6"  (1.676 m)      Assessment & Plan:

## 2018-05-14 NOTE — Assessment & Plan Note (Signed)
Given rhonchi on exam rx for azithromcyin and tessalon perles. Call back if not improved.

## 2018-05-31 DIAGNOSIS — E039 Hypothyroidism, unspecified: Secondary | ICD-10-CM | POA: Diagnosis not present

## 2018-05-31 DIAGNOSIS — Z01419 Encounter for gynecological examination (general) (routine) without abnormal findings: Secondary | ICD-10-CM | POA: Diagnosis not present

## 2018-06-02 DIAGNOSIS — C50412 Malignant neoplasm of upper-outer quadrant of left female breast: Secondary | ICD-10-CM | POA: Diagnosis not present

## 2018-07-26 DIAGNOSIS — E039 Hypothyroidism, unspecified: Secondary | ICD-10-CM | POA: Diagnosis not present

## 2018-08-13 DIAGNOSIS — C50912 Malignant neoplasm of unspecified site of left female breast: Secondary | ICD-10-CM | POA: Diagnosis not present

## 2018-08-16 ENCOUNTER — Telehealth: Payer: Self-pay

## 2018-08-16 NOTE — Telephone Encounter (Signed)
Faxed signed order for prosthesis to [facility] [place].

## 2018-08-20 ENCOUNTER — Other Ambulatory Visit: Payer: Self-pay | Admitting: Hematology

## 2018-09-21 NOTE — Progress Notes (Signed)
[facility]  [phone] [phone]  Clinic Follow Up Note   Patient Care Team: Mallory Koch, MD as PCP - General (Internal Medicine) [name], Mallory Massed, NP as Nurse Practitioner (Hematology and Oncology) Mallory Merle, MD as Consulting Physician (Hematology) Mallory Rudd, MD as Consulting Physician (Radiation Oncology) Mallory Bookbinder, MD as Consulting Physician (General Surgery) 09/23/2018  CHIEF COMPLAINTS:  Follow up left breast cancer  Oncology History   Cancer Staging Malignant neoplasm of upper-outer quadrant of left breast in female, estrogen receptor positive ([place]) Staging form: Breast, AJCC 8th Edition - Clinical stage from 01/15/2017: Stage IA (cT1c, cN0, cM0, G1, ER: Positive, PR: Positive, HER2: Negative) - Signed by Mallory Merle, MD on 01/27/2017      Malignant neoplasm of upper-outer quadrant of left breast in female, estrogen receptor positive ([place])   01/15/2017 Initial Biopsy    Left breast 2:00 core needle biopsy showed invasive lobular carcinoma, atypical lobular hyperplasia. Grade 1.    01/15/2017 Receptors her2    ER 90% positive, PR 90% positive, HER-2 negative, Ki-67 1%    01/15/2017 Mammogram    Bilateral diagnostic mammogram and ultrasound showed irregular hypoechoic mass in the left breast 2:00 position, 9 cm from the nipple, measuring 1.2 x 1.0 x 0.8 cm, no evidence of malignancy within the right breast, ultrasound of the left axilla was negative. Breast density category B    01/15/2017 Initial Diagnosis    Malignant neoplasm of upper-outer quadrant of left breast in female, estrogen receptor positive ([place])    02/16/2017 Pathology Results    Diagnosis 1. Breast, lumpectomy, Left - INVASIVE GRADE I LOBULAR CARCINOMA. - TUMOR IS ESTIMATED TO SPAN AT LEAST 2.1 CM IN GREATEST DIMENSION. 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 - INVASIVE TUMOR IS FOCALLY LESS THAN 0.1 CM TO LATERAL  MARGIN BUT INKED MARGINAL SURFACE IS NEGATIVE FOR TUMOR. - INITIAL ANTERIOR MARGIN CANNOT BE EFFECTIVELY CLEARED OF INVASIVE TUMOR, PLEASE SEE SPECIMEN #10 FOR FINAL MARGIN STATUS. - SEE ONCOLOGY TEMPLATE. 2. 4 left axillary sentinel lymph nodes were negative 3. Additional resection of left posterior, superior, anterior and inferior margins were benign.     02/16/2017 Surgery    breast lumpectomy with radioactive seed and axillary sentinel lymph node biposy with Dr. Donne [name]    02/16/2017 Oncotype testing    Testing resulted in reccurance score of 12.    02/16/2017 Pathology Results    Left breast lumpectomy showed a 2.1 cm invasive grade 1 lobular carcinoma, associated LCIS and atypical lobular hyperplasia. Surgical margins were negative. 4 sentinel lymph nodes were negative.    02/16/2017 Oncotype testing    RS 12, low risk, predicts 10 year risk of distant recurrence 8% with tamoxifen, adjuvant chemotherapy was not recommended.    03/09/2017 Imaging     IMPRESSION: Large complex fluid collection extending throughout the upper left breast, 3:00 to 9:00 axes, at posterior depth, presumed hematoma, possibly multiloculated.    03/18/2017 Surgery     Evacuation seroma left bresat with Dr. Donne [name]     04/21/2017 - 06/08/2017 Radiation Therapy    Patient received radiation treatment with Dr. Lisbeth [name]    06/2017 -  Anti-estrogen oral therapy    Letrozole daily. Switch to Exemestane in 11/2017    09/03/2017 Survivorship    Survivorship Clinic with NP     01/19/2018 Mammogram    IMPRESSION: No mammographic evidence of breast malignancy.     HISTORY OF PRESENTING ILLNESS:  [name]  62 y.o. female is here because of recently diagnosed left breast invasive lobular carcinoma. She presents to our multidisciplinary breast clinic today.   Her cancer was discovered by screening mammogram. She has no palpable breast mass or adenopathy. She feels well, no pain or other complain. She has mild  left shoulder pain since 2007. She had left hip replacement in 2016   The patient underwent bilateral diagnostic mammogram and bilateral breast ultrasound on 01/15/2017 to evaluate areas of possible architectural distortion within each breast. These scans sowed an irregular hypoechoic mass in the left breast at the 2 o'clock axis, 9 cm from the nipple, measuring 1.2 x 0.8 x 1 cm, a probable correlate for the architectural distortion seen on mammogram. There was no evidence of malignancy within the right breast.   Biopsy of the left breast at the 2:00 o'clock position on 01/15/2017 revealed invasive lobular carcinoma, atypical lobular hyperplasia, and microcalcification present. Pathology compatible with grade 1 breast carcinoma, ER/PR positive, HER-2 negative, Ki67 1%.  GYN HISTORY  Menarchal: 14 LMP: age of 47 Contraceptive: 34 years  HRT: no G1P1: one son, at age of 34   CURRENT THERAPY: Letrozole 2.66m daily starting 06/2017. Due to significant joint pain she switched to Exemestane starting 11/2017.   INTERIM HISTORY:  [name] presents to the clinic today for follow up of her anti-estrogen therapy. She was last seen by me 6 months ago. Today, she is here alone at the clinic. She is doing well and is trying to lose weight with diet and exercise. She is tolerating exemestane well, with mind and tolerable joint pain. She has hot flashes as well.   MEDICAL HISTORY:  Past Medical History:  Diagnosis Date  . Arthritis   . Breast cancer ([facility] 01/15/2017   left breast  . Cancer ([facility]   . GERD (gastroesophageal reflux disease)   . HLD (hyperlipidemia)   . Hypertension   . Hypothyroidism   . Obesity   . Overactive bladder   . Palpitation   . Personal history of radiation therapy   . Thyroid disease     SURGICAL HISTORY: Past Surgical History:  Procedure Laterality Date  . bladder stem stretch  1966  . BREAST LUMPECTOMY Left 02/16/2017  . BREAST LUMPECTOMY WITH RADIOACTIVE SEED AND  SENTINEL LYMPH NODE BIOPSY Left 02/16/2017   Procedure: BREAST LUMPECTOMY WITH RADIOACTIVE SEED AND AXILLARY SENTINEL LYMPH NODE BIOPSY;  Surgeon: MRolm Bookbinder MD;  [facility]  Service: General;  Laterality: Left;  . EVACUATION BREAST HEMATOMA Left 03/18/2017   Procedure: EVACUATION SEROMA LEFT BREAST;  Surgeon: MRolm Bookbinder MD;  [facility]  Service: General;  Laterality: Left;  . JOINT REPLACEMENT    . KNEE ARTHROSCOPY     LEFT  . NOSE SURGERY  1980  . SHOULDER SURGERY Left 2007  . TONSILLECTOMY  1964  . TOTAL HIP ARTHROPLASTY Left 10/23/2015   Procedure: TOTAL HIP ARTHROPLASTY ANTERIOR APPROACH;  Surgeon: PMelrose Nakayama MD;  [facility]  Service: Orthopedics;  Laterality: Left;    SOCIAL HISTORY: Social History   Socioeconomic History  . Marital status: Single    Spouse name: Not on file  . Number of children: Not on file  . Years of education: Not on file  . Highest education level: Not on file  Occupational History  . Not on file  Social Needs  . Financial resource strain: Not on file  . Food insecurity:    Worry: Not on  file    Inability: Not on file  . Transportation needs:    Medical: Not on file    Non-medical: Not on file  Tobacco Use  . Smoking status: Never Smoker  . Smokeless tobacco: Never Used  Substance and Sexual Activity  . Alcohol use: No  . Drug use: No  . Sexual activity: Not on file  Lifestyle  . Physical activity:    Days per week: Not on file    Minutes per session: Not on file  . Stress: Not on file  Relationships  . Social connections:    Talks on phone: Not on file    Gets together: Not on file    Attends religious service: Not on file    Active member of club or organization: Not on file    Attends meetings of clubs or organizations: Not on file    Relationship status: Not on file  . Intimate partner violence:    Fear of current or ex partner: Not on file    Emotionally  abused: Not on file    Physically abused: Not on file    Forced sexual activity: Not on file  Other Topics Concern  . Not on file  Social History Narrative  . Not on file    FAMILY HISTORY: Family History  Problem Relation Age of Onset  . Heart Problems Father        cardiac arrest  . Heart attack Mother   . Heart disease Mother   . Cancer Maternal Uncle 78       colon cancer   . Cancer Maternal Grandmother        bladder cancer     ALLERGIES:  has No Known Allergies.  MEDICATIONS:  Current Outpatient Medications  Medication Sig Dispense Refill  . acetaminophen (TYLENOL) 325 MG tablet Take 650 mg by mouth every 6 (six) hours as needed for mild pain.    . clobetasol cream (TEMOVATE) 8.18 % Apply 1 application topically 2 (two) times daily as needed (itching).     Marland Kitchen exemestane (AROMASIN) 25 MG tablet TAKE 1 TABLET BY MOUTH  DAILY AFTER BREAKFAST 90 tablet 1  . hydrochlorothiazide (HYDRODIURIL) 25 MG tablet TAKE 1 TABLET BY MOUTH EVERY DAY 30 tablet 3  . levothyroxine (SYNTHROID, LEVOTHROID) 88 MCG tablet Take 75 mcg by mouth daily. Take 2 tabs for 2 days  Alternate with 44 mcg for 1 day, then so forth.  12  . metoprolol succinate (TOPROL-XL) 25 MG 24 hr tablet TAKE 1 TABLET BY MOUTH EVERY DAY (Patient taking differently: TAKE 1/2 TABLET BY MOUTH EVERY DAY) 90 tablet 3  . pantoprazole (PROTONIX) 40 MG tablet Take 1 tablet (40 mg total) by mouth daily. Pt. needs to set up follow up appt. 30 tablet 0  . simvastatin (ZOCOR) 20 MG tablet Take 20 mg by mouth daily.     No current facility-administered medications for this visit.     REVIEW OF SYSTEMS:   Constitutional: Denies fevers, chills or abnormal night sweats (+) hot flashes (+) weight loss, intentional  Eyes: Denies blurriness of vision, double vision or watery eyes Ears, nose, mouth, throat, and face: Denies mucositis or sore throat Respiratory: Denies cough, dyspnea or wheezes Cardiovascular: Denies palpitation, chest  discomfort or lower extremity swelling Gastrointestinal:  Denies nausea, heartburn or change in bowel habits Skin: Denies abnormal skin rashes  Lymphatics: Denies new lymphadenopathy or easy bruising MSK: (+) joint pain, mild Neurological:Denies numbness, tingling or new weaknesses Behavioral/Psych: Mood is  stable, no new changes  BREAST: No new symptoms All other systems were reviewed with the patient and are negative.  PHYSICAL EXAMINATION: ECOG PERFORMANCE STATUS: 1  Vitals:   09/23/18 1313  BP: 134/82  Pulse: 66  Resp: 17  Temp: 98.4 F (36.9 C)  SpO2: 98%   Filed Weights   09/23/18 1313  Weight: 190 lb 9.6 oz (86.5 kg)    GENERAL:alert, no distress and comfortable SKIN: skin color, texture, turgor are normal, no rashes or significant lesions EYES: normal, conjunctiva are pink and non-injected, sclera clear OROPHARYNX:no exudate, no erythema and lips, buccal mucosa, and tongue normal  NECK: supple, thyroid normal size, non-tender, without nodularity LYMPH:  no palpable lymphadenopathy in the cervical, axillary or inguinal SKIN: (+) facial redness LUNGS: clear to auscultation and percussion with normal breathing effort HEART: regular rate & rhythm and no murmurs and no lower extremity edema ABDOMEN:abdomen soft, non-tender and normal bowel sounds Musculoskeletal:no cyanosis of digits and no clubbing  PSYCH: alert & oriented x 3 with fluent speech NEURO: no focal motor/sensory deficits Breasts: Breast inspection showed them to be symmetrical with no nipple discharge. Surgical scar in the left breast has healed very well. Her left breast is firmer than the right breast. No significant skin change from radiation. Palpation of the breasts and axilla revealed no obvious mass that I could appreciate. Mild lymphedema of left breast almost resolved. (+) mild left breast tenderness   LABORATORY DATA:  I have reviewed the data as listed CBC Latest Ref Rng & Units 09/23/2018  03/26/2018 11/25/2017  WBC 3.9 - 10.3 K/uL 6.7 6.9 7.4  Hemoglobin 11.6 - 15.9 g/dL 13.3 12.4 12.2  Hematocrit 34.8 - 46.6 % 39.5 37.8 37.6  Platelets 145 - 400 K/uL 315 351 335    CMP Latest Ref Rng & Units 03/26/2018 11/25/2017 02/11/2017  Glucose 70 - 140 mg/dL 115 121 97  BUN 7 - 26 mg/dL 16 10.8 10  Creatinine 0.60 - 1.10 mg/dL 0.88 0.9 0.89  Sodium 136 - 145 mmol/L 139 139 136  Potassium 3.5 - 5.1 mmol/L 3.2(L) 3.5 3.4(L)  Chloride 98 - 109 mmol/L 104 - 97(L)  CO2 22 - 29 mmol/L 25 22 26   Calcium 8.4 - 10.4 mg/dL 9.8 9.5 9.5  Total Protein 6.4 - 8.3 g/dL 7.6 7.5 -  Total Bilirubin 0.2 - 1.2 mg/dL 0.6 0.57 -  Alkaline Phos 40 - 150 U/L 118 123 -  AST 5 - 34 U/L 23 15 -  ALT 0 - 55 U/L 27 16 -   PATHOLOGY Diagnosis 02/16/2017 1. Breast, lumpectomy, Left - INVASIVE GRADE I LOBULAR CARCINOMA. - TUMOR IS ESTIMATED TO SPAN AT LEAST 2.1 CM IN GREATEST DIMENSION. 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 - INVASIVE TUMOR IS FOCALLY LESS THAN 0.1 CM TO LATERAL MARGIN BUT INKED MARGINAL SURFACE IS NEGATIVE FOR TUMOR. - INITIAL ANTERIOR MARGIN CANNOT BE EFFECTIVELY CLEARED OF INVASIVE TUMOR, PLEASE SEE SPECIMEN #10 FOR FINAL MARGIN STATUS. - SEE ONCOLOGY TEMPLATE. 2. Lymph node, sentinel, biopsy, Left Axillary - ONE BENIGN LYMPH NODE NODE WITH NO TUMOR SEEN (0/1). - SEE COMMENT. 3. Lymph node, sentinel, biopsy, Left Axillary - ONE BENIGN LYMPH NODE NODE WITH NO TUMOR SEEN (0/1). - SEE COMMENT. 4. Lymph node, sentinel, biopsy, Left Axillary - ONE BENIGN LYMPH NODE NODE WITH NO TUMOR SEEN (0/1). - SEE COMMENT. 5. Lymph node, sentinel, biopsy, Left Axillary - ONE BENIGN LYMPH NODE NODE WITH NO TUMOR SEEN (0/1). - SEE  COMMENT. 6. Breast, excision, Left additional Posterior Margin - BENIGN BREAST PARENCHYMA AND FIBROFATTY SOFT TISSUE. - NO ATYPIA OR TUMOR SEEN. 7. Breast, excision, Left additional Superior Margin - BENIGN BREAST PARENCHYMA AND FIBROFATTY  SOFT TISSUE. - NO ATYPIA OR TUMOR SEEN. 8. Breast, excision, Left additional Inferior Margin - BREAST PARENCHYMA SHOWING USUAL DUCTAL HYPERPLASIA. - INCIDENTAL HYALINIZED FIBROADENOMA WITH CALCIFICATIONS. - NO ATYPIA OR TUMOR SEEN. 9. Breast, excision, Left additional Medial Margin - BENIGN BREAST PARENCHYMA AND FIBROFATTY SOFT TISSUE. - NO ATYPIA OR TUMOR SEEN. 10. Breast, excision, Left additional Anterior Margin - BENIGN FIBROFATTY SOFT TISSUE. - NO ATYPIA OR TUMOR SEEN. Microscopic Comment 1. BREAST, INVASIVE TUMOR Procedure: Left lumpectomy with left sentinel lymph node biopsies and additional posterior, superior, medial and anterior left margin excisions. Laterality: Left. Tumor Size: At least 2.1 cm in greatest dimension, see below comment. Histologic Type: Invasive lobular carcinoma. Grade: 1. Tubular Differentiation: 3. Nuclear Pleomorphism: 1. Mitotic Count: 1. Ductal Carcinoma in Situ (DCIS): Not identified. Extent of Tumor: Tumor confined to breast parenchyma. Skin: Not received. Nipple: Not received. Skeletal muscle: Not received. Margins: Invasive carcinoma, distance from closest margin: Invasive lobular carcinoma is focally less than 0.1 cm from lateral margin; The initial anterior margin cannot be effectively cleared of invasive tumor but the additional anterior margin (specimen #10) is benign. DCIS, distance from closest margin: Ductal carcinoma in situ is not identified. Regional Lymph Nodes: Number of Lymph Nodes Examined: 4. Number of Sentinel Lymph Nodes Examined: 4. Lymph Nodes with Macrometastases: 0. Lymph Nodes with Micrometastases: 0. Lymph Nodes with Isolated Tumor Cells: 0. Breast Prognostic Profile: Performed on previous case, [id number]: Estrogen Receptor: 90%, positive. Progesterone Receptor: 90%, positive. Her-2: 1.24 ratio, negative. Ki-67: 1%. Pathologic Stage Classification (pTNM, AJCC 8th Edition): Primary Tumor (pT): pT2. Regional  Lymph Nodes (pN): pN0. Distant Metastases (pM): MX. Comments: After review of the microscopic slide sections, the gross specimen is re-evaluated. The tumor mass is grossly 1.1 cm away from the lateral margin and the combined dimension of the tumor mass added with the tissue taken at the lateral margin is at least 2.1 cm (the total tumor dimension is estimated to measure at least 2.1 cm correlating with a pT2 tumor). A cytokeratin AE1/AE3 immunohistochemical stain is performed on two blocks, 1D and 18M to assess the extent of the tumor with respect to the margins on the lumpectomy specimen. The findings are consistent with the above assessment. 2. - 5. A cytokeratin AE1/AE3 immunohistochemical stain is performed on all lymph node tissue (four stains total). The stains fail to demonstrate evidence of metastatic carcinoma. (RH:ecj 02/17/2017)   Diagnosis 01/15/2017 Breast, left, needle core biopsy, 2:00 o'clock - INVASIVE LOBULAR CARCINOMA. - ATYPICAL LOBULAR HYPERPLASIA. - MICROCALCIFICATIONS PRESENT. - SEE COMMENT. Microscopic Comment Immunohistochemical stains are performed after review of the H&E stained sections for cytokeratin AE1/AE3, calponin, smooth muscle myosin, p63 and E-Cadherin. The morphology coupled with the staining pattern is consistent with the above diagnosis. Although definitive grading of breast carcinoma is best done on excision, the features of the invasive tumor from the left breast 2 o'clock biopsy are compatible with a grade 1 breast carcinoma. Breast prognostic markers will be performed and reported in an addendum. Findings are called to the [facility] on 01/19/2017. Dr. Vicente Males has seen this case in consultation with agreement. (RH:kh 01-19-17)  PROGNOSTIC INDICATORS Estrogen Receptor: 90%, POSITIVE, STRONG STAINING INTENSITY Progesterone Receptor: 90%, POSITIVE, STRONG STAINING INTENSITY Proliferation Marker Ki67: 1% HER2 - NEGATIVE  RADIOGRAPHIC  STUDIES: I have personally reviewed the radiological images as listed and agreed with the findings in the report.   Diagnostic Mammogram 01/19/18 IMPRESSION: No mammographic evidence of breast malignancy.  Bone Density Scan 07/29/17 ASSESSMENT: The BMD measured at Femur Neck is 0.887 g/cm2 with a T-score of -1.1. This patient is considered osteopenic according to [facility]) criteria. Site Region Measured Date Measured Age YA BMD Significant CHANGE T-score Right Femur Neck   07/29/2017    61.0         -1.1    0.887 g/cm2 AP Spine    L1-L4  07/29/2017    61.0         0.2     1.216 g/cm2   ASSESSMENT & PLAN:  62 y.o. postmenopausal woman, presented with screening discovered left breast cancer.  1. Malignant neoplasm of upper-outer quadrant of left breast, invasive lobular carcinoma, grade 1, pT2N0M0, ER+/PR+/HER2-, Oncotype RS low risk -She underwent left breast lumpectomy and sentinel lymph node biopsy in Feb 2018. -I previously discussed her surgical pathology results with her. She had a computed surgical resection, sentinel lymph nodes were negative. -I previously discussed her Oncotype test results, which showed a low risk, Her estimated 10 year risk of distant recurrence is 8% with tamoxifen. Adjuvant chemotherapy is not recommended in low risk disease. -she started adjuvant letrozole in 06/2017. She has experienced joint pain and headaches. She was switched to exemestane, she is tolerating exemestane very well, with mild joint stiffness, overall much more tolerable.  She will continue for a total of 5-7 years.   -I again encouraged her to watch her diet and to be active with exercise as to combat weight gain. She is very compliant  -She is clinically doing well. Labs reviewed, CBC is WNLs and CMP is pending. Her physical exam unremarkable. Her mammogram from 01/19/18 revealed no evidence of malignancy. There is no clinical concern for recurrence.  -She follows up  with her OB/GYN and PCP once a year -F/u in 6 months    2. Hypertension, hypercholesterolemia, Hypokalemia  -She'll follow-up with her primary care physician -She takes HCTZ.   3. Arthritis -f/u with PCP   4. Osteopenia  -07/29/17 bone density shows osteopenia in femur neck with a T-Score of -1.1.  -I again disucssed anti-estrogen therapy can lessen her bone density.  -I previously encouraged her to start calcium and vitamin D.   5. Hot Flash - She states she has bad hot flashes during the day and at night. She reports that they are tolerable.  -I offered her medication previously and she felt she didn't need it. She will tell me if it becomes unbearable  6.  Hypokalemia -Her potassium was 3.2 previously, she is on hydrochlorothiazide which can cause hypokalemia -I called in potassium chloride 20 mEq once daily for 7 days, she will take potassium chloride over-the-counter or potassium enriched foods afterwards. -K 3.4 today, she will eat K rich food  Plan: -Continue Exemestane -Lab and f/u in 6 months  -Diagnostic mammogram in 12/2018  All questions were answered. The patient knows to call the clinic with any problems, questions or concerns. I spent 15 minutes counseling the patient face to face. The total time spent in the appointment was 20 minutes and more than 50% was on counseling.  Dierdre Searles Dweik am acting as scribe for Dr. Truitt [name].  I have reviewed the above documentation for accuracy and completeness, and I agree with the above.    Krista Blue  Burr Medico, MD 09/23/2018 1:25 PM

## 2018-09-23 ENCOUNTER — Inpatient Hospital Stay: Payer: Commercial Managed Care - PPO

## 2018-09-23 ENCOUNTER — Inpatient Hospital Stay: Payer: Commercial Managed Care - PPO | Attending: Hematology | Admitting: Hematology

## 2018-09-23 ENCOUNTER — Telehealth: Payer: Self-pay | Admitting: Hematology

## 2018-09-23 VITALS — BP 134/82 | HR 66 | Temp 98.4°F | Resp 17 | Ht 66.0 in | Wt 190.6 lb

## 2018-09-23 DIAGNOSIS — I10 Essential (primary) hypertension: Secondary | ICD-10-CM

## 2018-09-23 DIAGNOSIS — Z8052 Family history of malignant neoplasm of bladder: Secondary | ICD-10-CM

## 2018-09-23 DIAGNOSIS — E7800 Pure hypercholesterolemia, unspecified: Secondary | ICD-10-CM

## 2018-09-23 DIAGNOSIS — Z79811 Long term (current) use of aromatase inhibitors: Secondary | ICD-10-CM | POA: Diagnosis not present

## 2018-09-23 DIAGNOSIS — C50412 Malignant neoplasm of upper-outer quadrant of left female breast: Secondary | ICD-10-CM

## 2018-09-23 DIAGNOSIS — E876 Hypokalemia: Secondary | ICD-10-CM | POA: Insufficient documentation

## 2018-09-23 DIAGNOSIS — Z923 Personal history of irradiation: Secondary | ICD-10-CM

## 2018-09-23 DIAGNOSIS — N951 Menopausal and female climacteric states: Secondary | ICD-10-CM | POA: Diagnosis not present

## 2018-09-23 DIAGNOSIS — Z800 Family history of malignant neoplasm of digestive organs: Secondary | ICD-10-CM | POA: Diagnosis not present

## 2018-09-23 DIAGNOSIS — Z79899 Other long term (current) drug therapy: Secondary | ICD-10-CM | POA: Diagnosis not present

## 2018-09-23 DIAGNOSIS — Z170 Estrogen receptor positive status [ER+]: Secondary | ICD-10-CM

## 2018-09-23 DIAGNOSIS — M8580 Other specified disorders of bone density and structure, unspecified site: Secondary | ICD-10-CM | POA: Insufficient documentation

## 2018-09-23 DIAGNOSIS — M25512 Pain in left shoulder: Secondary | ICD-10-CM | POA: Diagnosis not present

## 2018-09-23 DIAGNOSIS — R51 Headache: Secondary | ICD-10-CM

## 2018-09-23 LAB — COMPREHENSIVE METABOLIC PANEL
ALK PHOS: 117 U/L (ref 38–126)
ALT: 15 U/L (ref 0–44)
AST: 19 U/L (ref 15–41)
Albumin: 3.9 g/dL (ref 3.5–5.0)
Anion gap: 8 (ref 5–15)
BILIRUBIN TOTAL: 1 mg/dL (ref 0.3–1.2)
BUN: 10 mg/dL (ref 6–20)
CALCIUM: 9.6 mg/dL (ref 8.9–10.3)
CHLORIDE: 97 mmol/L — AB (ref 98–111)
CO2: 31 mmol/L (ref 22–32)
CREATININE: 1.01 mg/dL — AB (ref 0.44–1.00)
GFR, EST NON AFRICAN AMERICAN: 59 mL/min — AB (ref >60)
Glucose, Bld: 88 mg/dL (ref 70–99)
Potassium: 3.4 mmol/L — ABNORMAL LOW (ref 3.5–5.1)
Sodium: 136 mmol/L (ref 135–145)
Total Protein: 7.5 g/dL (ref 6.5–8.1)

## 2018-09-23 LAB — CBC WITH DIFFERENTIAL/PLATELET
BASOS PCT: 1 %
Basophils Absolute: 0.1 10*3/uL (ref 0.0–0.1)
EOS ABS: 0.2 10*3/uL (ref 0.0–0.5)
Eosinophils Relative: 3 %
HCT: 39.5 % (ref 34.8–46.6)
Hemoglobin: 13.3 g/dL (ref 11.6–15.9)
LYMPHS ABS: 1.9 10*3/uL (ref 0.9–3.3)
Lymphocytes Relative: 28 %
MCH: 28.7 pg (ref 25.1–34.0)
MCHC: 33.7 g/dL (ref 31.5–36.0)
MCV: 85.2 fL (ref 79.5–101.0)
Monocytes Absolute: 0.7 10*3/uL (ref 0.1–0.9)
Monocytes Relative: 10 %
NEUTROS PCT: 58 %
Neutro Abs: 3.9 10*3/uL (ref 1.5–6.5)
PLATELETS: 315 10*3/uL (ref 145–400)
RBC: 4.64 MIL/uL (ref 3.70–5.45)
RDW: 14.9 % — ABNORMAL HIGH (ref 11.2–14.5)
WBC: 6.7 10*3/uL (ref 3.9–10.3)

## 2018-09-23 NOTE — Telephone Encounter (Signed)
Appts scheduled AVS/Calendar printed per 10/3 los °

## 2018-09-24 ENCOUNTER — Encounter: Payer: Self-pay | Admitting: Hematology

## 2019-01-13 ENCOUNTER — Other Ambulatory Visit: Payer: Self-pay | Admitting: Hematology

## 2019-01-20 ENCOUNTER — Ambulatory Visit
Admission: RE | Admit: 2019-01-20 | Discharge: 2019-01-20 | Disposition: A | Discharge status: Home / Self Care | Payer: Commercial Managed Care - PPO | Source: Ambulatory Visit | Attending: Hematology | Admitting: Hematology

## 2019-01-20 DIAGNOSIS — Z170 Estrogen receptor positive status [ER+]: Principal | ICD-10-CM

## 2019-01-20 DIAGNOSIS — Z853 Personal history of malignant neoplasm of breast: Secondary | ICD-10-CM | POA: Diagnosis not present

## 2019-01-20 DIAGNOSIS — C50412 Malignant neoplasm of upper-outer quadrant of left female breast: Secondary | ICD-10-CM

## 2019-01-20 DIAGNOSIS — R928 Other abnormal and inconclusive findings on diagnostic imaging of breast: Secondary | ICD-10-CM | POA: Diagnosis not present

## 2019-03-24 NOTE — Progress Notes (Signed)
[place]   [phone] Fax:([phone]) [phone]   Clinic Follow up Note   Patient Care Team: Hoyt Koch, MD as PCP - General (Internal Medicine) Delice Bison, Charlestine Massed, NP as Nurse Practitioner (Hematology and Oncology) Truitt Merle, MD as Consulting Physician (Hematology) Kyung Rudd, MD as Consulting Physician (Radiation Oncology) Rolm Bookbinder, MD as Consulting Physician (General Surgery)   I connected with Rona Ravens Desch on 03/25/2019 at  1:00 PM EDT by telephone and verified that I am speaking with the correct person using two identifiers.   I discussed the limitations, risks, security and privacy concerns of performing an evaluation and management service by telephone and the availability of in person appointments. I also discussed with the patient that there may be a patient responsible charge related to this service. The patient expressed understanding and agreed to proceed.    CHIEF COMPLAINT: Follow up left breast cancer  SUMMARY OF ONCOLOGIC HISTORY: Oncology History   Cancer Staging Malignant neoplasm of upper-outer quadrant of left breast in female, estrogen receptor positive ([place]) Staging form: Breast, AJCC 8th Edition - Clinical stage from 01/15/2017: Stage IA (cT1c, cN0, cM0, G1, ER: Positive, PR: Positive, HER2: Negative) - Signed by Truitt Merle, MD on 01/27/2017      Malignant neoplasm of upper-outer quadrant of left breast in female, estrogen receptor positive ([place])   01/15/2017 Initial Biopsy    Left breast 2:00 core needle biopsy showed invasive lobular carcinoma, atypical lobular hyperplasia. Grade 1.    01/15/2017 Receptors her2    ER 90% positive, PR 90% positive, HER-2 negative, Ki-67 1%    01/15/2017 Mammogram    Bilateral diagnostic mammogram and ultrasound showed irregular hypoechoic mass in the left breast 2:00 position, 9 cm from the nipple, measuring 1.2 x 1.0 x 0.8 cm, no evidence of malignancy within the right breast,  ultrasound of the left axilla was negative. Breast density category B    01/15/2017 Initial Diagnosis    Malignant neoplasm of upper-outer quadrant of left breast in female, estrogen receptor positive ([place])    02/16/2017 Pathology Results    Diagnosis 1. Breast, lumpectomy, Left - INVASIVE GRADE I LOBULAR CARCINOMA. - TUMOR IS ESTIMATED TO SPAN AT LEAST 2.1 CM IN GREATEST DIMENSION. 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 - INVASIVE TUMOR IS FOCALLY LESS THAN 0.1 CM TO LATERAL MARGIN BUT INKED MARGINAL SURFACE IS NEGATIVE FOR TUMOR. - INITIAL ANTERIOR MARGIN CANNOT BE EFFECTIVELY CLEARED OF INVASIVE TUMOR, PLEASE SEE SPECIMEN #10 FOR FINAL MARGIN STATUS. - SEE ONCOLOGY TEMPLATE. 2. 4 left axillary sentinel lymph nodes were negative 3. Additional resection of left posterior, superior, anterior and inferior margins were benign.     02/16/2017 Surgery    breast lumpectomy with radioactive seed and axillary sentinel lymph node biposy with Dr. Donne Hazel    02/16/2017 Oncotype testing    Testing resulted in reccurance score of 12.    02/16/2017 Pathology Results    Left breast lumpectomy showed a 2.1 cm invasive grade 1 lobular carcinoma, associated LCIS and atypical lobular hyperplasia. Surgical margins were negative. 4 sentinel lymph nodes were negative.    02/16/2017 Oncotype testing    RS 12, low risk, predicts 10 year risk of distant recurrence 8% with tamoxifen, adjuvant chemotherapy was not recommended.    03/09/2017 Imaging     IMPRESSION: Large complex fluid collection extending throughout the upper left breast, 3:00 to 9:00 axes, at posterior depth, presumed hematoma, possibly multiloculated.    03/18/2017 Surgery  Evacuation seroma left bresat with Dr. Donne Hazel     04/21/2017 - 06/08/2017 Radiation Therapy    Patient received radiation treatment with Dr. Lisbeth Renshaw    06/2017 -  Anti-estrogen oral therapy    Letrozole daily. Switch to Exemestane  in 11/2017 due to joint pain.     09/03/2017 Survivorship    Survivorship Clinic with NP     01/19/2018 Mammogram    IMPRESSION: No mammographic evidence of breast malignancy.      CURRENT THERAPY:  Letrozole 2.40m daily starting 06/2017. Due to significant joint pain she switched to Exemestane starting 11/2017.  INTERVAL HISTORY:  [name] is here for a follow up of left breast cancer. She was able to identify herself by birthdate. She was last seen by me 6 months ago. She notes she is doing well. She notes scar tissue tenderness at bra line of her left breast. She does not wear underwire. She notes she does self-breast exam with no obvious breast changes. She notes se is on exemestane and tolerating well with no issues.  She notes she been trying to lose weight and has lost 15 pounds. She goes to health clinic where she works and goes there more often than her PCP.     REVIEW OF SYSTEMS:   Constitutional: Denies fevers, chills or abnormal weight loss Eyes: Denies blurriness of vision Ears, nose, mouth, throat, and face: Denies mucositis or sore throat Respiratory: Denies cough, dyspnea or wheezes Cardiovascular: Denies palpitation, chest discomfort or lower extremity swelling Gastrointestinal:  Denies nausea, heartburn or change in bowel habits Skin: Denies abnormal skin rashes Lymphatics: Denies new lymphadenopathy or easy bruising Neurological:Denies numbness, tingling or new weaknesses Behavioral/Psych: Mood is stable, no new changes  Breast: (+) Left breast tenderness at incision site  All other systems were reviewed with the patient and are negative.  MEDICAL HISTORY:  Past Medical History:  Diagnosis Date  . Arthritis   . Breast cancer ([facility] 01/15/2017   left breast  . [place]   . GERD (gastroesophageal reflux disease)   . HLD (hyperlipidemia)   . Hypertension   . Hypothyroidism   . Obesity   . Overactive bladder   . Palpitation   . Personal history of  radiation therapy   . Thyroid disease     SURGICAL HISTORY: Past Surgical History:  Procedure Laterality Date  . bladder stem stretch  1966  . BREAST LUMPECTOMY Left 02/16/2017  . BREAST LUMPECTOMY WITH RADIOACTIVE SEED AND SENTINEL LYMPH NODE BIOPSY Left 02/16/2017   Procedure: BREAST LUMPECTOMY WITH RADIOACTIVE SEED AND AXILLARY SENTINEL LYMPH NODE BIOPSY;  Surgeon: MRolm Bookbinder MD;  [facility]  Service: General;  Laterality: Left;  . EVACUATION BREAST HEMATOMA Left 03/18/2017   Procedure: EVACUATION SEROMA LEFT BREAST;  Surgeon: MRolm Bookbinder MD;  [facility]  Service: General;  Laterality: Left;  . JOINT REPLACEMENT    . KNEE ARTHROSCOPY     LEFT  . NOSE SURGERY  1980  . SHOULDER SURGERY Left 2007  . TONSILLECTOMY  1964  . TOTAL HIP ARTHROPLASTY Left 10/23/2015   Procedure: TOTAL HIP ARTHROPLASTY ANTERIOR APPROACH;  Surgeon: PMelrose Nakayama MD;  [facility]  Service: Orthopedics;  Laterality: Left;    I have reviewed the social history and family history with the patient and they are unchanged from previous note.  ALLERGIES:  has No Known Allergies.  MEDICATIONS:  Current Outpatient Medications  Medication Sig Dispense Refill  . acetaminophen (TYLENOL)  325 MG tablet Take 650 mg by mouth every 6 (six) hours as needed for mild pain.    . clobetasol cream (TEMOVATE) 5.90 % Apply 1 application topically 2 (two) times daily as needed (itching).     Marland Kitchen exemestane (AROMASIN) 25 MG tablet TAKE 1 TABLET BY MOUTH  DAILY AFTER BREAKFAST 90 tablet 1  . hydrochlorothiazide (HYDRODIURIL) 25 MG tablet TAKE 1 TABLET BY MOUTH EVERY DAY 30 tablet 3  . levothyroxine (SYNTHROID, LEVOTHROID) 88 MCG tablet Take 75 mcg by mouth daily. Take 2 tabs for 2 days  Alternate with 44 mcg for 1 day, then so forth.  12  . metoprolol succinate (TOPROL-XL) 25 MG 24 hr tablet TAKE 1 TABLET BY MOUTH EVERY DAY (Patient taking differently: TAKE 1/2  TABLET BY MOUTH EVERY DAY) 90 tablet 3  . pantoprazole (PROTONIX) 40 MG tablet Take 1 tablet (40 mg total) by mouth daily. Pt. needs to set up follow up appt. 30 tablet 0  . simvastatin (ZOCOR) 20 MG tablet Take 20 mg by mouth daily.     No current facility-administered medications for this visit.     PHYSICAL EXAMINATION: ECOG PERFORMANCE STATUS: 0 - Asymptomatic  -No Vitals Taken today   GENERAL:alert, no distress and comfortable SKIN: skin color, texture, turgor are normal, no rashes or significant lesions EYES: normal, Conjunctiva are pink and non-injected, sclera clear OROPHARYNX:no exudate, no erythema and lips, buccal mucosa, and tongue normal  NECK: supple, thyroid normal size, non-tender, without nodularity LYMPH:  no palpable lymphadenopathy in the cervical, axillary or inguinal LUNGS: clear to auscultation and percussion with normal breathing effort HEART: regular rate & rhythm and no murmurs and no lower extremity edema ABDOMEN:abdomen soft, non-tender and normal bowel sounds Musculoskeletal:no cyanosis of digits and no clubbing  NEURO: alert & oriented x 3 with fluent speech, no focal motor/sensory deficits -Virtual Exam was deferred today   LABORATORY DATA:  I have reviewed the data as listed CBC Latest Ref Rng & Units 09/23/2018 03/26/2018 11/25/2017  WBC 3.9 - 10.3 K/uL 6.7 6.9 7.4  Hemoglobin 11.6 - 15.9 g/dL 13.3 12.4 12.2  Hematocrit 34.8 - 46.6 % 39.5 37.8 37.6  Platelets 145 - 400 K/uL 315 351 335     CMP Latest Ref Rng & Units 09/23/2018 03/26/2018 11/25/2017  Glucose 70 - 99 mg/dL 88 115 121  BUN 6 - 20 mg/dL 10 16 10.8  Creatinine 0.44 - 1.00 mg/dL 1.01(H) 0.88 0.9  Sodium 135 - 145 mmol/L 136 139 139  Potassium 3.5 - 5.1 mmol/L 3.4(L) 3.2(L) 3.5  Chloride 98 - 111 mmol/L 97(L) 104 -  CO2 22 - 32 mmol/L _0 Calcium 8.9 - 10.3 mg/dL 9.6 9.8 9.5  Total Protein 6.5 - 8.1 g/dL 7.5 7.6 7.5  Total Bilirubin 0.3 - 1.2 mg/dL 1.0 0.6 0.57  Alkaline Phos 38 -  126 U/L 117 118 123  AST 15 - 41 U/L _1 ALT 0 - 44 U/L _2 RADIOGRAPHIC STUDIES: I have personally reviewed the radiological images as listed and agreed with the findings in the report. No results found.   ASSESSMENT & PLAN:  [name] is a 64 y.o. female with   1. Malignant neoplasm of upper-outer quadrant of left breast, invasive lobular carcinoma, grade 1, pT2N0M0, ER+/PR+/HER2-, Oncotype RS low risk -She was diagnosed in 12/2016. She is s/p left breast lumpectomy and adjuvant radiation.  -I previously discussed her Oncotype test results,  which showed a low risk, Her estimated 10 year risk of distant recurrence is 8% with tamoxifen. Adjuvant chemotherapy is not recommended in low risk disease. -She started anti-estrogen therapy with letrozole in 06/2017. Due to joint pain and HA she was switched to Exemestane in 11/2017. Overall much more tolerable. She will continue for a total of 5-7 years.   -She is clinically doing well. She denies any breast changes but still has soreness at incision site. Her 12/2018 mammogram was unremarkable. There is no clinical concern for recurrence. -Continue Surveillance, Next mammogram 12/2019.  -Continue Exemestane  -F/u in 4 months   2. Hypertension, hypercholesterolemia, Hypokalemia -She'll follow-up with her primary care physician -She takes HCTZ which can casue hypokalemia  -I previously gave her short dose potassium chloride. Continue to eat potassium rich foods.  -Managed with health clinic at work and PCP.  -She has been working to lose weight so she can control this and stop some of her medication.   3. Arthritis -f/u with PCP   4. Osteopenia  -07/29/17 bone density shows osteopenia in femur neck with a T-Score of -1.1.  -I again discussed anti-estrogen therapy can lessen her bone density.  -I encouraged her to start calcium and vitamin D.  -Next DEXA due in 07/2019, order at next visit     Plan: -Continue Exemestane  -Lab and f/u in 4 months  -Order mammogram and DEXA scans at next visit   No problem-specific Assessment & Plan notes found for this encounter.   No orders of the defined types were placed in this encounter.  All questions were answered. The patient knows to call the clinic with any problems, questions or concerns. No barriers to learning was detected. I spent 10 minutes counseling the patient over the phone. The total time spent in the appointment was 15 minutes.     Truitt Merle, MD 03/25/2019   I, Joslyn Devon, am acting as scribe for Truitt Merle, MD.   I have reviewed the above documentation for accuracy and completeness, and I agree with the above.

## 2019-03-25 ENCOUNTER — Inpatient Hospital Stay: Payer: Commercial Managed Care - PPO | Attending: Hematology

## 2019-03-25 ENCOUNTER — Telehealth: Payer: Self-pay | Admitting: Hematology

## 2019-03-25 ENCOUNTER — Inpatient Hospital Stay (HOSPITAL_BASED_OUTPATIENT_CLINIC_OR_DEPARTMENT_OTHER): Payer: Commercial Managed Care - PPO | Admitting: Hematology

## 2019-03-25 DIAGNOSIS — Z923 Personal history of irradiation: Secondary | ICD-10-CM | POA: Diagnosis not present

## 2019-03-25 DIAGNOSIS — Z79811 Long term (current) use of aromatase inhibitors: Secondary | ICD-10-CM

## 2019-03-25 DIAGNOSIS — Z170 Estrogen receptor positive status [ER+]: Secondary | ICD-10-CM

## 2019-03-25 DIAGNOSIS — C50412 Malignant neoplasm of upper-outer quadrant of left female breast: Secondary | ICD-10-CM | POA: Diagnosis not present

## 2019-03-25 NOTE — Telephone Encounter (Signed)
Called and scheduled appt per 4/3 sch message.  Patient aware of appt date and time.

## 2019-07-14 ENCOUNTER — Other Ambulatory Visit: Payer: Self-pay | Admitting: Hematology

## 2019-07-25 NOTE — Progress Notes (Signed)
[place]   [phone] Fax:([phone]) [phone]   Clinic Follow up Note   Patient Care Team: Hoyt Koch, MD as PCP - General (Internal Medicine) Delice Bison, Charlestine Massed, NP as Nurse Practitioner (Hematology and Oncology) Truitt Merle, MD as Consulting Physician (Hematology) Kyung Rudd, MD as Consulting Physician (Radiation Oncology) Rolm Bookbinder, MD as Consulting Physician (General Surgery)  Date of Service:  07/28/2019  CHIEF COMPLAINT: Follow up left breast cancer  SUMMARY OF ONCOLOGIC HISTORY: Oncology History Overview Note  Cancer Staging Malignant neoplasm of upper-outer quadrant of left breast in female, estrogen receptor positive ([place]) Staging form: Breast, AJCC 8th Edition - Clinical stage from 01/15/2017: Stage IA (cT1c, cN0, cM0, G1, ER: Positive, PR: Positive, HER2: Negative) - Signed by Truitt Merle, MD on 01/27/2017    Malignant neoplasm of upper-outer quadrant of left breast in female, estrogen receptor positive ([place])  01/15/2017 Initial Biopsy   Left breast 2:00 core needle biopsy showed invasive lobular carcinoma, atypical lobular hyperplasia. Grade 1.   01/15/2017 Receptors her2   ER 90% positive, PR 90% positive, HER-2 negative, Ki-67 1%   01/15/2017 Mammogram   Bilateral diagnostic mammogram and ultrasound showed irregular hypoechoic mass in the left breast 2:00 position, 9 cm from the nipple, measuring 1.2 x 1.0 x 0.8 cm, no evidence of malignancy within the right breast, ultrasound of the left axilla was negative. Breast density category B   01/15/2017 Initial Diagnosis   Malignant neoplasm of upper-outer quadrant of left breast in female, estrogen receptor positive ([place])   02/16/2017 Pathology Results   Diagnosis 1. Breast, lumpectomy, Left - INVASIVE GRADE I LOBULAR CARCINOMA. - TUMOR IS ESTIMATED TO SPAN AT LEAST 2.1 CM IN GREATEST DIMENSION. 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 -  INVASIVE TUMOR IS FOCALLY LESS THAN 0.1 CM TO LATERAL MARGIN BUT INKED MARGINAL SURFACE IS NEGATIVE FOR TUMOR. - INITIAL ANTERIOR MARGIN CANNOT BE EFFECTIVELY CLEARED OF INVASIVE TUMOR, PLEASE SEE SPECIMEN #10 FOR FINAL MARGIN STATUS. - SEE ONCOLOGY TEMPLATE. 2. 4 left axillary sentinel lymph nodes were negative 3. Additional resection of left posterior, superior, anterior and inferior margins were benign.    02/16/2017 Surgery   breast lumpectomy with radioactive seed and axillary sentinel lymph node biposy with Dr. Donne Hazel   02/16/2017 Oncotype testing   Testing resulted in reccurance score of 12.   02/16/2017 Pathology Results   Left breast lumpectomy showed a 2.1 cm invasive grade 1 lobular carcinoma, associated LCIS and atypical lobular hyperplasia. Surgical margins were negative. 4 sentinel lymph nodes were negative.   02/16/2017 Oncotype testing   RS 12, low risk, predicts 10 year risk of distant recurrence 8% with tamoxifen, adjuvant chemotherapy was not recommended.   03/09/2017 Imaging    IMPRESSION: Large complex fluid collection extending throughout the upper left breast, 3:00 to 9:00 axes, at posterior depth, presumed hematoma, possibly multiloculated.   03/18/2017 Surgery    Evacuation seroma left bresat with Dr. Donne Hazel    04/21/2017 - 06/08/2017 Radiation Therapy   Patient received radiation treatment with Dr. Lisbeth Renshaw   06/2017 -  Anti-estrogen oral therapy   Letrozole daily. Switch to Exemestane in 11/2017 due to joint pain.    09/03/2017 Survivorship   Survivorship Clinic with NP    01/19/2018 Mammogram   IMPRESSION: No mammographic evidence of breast malignancy.      CURRENT THERAPY:  Letrozole 2.83m daily starting 06/2017. Due to significant joint pain she switched to Exemestane starting 11/2017.   INTERVAL HISTORY:  [name] is here for a follow up left breast cancer. She was last seen by me 10 months ago. She presents to the clinic alone. She notes she  is doing well and has no new issues. 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  She has is still on exemestane and tolerating well with no significant issues. She notes  Only having trigger finger recently. She notes she recently returned from the beach. She does stay active while doing yard work. She has not set up her Gyn visit.     REVIEW OF SYSTEMS:   Constitutional: Denies fevers, chills or abnormal weight loss Eyes: Denies blurriness of vision Ears, nose, mouth, throat, and face: Denies mucositis or sore throat Respiratory: Denies cough, dyspnea or wheezes Cardiovascular: Denies palpitation, chest discomfort or lower extremity swelling Gastrointestinal:  Denies nausea, heartburn or change in bowel habits Skin: Denies abnormal skin rashes Lymphatics: Denies new lymphadenopathy or easy bruising Neurological:Denies numbness, tingling or new weaknesses Behavioral/Psych: Mood is stable, no new changes  All other systems were reviewed with the patient and are negative.  MEDICAL HISTORY:  Past Medical History:  Diagnosis Date  . Arthritis   . Breast cancer ([place]) 01/15/2017   left breast  . Cancer ([place])   . GERD (gastroesophageal reflux disease)   . HLD (hyperlipidemia)   . Hypertension   . Hypothyroidism   . Obesity   . Overactive bladder   . Palpitation   . Personal history of radiation therapy   . Thyroid disease     SURGICAL HISTORY: Past Surgical History:  Procedure Laterality Date  . bladder stem stretch  1966  . BREAST LUMPECTOMY Left 02/16/2017  . BREAST LUMPECTOMY WITH RADIOACTIVE SEED AND SENTINEL LYMPH NODE BIOPSY Left 02/16/2017   Procedure: BREAST LUMPECTOMY WITH RADIOACTIVE SEED AND AXILLARY SENTINEL LYMPH NODE BIOPSY;  Surgeon: Rolm Bookbinder, MD;  Location: [facility];  Service: General;  Laterality: Left;  . EVACUATION BREAST HEMATOMA Left 03/18/2017   Procedure: EVACUATION SEROMA LEFT BREAST;   Surgeon: Rolm Bookbinder, MD;  Location: [facility];  Service: General;  Laterality: Left;  . JOINT REPLACEMENT    . KNEE ARTHROSCOPY     LEFT  . NOSE SURGERY  1980  . SHOULDER SURGERY Left 2007  . TONSILLECTOMY  1964  . TOTAL HIP ARTHROPLASTY Left 10/23/2015   Procedure: TOTAL HIP ARTHROPLASTY ANTERIOR APPROACH;  Surgeon: Melrose Nakayama, MD;  Location: [facility];  Service: Orthopedics;  Laterality: Left;    I have reviewed the social history and family history with the patient and they are unchanged from previous note.  ALLERGIES:  has No Known Allergies.  MEDICATIONS:  Current Outpatient Medications  Medication Sig Dispense Refill  . acetaminophen (TYLENOL) 325 MG tablet Take 650 mg by mouth every 6 (six) hours as needed for mild pain.    . clobetasol cream (TEMOVATE) 7.12 % Apply 1 application topically 2 (two) times daily as needed (itching).     Marland Kitchen exemestane (AROMASIN) 25 MG tablet TAKE 1 TABLET BY MOUTH  DAILY AFTER BREAKFAST 90 tablet 1  . hydrochlorothiazide (HYDRODIURIL) 25 MG tablet TAKE 1 TABLET BY MOUTH EVERY DAY 30 tablet 3  . levothyroxine (SYNTHROID, LEVOTHROID) 88 MCG tablet Take 88 mcg by mouth daily.   12  . metoprolol succinate (TOPROL-XL) 25 MG 24 hr tablet TAKE 1 TABLET BY MOUTH EVERY DAY (Patient taking differently: TAKE 1/2 TABLET BY MOUTH EVERY DAY) 90  tablet 3  . pantoprazole (PROTONIX) 40 MG tablet Take 1 tablet (40 mg total) by mouth daily. Pt. needs to set up follow up appt. 30 tablet 0  . simvastatin (ZOCOR) 20 MG tablet Take 20 mg by mouth daily.     No current facility-administered medications for this visit.     PHYSICAL EXAMINATION: ECOG PERFORMANCE STATUS: 0 - Asymptomatic  Vitals:   07/28/19 1413  BP: 127/84  Pulse: 63  Resp: 18  Temp: 98.5 F (36.9 C)  SpO2: 100%   Filed Weights   07/28/19 1413  Weight: 185 lb (83.9 kg)    GENERAL:alert, no distress and comfortable SKIN: skin color, texture, turgor are normal, no rashes  or significant lesions EYES: normal, Conjunctiva are pink and non-injected, sclera clear  NECK: supple, thyroid normal size, non-tender, without nodularity LYMPH:  no palpable lymphadenopathy in the cervical, axillary  LUNGS: clear to auscultation and percussion with normal breathing effort HEART: regular rate & rhythm and no murmurs and no lower extremity edema ABDOMEN:abdomen soft, non-tender and normal bowel sounds. No hepatomegaly  Musculoskeletal:no cyanosis of digits and no clubbing  NEURO: alert & oriented x 3 with fluent speech, no focal motor/sensory deficits BREAST: S/p left lumpectomy: Surgical incision healed well (+) very mild skin hyperpigmentation of left breast, color is returning to normal. No palpable mass, nodules or adenopathy bilaterally. Breast exam benign.   LABORATORY DATA:  I have reviewed the data as listed CBC Latest Ref Rng & Units 07/28/2019 09/23/2018 03/26/2018  WBC 4.0 - 10.5 K/uL 7.3 6.7 6.9  Hemoglobin 12.0 - 15.0 g/dL 13.2 13.3 12.4  Hematocrit 36.0 - 46.0 % 39.9 39.5 37.8  Platelets 150 - 400 K/uL 276 315 351     CMP Latest Ref Rng & Units 07/28/2019 09/23/2018 03/26/2018  Glucose 70 - 99 mg/dL 110(H) 88 115  BUN 8 - 23 mg/dL _0 Creatinine 0.44 - 1.00 mg/dL 0.97 1.01(H) 0.88  Sodium 135 - 145 mmol/L 140 136 139  Potassium 3.5 - 5.1 mmol/L 3.5 3.4(L) 3.2(L)  Chloride 98 - 111 mmol/L 103 97(L) 104  CO2 22 - 32 mmol/L _1 Calcium 8.9 - 10.3 mg/dL 9.4 9.6 9.8  Total Protein 6.5 - 8.1 g/dL 7.1 7.5 7.6  Total Bilirubin 0.3 - 1.2 mg/dL 0.7 1.0 0.6  Alkaline Phos 38 - 126 U/L 113 117 118  AST 15 - 41 U/L _2 ALT 0 - 44 U/L _3 RADIOGRAPHIC STUDIES: I have personally reviewed the radiological images as listed and agreed with the findings in the report. No results found.   ASSESSMENT & PLAN:  MAIDIE STREIGHT is a 63 y.o. female with   1. Malignant neoplasm of upper-outer quadrant of left breast, invasive lobular carcinoma, grade  1, pT2N0M0, ER+/PR+/HER2-, Oncotype RS low risk -She underwent left breast lumpectomy and sentinel lymph node biopsy in Feb 2018. -She started adjuvant letrozole in 06/2017. She experienced joint pain and headaches. She was switched to exemestane, she is tolerating exemestane very well, with mild joint stiffness, overall much more tolerable.  She will continue for a total of 5-7 years.   -She is clinically doing well. Lab reviewed, her CBC and CMP are within normal limits except BG 110. Her physical exam and her 12/2018 mammogram were unremarkable. There is no clinical concern for recurrence. -Continue surveillance. Next mammogram in 12/2019.  -Continue exemestane  -f/u in 6 months    2.  Hypertension, hypercholesterolemia, Hypothyroidism -She'll follow-up with her primary care physician -She takes HCTZ.  -She takes levothyroxine. TSH is monitor by her PCP.  -She has lost some weight. I encouraged her to continue and manage her weight well.   3. Arthritis, minimal  -f/u with PCP   4. Osteopenia  -07/29/17 bone density shows osteopenia in femur neck with a T-Score of -1.1.  -I previously discussed anti-estrogen therapy can lessen her bone density.  -I previously encouraged her to start calcium and vitamin D.  -Next DEXA in 07/2019.    Plan: -She is clinically doing well  -Continue Exemestane -Lab and f/u in 6 months  -Diagnostic mammogram in 12/2019, DEXA in 07/2019    No problem-specific Assessment & Plan notes found for this encounter.   Orders Placed This Encounter  Procedures  . DG Bone Density    Ins Pf-07/29/17_0     Standing Status:   Future    Standing Expiration Date:   07/27/2020    Order Specific Question:   Reason for Exam (SYMPTOM  OR DIAGNOSIS REQUIRED)    Answer:   screening    Order Specific Question:   Preferred imaging location?    Answer:   [facility]   All questions were answered. The patient knows to call the clinic with any problems, questions or  concerns. No barriers to learning was detected. I spent 15 minutes counseling the patient face to face. The total time spent in the appointment was 20 minutes and more than 50% was on counseling and review of test results     Truitt Merle, MD 07/28/2019   I, Joslyn Devon, am acting as scribe for Truitt Merle, MD.   I have reviewed the above documentation for accuracy and completeness, and I agree with the above.

## 2019-07-28 ENCOUNTER — Telehealth: Payer: Self-pay | Admitting: Hematology

## 2019-07-28 ENCOUNTER — Inpatient Hospital Stay: Payer: Commercial Managed Care - PPO | Attending: Hematology

## 2019-07-28 ENCOUNTER — Encounter: Payer: Self-pay | Admitting: Hematology

## 2019-07-28 ENCOUNTER — Other Ambulatory Visit: Payer: Self-pay

## 2019-07-28 ENCOUNTER — Inpatient Hospital Stay (HOSPITAL_BASED_OUTPATIENT_CLINIC_OR_DEPARTMENT_OTHER): Payer: Commercial Managed Care - PPO | Admitting: Hematology

## 2019-07-28 VITALS — BP 127/84 | HR 63 | Temp 98.5°F | Resp 18 | Ht 66.0 in | Wt 185.0 lb

## 2019-07-28 DIAGNOSIS — Z79811 Long term (current) use of aromatase inhibitors: Secondary | ICD-10-CM | POA: Insufficient documentation

## 2019-07-28 DIAGNOSIS — I10 Essential (primary) hypertension: Secondary | ICD-10-CM | POA: Diagnosis not present

## 2019-07-28 DIAGNOSIS — E039 Hypothyroidism, unspecified: Secondary | ICD-10-CM | POA: Diagnosis not present

## 2019-07-28 DIAGNOSIS — Z170 Estrogen receptor positive status [ER+]: Secondary | ICD-10-CM | POA: Diagnosis not present

## 2019-07-28 DIAGNOSIS — E785 Hyperlipidemia, unspecified: Secondary | ICD-10-CM | POA: Diagnosis not present

## 2019-07-28 DIAGNOSIS — C50412 Malignant neoplasm of upper-outer quadrant of left female breast: Secondary | ICD-10-CM

## 2019-07-28 DIAGNOSIS — E7800 Pure hypercholesterolemia, unspecified: Secondary | ICD-10-CM | POA: Diagnosis not present

## 2019-07-28 DIAGNOSIS — E669 Obesity, unspecified: Secondary | ICD-10-CM | POA: Diagnosis not present

## 2019-07-28 DIAGNOSIS — Z79899 Other long term (current) drug therapy: Secondary | ICD-10-CM | POA: Diagnosis not present

## 2019-07-28 DIAGNOSIS — Z923 Personal history of irradiation: Secondary | ICD-10-CM | POA: Insufficient documentation

## 2019-07-28 DIAGNOSIS — M8580 Other specified disorders of bone density and structure, unspecified site: Secondary | ICD-10-CM | POA: Diagnosis not present

## 2019-07-28 DIAGNOSIS — E2839 Other primary ovarian failure: Secondary | ICD-10-CM

## 2019-07-28 DIAGNOSIS — Z9221 Personal history of antineoplastic chemotherapy: Secondary | ICD-10-CM | POA: Insufficient documentation

## 2019-07-28 LAB — COMPREHENSIVE METABOLIC PANEL
ALT: 21 U/L (ref 0–44)
AST: 23 U/L (ref 15–41)
Albumin: 3.9 g/dL (ref 3.5–5.0)
Alkaline Phosphatase: 113 U/L (ref 38–126)
Anion gap: 11 (ref 5–15)
BUN: 11 mg/dL (ref 8–23)
CO2: 26 mmol/L (ref 22–32)
Calcium: 9.4 mg/dL (ref 8.9–10.3)
Chloride: 103 mmol/L (ref 98–111)
Creatinine, Ser: 0.97 mg/dL (ref 0.44–1.00)
GFR calc Af Amer: >60 mL/min (ref >60)
GFR calc non Af Amer: >60 mL/min (ref >60)
Glucose, Bld: 110 mg/dL — ABNORMAL HIGH (ref 70–99)
Potassium: 3.5 mmol/L (ref 3.5–5.1)
Sodium: 140 mmol/L (ref 135–145)
Total Bilirubin: 0.7 mg/dL (ref 0.3–1.2)
Total Protein: 7.1 g/dL (ref 6.5–8.1)

## 2019-07-28 LAB — CBC WITH DIFFERENTIAL/PLATELET
Abs Immature Granulocytes: 0.02 10*3/uL (ref 0.00–0.07)
Basophils Absolute: 0.1 10*3/uL (ref 0.0–0.1)
Basophils Relative: 1 %
Eosinophils Absolute: 0.2 10*3/uL (ref 0.0–0.5)
Eosinophils Relative: 3 %
HCT: 39.9 % (ref 36.0–46.0)
Hemoglobin: 13.2 g/dL (ref 12.0–15.0)
Immature Granulocytes: 0 %
Lymphocytes Relative: 24 %
Lymphs Abs: 1.7 10*3/uL (ref 0.7–4.0)
MCH: 28.9 pg (ref 26.0–34.0)
MCHC: 33.1 g/dL (ref 30.0–36.0)
MCV: 87.5 fL (ref 80.0–100.0)
Monocytes Absolute: 0.5 10*3/uL (ref 0.1–1.0)
Monocytes Relative: 6 %
Neutro Abs: 4.9 10*3/uL (ref 1.7–7.7)
Neutrophils Relative %: 66 %
Platelets: 276 10*3/uL (ref 150–400)
RBC: 4.56 MIL/uL (ref 3.87–5.11)
RDW: 13.2 % (ref 11.5–15.5)
WBC: 7.3 10*3/uL (ref 4.0–10.5)
nRBC: 0 % (ref 0.0–0.2)

## 2019-07-28 NOTE — Telephone Encounter (Signed)
Scheduled appt per 8/6 los.  Printed calendar per patient request.

## 2020-01-11 ENCOUNTER — Other Ambulatory Visit: Payer: Self-pay

## 2020-01-11 ENCOUNTER — Other Ambulatory Visit: Payer: Self-pay | Admitting: Hematology

## 2020-01-11 ENCOUNTER — Ambulatory Visit
Admission: RE | Admit: 2020-01-11 | Discharge: 2020-01-11 | Disposition: A | Discharge status: Home / Self Care | Payer: Commercial Managed Care - PPO | Source: Ambulatory Visit | Attending: Hematology | Admitting: Hematology

## 2020-01-11 DIAGNOSIS — E2839 Other primary ovarian failure: Secondary | ICD-10-CM

## 2020-01-11 DIAGNOSIS — Z9889 Other specified postprocedural states: Secondary | ICD-10-CM

## 2020-01-11 DIAGNOSIS — Z853 Personal history of malignant neoplasm of breast: Secondary | ICD-10-CM

## 2020-01-23 NOTE — Progress Notes (Signed)
[facility]   [phone] [phone]   Clinic Follow up Note   Patient Care Team: Hoyt Koch, MD as PCP - General (Internal Medicine) Delice Bison, Charlestine Massed, NP as Nurse Practitioner (Hematology and Oncology) Truitt Merle, MD as Consulting Physician (Hematology) Kyung Rudd, MD as Consulting Physician (Radiation Oncology) Rolm Bookbinder, MD as Consulting Physician (General Surgery)  Date of Service:  01/27/2020  CHIEF COMPLAINT: Follow up left breast cancer  SUMMARY OF ONCOLOGIC HISTORY: Oncology History Overview Note  Cancer Staging Malignant neoplasm of upper-outer quadrant of left breast in female, estrogen receptor positive ([place]) Staging form: Breast, AJCC 8th Edition - Clinical stage from 01/15/2017: Stage IA (cT1c, cN0, cM0, G1, ER: Positive, PR: Positive, HER2: Negative) - Signed by Truitt Merle, MD on 01/27/2017    Malignant neoplasm of upper-outer quadrant of left breast in female, estrogen receptor positive ([place])  01/15/2017 Initial Biopsy   Left breast 2:00 core needle biopsy showed invasive lobular carcinoma, atypical lobular hyperplasia. Grade 1.   01/15/2017 Receptors her2   ER 90% positive, PR 90% positive, HER-2 negative, Ki-67 1%   01/15/2017 Mammogram   Bilateral diagnostic mammogram and ultrasound showed irregular hypoechoic mass in the left breast 2:00 position, 9 cm from the nipple, measuring 1.2 x 1.0 x 0.8 cm, no evidence of malignancy within the right breast, ultrasound of the left axilla was negative. Breast density category B   01/15/2017 Initial Diagnosis   Malignant neoplasm of upper-outer quadrant of left breast in female, estrogen receptor positive ([place])   02/16/2017 Pathology Results   Diagnosis 1. Breast, lumpectomy, Left - INVASIVE GRADE I LOBULAR CARCINOMA. - TUMOR IS ESTIMATED TO SPAN AT LEAST 2.1 CM IN GREATEST DIMENSION. 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 -  INVASIVE TUMOR IS FOCALLY LESS THAN 0.1 CM TO LATERAL MARGIN BUT INKED MARGINAL SURFACE IS NEGATIVE FOR TUMOR. - INITIAL ANTERIOR MARGIN CANNOT BE EFFECTIVELY CLEARED OF INVASIVE TUMOR, PLEASE SEE SPECIMEN #10 FOR FINAL MARGIN STATUS. - SEE ONCOLOGY TEMPLATE. 2. 4 left axillary sentinel lymph nodes were negative 3. Additional resection of left posterior, superior, anterior and inferior margins were benign.    02/16/2017 Surgery   breast lumpectomy with radioactive seed and axillary sentinel lymph node biposy with Dr. Donne Hazel   02/16/2017 Oncotype testing   Testing resulted in reccurance score of 12.   02/16/2017 Pathology Results   Left breast lumpectomy showed a 2.1 cm invasive grade 1 lobular carcinoma, associated LCIS and atypical lobular hyperplasia. Surgical margins were negative. 4 sentinel lymph nodes were negative.   02/16/2017 Oncotype testing   RS 12, low risk, predicts 10 year risk of distant recurrence 8% with tamoxifen, adjuvant chemotherapy was not recommended.   03/09/2017 Imaging    IMPRESSION: Large complex fluid collection extending throughout the upper left breast, 3:00 to 9:00 axes, at posterior depth, presumed hematoma, possibly multiloculated.   03/18/2017 Surgery    Evacuation seroma left bresat with Dr. Donne Hazel    04/21/2017 - 06/08/2017 Radiation Therapy   Patient received radiation treatment with Dr. Lisbeth Renshaw   06/2017 -  Anti-estrogen oral therapy   Letrozole daily. Switch to Exemestane in 11/2017 due to joint pain.    09/03/2017 Survivorship   Survivorship Clinic with NP    01/19/2018 Mammogram   IMPRESSION: No mammographic evidence of breast malignancy.      CURRENT THERAPY:  Letrozole 2.40m daily starting 06/2017. Due to significant joint pain she switched to Exemestane starting 11/2017.   INTERVAL HISTORY:  [name] is here for a follow up of left breast cancer. She was last seen by me 6 months ago. She presents to the clinic alone. She notes  she is doing well. She notes she has been having tightness spasms behind her left breast daily. This occurs with certain movements and will last a minute. This has been going on for 5-6 months.  She notes she has been tolerating Exemestane well with stable joint pain/stiffness and hot flashes.   She noted she lost her job last month and she has been taking care of her mother at home. She notes she has been stressed.     REVIEW OF SYSTEMS:   Constitutional: Denies fevers, chills or abnormal weight loss (+) manageable hot flashes  Eyes: Denies blurriness of vision Ears, nose, mouth, throat, and face: Denies mucositis or sore throat Respiratory: Denies cough, dyspnea or wheezes Cardiovascular: Denies palpitation, chest discomfort or lower extremity swelling Gastrointestinal:  Denies nausea, heartburn or change in bowel habits Skin: Denies abnormal skin rashes MSK: (+) Joint pain/stiffness stable and mainly in her left hand  Lymphatics: Denies new lymphadenopathy or easy bruising Neurological:Denies numbness, tingling or new weaknesses Behavioral/Psych: Mood is stable, no new changes (+) stress Breast: (+) Spasms behind left breast All other systems were reviewed with the patient and are negative.  MEDICAL HISTORY:  Past Medical History:  Diagnosis Date  . Arthritis   . Breast cancer ([place]) 01/15/2017   left breast  . Cancer ([place])   . GERD (gastroesophageal reflux disease)   . HLD (hyperlipidemia)   . Hypertension   . Hypothyroidism   . Obesity   . Overactive bladder   . Palpitation   . Personal history of radiation therapy   . Thyroid disease     SURGICAL HISTORY: Past Surgical History:  Procedure Laterality Date  . bladder stem stretch  1966  . BREAST LUMPECTOMY Left 02/16/2017  . BREAST LUMPECTOMY WITH RADIOACTIVE SEED AND SENTINEL LYMPH NODE BIOPSY Left 02/16/2017   Procedure: BREAST LUMPECTOMY WITH RADIOACTIVE SEED AND AXILLARY SENTINEL LYMPH NODE BIOPSY;  Surgeon:  Rolm Bookbinder, MD;  Location: [facility];  Service: General;  Laterality: Left;  . EVACUATION BREAST HEMATOMA Left 03/18/2017   Procedure: EVACUATION SEROMA LEFT BREAST;  Surgeon: Rolm Bookbinder, MD;  Location: [facility];  Service: General;  Laterality: Left;  . JOINT REPLACEMENT    . KNEE ARTHROSCOPY     LEFT  . NOSE SURGERY  1980  . SHOULDER SURGERY Left 2007  . TONSILLECTOMY  1964  . TOTAL HIP ARTHROPLASTY Left 10/23/2015   Procedure: TOTAL HIP ARTHROPLASTY ANTERIOR APPROACH;  Surgeon: Melrose Nakayama, MD;  Location: [facility];  Service: Orthopedics;  Laterality: Left;    I have reviewed the social history and family history with the patient and they are unchanged from previous note.  ALLERGIES:  has No Known Allergies.  MEDICATIONS:  Current Outpatient Medications  Medication Sig Dispense Refill  . acetaminophen (TYLENOL) 325 MG tablet Take 650 mg by mouth every 6 (six) hours as needed for mild pain.    . clobetasol cream (TEMOVATE) 5.30 % Apply 1 application topically 2 (two) times daily as needed (itching).     Marland Kitchen exemestane (AROMASIN) 25 MG tablet Take 1 tablet (25 mg total) by mouth daily after breakfast. 90 tablet 1  . hydrochlorothiazide (HYDRODIURIL) 25 MG tablet TAKE 1 TABLET BY MOUTH EVERY DAY 30 tablet 3  . levothyroxine (SYNTHROID) 75 MCG tablet Take 75 mcg by  mouth daily.    . metoprolol succinate (TOPROL-XL) 25 MG 24 hr tablet TAKE 1 TABLET BY MOUTH EVERY DAY (Patient taking differently: TAKE 1/2 TABLET BY MOUTH EVERY DAY) 90 tablet 3  . pantoprazole (PROTONIX) 40 MG tablet Take 1 tablet (40 mg total) by mouth daily. Pt. needs to set up follow up appt. 30 tablet 0  . simvastatin (ZOCOR) 20 MG tablet Take 20 mg by mouth daily.     No current facility-administered medications for this visit.    PHYSICAL EXAMINATION: ECOG PERFORMANCE STATUS: 0 - Asymptomatic  Vitals:   01/27/20 0849  BP: 137/78  Pulse: 60  Resp: 17  Temp: 98.7 F  (37.1 C)  SpO2: 100%   Filed Weights   01/27/20 0849  Weight: 200 lb 11.2 oz (91 kg)    GENERAL:alert, no distress and comfortable SKIN: skin color, texture, turgor are normal, no rashes or significant lesions EYES: normal, Conjunctiva are pink and non-injected, sclera clear  NECK: supple, thyroid normal size, non-tender, without nodularity LYMPH:  no palpable lymphadenopathy in the cervical, axillary  LUNGS: clear to auscultation and percussion with normal breathing effort HEART: regular rate & rhythm and no murmurs and no lower extremity edema ABDOMEN:abdomen soft, non-tender and normal bowel sounds Musculoskeletal:no cyanosis of digits and no clubbing  NEURO: alert & oriented x 3 with fluent speech, no focal motor/sensory deficits BREAST: S/p left lumpectomy: Surgical incision healed well. (+) Skin erythema of b/l skin fold under breast. No palpable mass, nodules or adenopathy bilaterally. Breast exam benign.   LABORATORY DATA:  I have reviewed the data as listed CBC Latest Ref Rng & Units 01/27/2020 07/28/2019 09/23/2018  WBC 4.0 - 10.5 K/uL 6.3 7.3 6.7  Hemoglobin 12.0 - 15.0 g/dL 14.6 13.2 13.3  Hematocrit 36.0 - 46.0 % 43.7 39.9 39.5  Platelets 150 - 400 K/uL 302 276 315     CMP Latest Ref Rng & Units 01/27/2020 07/28/2019 09/23/2018  Glucose 70 - 99 mg/dL 104(H) 110(H) 88  BUN 8 - 23 mg/dL 13 11 10   Creatinine 0.44 - 1.00 mg/dL 1.07(H) 0.97 1.01(H)  Sodium 135 - 145 mmol/L 140 140 136  Potassium 3.5 - 5.1 mmol/L 4.0 3.5 3.4(L)  Chloride 98 - 111 mmol/L 104 103 97(L)  CO2 22 - 32 mmol/L 27 26 31   Calcium 8.9 - 10.3 mg/dL 9.5 9.4 9.6  Total Protein 6.5 - 8.1 g/dL 7.8 7.1 7.5  Total Bilirubin 0.3 - 1.2 mg/dL 0.7 0.7 1.0  Alkaline Phos 38 - 126 U/L 107 113 117  AST 15 - 41 U/L 37 23 19  ALT 0 - 44 U/L 46(H) 21 15      RADIOGRAPHIC STUDIES: I have personally reviewed the radiological images as listed and agreed with the findings in the report. No results found.    ASSESSMENT & PLAN:  [name] is a 64 y.o. female with    1. Malignant neoplasm of upper-outer quadrant of left breast, invasive lobular carcinoma, grade 1, pT2N0M0, ER+/PR+/HER2-, Oncotype RS low risk -She underwent left breast lumpectomy and sentinel lymph node biopsy in Feb 2018. -She started adjuvant letrozole in 06/2017. She experienced joint pain and headaches. She wasswitchedto exemestane, she is tolerating exemestane very well, with mild joint stiffness, overall much more tolerable. She will continue for a total of 5-7 years.  -She notes she has been having spasms behind her left breast. Labs reviewed, CBC and CMP WNL. Physical exam unremarkable with skin erythema under her breasts. I encouraged her  to keep that area dry. Her 12/2018 mammogram was normal. There is no clinic concern for recurrence.  -I reviewed exercise and medication (neurontin or cymbalta) for her left breast pain, she will do exercise first  -Continue surveillance. Next mammogram on 02/10/20 -Continue exemestane  -Phone visit in 4 months and OV in 8 months    2. Left breast/chest spasms  -Has been ongoing daily for the last 5-6 months and lasts 1 minute.  -Likely related to her breast surgeries. She did complete PT -I recommend she continue PT exercises. If this does not improve I can call in Cymbalta which can help her nerve pain and mood.    3. Hypertension, hypercholesterolemia, Hypothyroidism -She'll follow-up with her primary care physician -She takes HCTZ, levothyroxine.   4. Arthritis, minimal  -f/u with PCP   5. Osteopenia  -07/29/17 bone density shows osteopenia in femur neck with a T-Score of -1.1.  -I previously discussed anti-estrogen therapy can lessen her bone density.  -Her 12/2019 DEXA shows slightly worsened osteopenia with lowest T-score -1.3 of femur neck..  -I recommended her to start OTC calcium 600-1267m and vitamin D 1000-2000 units.    6. Financial and social support -She  lost her job in 12/2019. Her insurance was included in her severance package which is temporary. She hopes to find another before its end date.  -She has been taking care of her mother at home.  -I will see if she can get medication assistance for Exemestane if needed.     Plan: -Continue Exemestane, she is tolerating well  -Mammogram on 02/10/20, scheduled  -she will exercise for her left breast pain  -Phone visit in 4 months -Lab and f/u in 8 months    No problem-specific Assessment & Plan notes found for this encounter.   No orders of the defined types were placed in this encounter.  All questions were answered. The patient knows to call the clinic with any problems, questions or concerns. No barriers to learning was detected. The total time spent in the appointment was 25 minutes.     YTruitt Merle MD 01/27/2020   I, AJoslyn Devon am acting as scribe for YTruitt Merle MD.   I have reviewed the above documentation for accuracy and completeness, and I agree with the above.

## 2020-01-27 ENCOUNTER — Encounter: Payer: Self-pay | Admitting: Hematology

## 2020-01-27 ENCOUNTER — Encounter: Payer: Self-pay | Admitting: Internal Medicine

## 2020-01-27 ENCOUNTER — Ambulatory Visit: Payer: Managed Care, Other (non HMO) | Admitting: Internal Medicine

## 2020-01-27 ENCOUNTER — Other Ambulatory Visit: Payer: Self-pay

## 2020-01-27 ENCOUNTER — Inpatient Hospital Stay: Payer: Managed Care, Other (non HMO) | Attending: Hematology

## 2020-01-27 ENCOUNTER — Inpatient Hospital Stay (HOSPITAL_BASED_OUTPATIENT_CLINIC_OR_DEPARTMENT_OTHER): Payer: Managed Care, Other (non HMO) | Admitting: Hematology

## 2020-01-27 VITALS — BP 122/78 | HR 70 | Temp 98.3°F | Ht 66.0 in | Wt 200.0 lb

## 2020-01-27 VITALS — BP 137/78 | HR 60 | Temp 98.7°F | Resp 17 | Ht 66.0 in | Wt 200.7 lb

## 2020-01-27 DIAGNOSIS — Z79899 Other long term (current) drug therapy: Secondary | ICD-10-CM | POA: Diagnosis not present

## 2020-01-27 DIAGNOSIS — M8580 Other specified disorders of bone density and structure, unspecified site: Secondary | ICD-10-CM | POA: Insufficient documentation

## 2020-01-27 DIAGNOSIS — C50412 Malignant neoplasm of upper-outer quadrant of left female breast: Secondary | ICD-10-CM | POA: Diagnosis present

## 2020-01-27 DIAGNOSIS — Z0000 Encounter for general adult medical examination without abnormal findings: Secondary | ICD-10-CM

## 2020-01-27 DIAGNOSIS — Z170 Estrogen receptor positive status [ER+]: Secondary | ICD-10-CM | POA: Diagnosis not present

## 2020-01-27 DIAGNOSIS — E039 Hypothyroidism, unspecified: Secondary | ICD-10-CM | POA: Insufficient documentation

## 2020-01-27 DIAGNOSIS — E785 Hyperlipidemia, unspecified: Secondary | ICD-10-CM | POA: Insufficient documentation

## 2020-01-27 DIAGNOSIS — Z923 Personal history of irradiation: Secondary | ICD-10-CM | POA: Insufficient documentation

## 2020-01-27 DIAGNOSIS — M1990 Unspecified osteoarthritis, unspecified site: Secondary | ICD-10-CM | POA: Diagnosis not present

## 2020-01-27 DIAGNOSIS — I10 Essential (primary) hypertension: Secondary | ICD-10-CM | POA: Diagnosis not present

## 2020-01-27 DIAGNOSIS — Z79811 Long term (current) use of aromatase inhibitors: Secondary | ICD-10-CM | POA: Insufficient documentation

## 2020-01-27 DIAGNOSIS — E7800 Pure hypercholesterolemia, unspecified: Secondary | ICD-10-CM | POA: Insufficient documentation

## 2020-01-27 DIAGNOSIS — E782 Mixed hyperlipidemia: Secondary | ICD-10-CM

## 2020-01-27 LAB — CBC WITH DIFFERENTIAL/PLATELET
Abs Immature Granulocytes: 0.02 10*3/uL (ref 0.00–0.07)
Basophils Absolute: 0.1 10*3/uL (ref 0.0–0.1)
Basophils Relative: 1 %
Eosinophils Absolute: 0.2 10*3/uL (ref 0.0–0.5)
Eosinophils Relative: 3 %
HCT: 43.7 % (ref 36.0–46.0)
Hemoglobin: 14.6 g/dL (ref 12.0–15.0)
Immature Granulocytes: 0 %
Lymphocytes Relative: 26 %
Lymphs Abs: 1.6 10*3/uL (ref 0.7–4.0)
MCH: 30.3 pg (ref 26.0–34.0)
MCHC: 33.4 g/dL (ref 30.0–36.0)
MCV: 90.7 fL (ref 80.0–100.0)
Monocytes Absolute: 0.6 10*3/uL (ref 0.1–1.0)
Monocytes Relative: 10 %
Neutro Abs: 3.8 10*3/uL (ref 1.7–7.7)
Neutrophils Relative %: 60 %
Platelets: 302 10*3/uL (ref 150–400)
RBC: 4.82 MIL/uL (ref 3.87–5.11)
RDW: 13 % (ref 11.5–15.5)
WBC: 6.3 10*3/uL (ref 4.0–10.5)
nRBC: 0 % (ref 0.0–0.2)

## 2020-01-27 LAB — COMPREHENSIVE METABOLIC PANEL
ALT: 46 U/L — ABNORMAL HIGH (ref 0–44)
AST: 37 U/L (ref 15–41)
Albumin: 3.9 g/dL (ref 3.5–5.0)
Alkaline Phosphatase: 107 U/L (ref 38–126)
Anion gap: 9 (ref 5–15)
BUN: 13 mg/dL (ref 8–23)
CO2: 27 mmol/L (ref 22–32)
Calcium: 9.5 mg/dL (ref 8.9–10.3)
Chloride: 104 mmol/L (ref 98–111)
Creatinine, Ser: 1.07 mg/dL — ABNORMAL HIGH (ref 0.44–1.00)
GFR calc Af Amer: >60 mL/min (ref >60)
GFR calc non Af Amer: 56 mL/min — ABNORMAL LOW (ref >60)
Glucose, Bld: 104 mg/dL — ABNORMAL HIGH (ref 70–99)
Potassium: 4 mmol/L (ref 3.5–5.1)
Sodium: 140 mmol/L (ref 135–145)
Total Bilirubin: 0.7 mg/dL (ref 0.3–1.2)
Total Protein: 7.8 g/dL (ref 6.5–8.1)

## 2020-01-27 LAB — TSH: TSH: 1.43 u[IU]/mL (ref 0.35–4.50)

## 2020-01-27 LAB — LIPID PANEL
Cholesterol: 168 mg/dL (ref 0–200)
HDL: 60.2 mg/dL (ref >39.00)
LDL Cholesterol: 92 mg/dL (ref 0–99)
NonHDL: 107.65
Total CHOL/HDL Ratio: 3
Triglycerides: 77 mg/dL (ref 0.0–149.0)
VLDL: 15.4 mg/dL (ref 0.0–40.0)

## 2020-01-27 LAB — T4, FREE: Free T4: 1.1 ng/dL (ref 0.60–1.60)

## 2020-01-27 MED ORDER — EXEMESTANE 25 MG PO TABS: 25.0000 mg | ORAL_TABLET | Freq: Every day | ORAL | 1 refills | Status: DC

## 2020-01-27 MED ORDER — CLOBETASOL PROPIONATE 0.05 % EX CREA: 1.0000 "application " | TOPICAL_CREAM | Freq: Two times a day (BID) | CUTANEOUS | 6 refills | Status: DC | PRN

## 2020-01-27 MED ORDER — LEVOTHYROXINE SODIUM 75 MCG PO TABS: 75.0000 ug | ORAL_TABLET | Freq: Every day | ORAL | 3 refills | Status: DC

## 2020-01-27 MED ORDER — PANTOPRAZOLE SODIUM 40 MG PO TBEC: 40.0000 mg | DELAYED_RELEASE_TABLET | Freq: Every day | ORAL | 3 refills | Status: DC

## 2020-01-27 MED ORDER — METOPROLOL SUCCINATE ER 25 MG PO TB24: 25.0000 mg | ORAL_TABLET | Freq: Every day | ORAL | 3 refills | Status: DC

## 2020-01-27 MED ORDER — HYDROCHLOROTHIAZIDE 25 MG PO TABS: 25.0000 mg | ORAL_TABLET | Freq: Every day | ORAL | 3 refills | Status: DC

## 2020-01-27 MED ORDER — SIMVASTATIN 20 MG PO TABS: 20.0000 mg | ORAL_TABLET | Freq: Every day | ORAL | 3 refills | Status: DC

## 2020-01-27 NOTE — Assessment & Plan Note (Signed)
Checking TSH and free T4 and adjust synthroid 75 mcg daily as needed.  

## 2020-01-27 NOTE — Assessment & Plan Note (Signed)
BP at goal on metoprolol which is refilled today.

## 2020-01-27 NOTE — Progress Notes (Signed)
   Subjective:   Patient ID: [name], female    DOB: 1956-01-15, 64 y.o.   MRN: [id number]  HPI The patient is a 64 YO female coming in for physical.   PMH, [place], social history reviewed and updated  Review of Systems  Constitutional: Negative.   HENT: Negative.   Eyes: Negative.   Respiratory: Negative for cough, chest tightness and shortness of breath.   Cardiovascular: Negative for chest pain, palpitations and leg swelling.  Gastrointestinal: Negative for abdominal distention, abdominal pain, constipation, diarrhea, nausea and vomiting.  Musculoskeletal: Negative.   Skin: Negative.   Neurological: Negative.   Psychiatric/Behavioral: Negative.     Objective:  Physical Exam Constitutional:      Appearance: She is well-developed. She is obese.  HENT:     Head: Normocephalic and atraumatic.  Cardiovascular:     Rate and Rhythm: Normal rate and regular rhythm.  Pulmonary:     Effort: Pulmonary effort is normal. No respiratory distress.     Breath sounds: Normal breath sounds. No wheezing or rales.  Abdominal:     General: Bowel sounds are normal. There is no distension.     Palpations: Abdomen is soft.     Tenderness: There is no abdominal tenderness. There is no rebound.  Musculoskeletal:     Cervical back: Normal range of motion.  Skin:    General: Skin is warm and dry.  Neurological:     Mental Status: She is alert and oriented to person, place, and time.     Coordination: Coordination normal.     Vitals:   01/27/20 1038  BP: 122/78  Pulse: 70  Temp: 98.3 F (36.8 C)  TempSrc: Oral  SpO2: 98%  Weight: 200 lb (90.7 kg)  Height: 5\' 6"  (1.676 m)   EKG: Rate 63, axis normal, interval normal, sinus, no st or t wave changes, no significant change when compared to 2018  This visit occurred during the SARS-CoV-2 public health emergency.  Safety protocols were in place, including screening questions prior to the visit, additional usage of staff PPE, and extensive  cleaning of exam room while observing appropriate contact time as indicated for disinfecting solutions.   Assessment & Plan:

## 2020-01-27 NOTE — Assessment & Plan Note (Signed)
Flu shot up to date. Shingrix counseled. Tetanus declined. Colonoscopy up to date. Mammogram up to date, pap smear up to date. Counseled about sun safety and mole surveillance. Counseled about the dangers of distracted driving. Given 10 year screening recommendations.

## 2020-01-27 NOTE — Assessment & Plan Note (Signed)
Checking lipid panel and adjust simvastatin 20 mg daily as needed.  

## 2020-01-27 NOTE — Patient Instructions (Addendum)
We have done the EKG today which is normal and unchanged from before and the labs.   We have sent in the refills of your medicines.   Health Maintenance, Female Adopting a healthy lifestyle and getting preventive care are important in promoting health and wellness. Ask your health care provider about:  The right schedule for you to have regular tests and exams.  Things you can do on your own to prevent diseases and keep yourself healthy. What should I know about diet, weight, and exercise? Eat a healthy diet   Eat a diet that includes plenty of vegetables, fruits, low-fat dairy products, and lean protein.  Do not eat a lot of foods that are high in solid fats, added sugars, or sodium. Maintain a healthy weight Body mass index (BMI) is used to identify weight problems. It estimates body fat based on height and weight. Your health care provider can help determine your BMI and help you achieve or maintain a healthy weight. Get regular exercise Get regular exercise. This is one of the most important things you can do for your health. Most adults should:  Exercise for at least 150 minutes each week. The exercise should increase your heart rate and make you sweat (moderate-intensity exercise).  Do strengthening exercises at least twice a week. This is in addition to the moderate-intensity exercise.  Spend less time sitting. Even light physical activity can be beneficial. Watch cholesterol and blood lipids Have your blood tested for lipids and cholesterol at 62 years of age, then have this test every 5 years. Have your cholesterol levels checked more often if:  Your lipid or cholesterol levels are high.  You are older than 62 years of age.  You are at high risk for heart disease. What should I know about cancer screening? Depending on your health history and family history, you may need to have cancer screening at various ages. This may include screening for:  Breast cancer.   Cervical cancer.  Colorectal cancer.  Skin cancer.  Lung cancer. What should I know about heart disease, diabetes, and high blood pressure? Blood pressure and heart disease  High blood pressure causes heart disease and increases the risk of stroke. This is more likely to develop in people who have high blood pressure readings, are of African descent, or are overweight.  Have your blood pressure checked: ? Every 3-5 years if you are 39-30 years of age. ? Every year if you are 14 years old or older. Diabetes Have regular diabetes screenings. This checks your fasting blood sugar level. Have the screening done:  Once every three years after age 33 if you are at a normal weight and have a low risk for diabetes.  More often and at a younger age if you are overweight or have a high risk for diabetes. What should I know about preventing infection? Hepatitis B If you have a higher risk for hepatitis B, you should be screened for this virus. Talk with your health care provider to find out if you are at risk for hepatitis B infection. Hepatitis C Testing is recommended for:  Everyone born from [birth year] through 1963.  Anyone with known risk factors for hepatitis C. Sexually transmitted infections (STIs)  Get screened for STIs, including gonorrhea and chlamydia, if: ? You are sexually active and are younger than 62 years of age. ? You are older than 62 years of age and your health care provider tells you that you are at risk for this type  of infection. ? Your sexual activity has changed since you were last screened, and you are at increased risk for chlamydia or gonorrhea. Ask your health care provider if you are at risk.  Ask your health care provider about whether you are at high risk for HIV. Your health care provider may recommend a prescription medicine to help prevent HIV infection. If you choose to take medicine to prevent HIV, you should first get tested for HIV. You should then be tested  every 3 months for as long as you are taking the medicine. Pregnancy  If you are about to stop having your period (premenopausal) and you may become pregnant, seek counseling before you get pregnant.  Take 400 to 800 micrograms (mcg) of folic acid every day if you become pregnant.  Ask for birth control (contraception) if you want to prevent pregnancy. Osteoporosis and menopause Osteoporosis is a disease in which the bones lose minerals and strength with aging. This can result in bone fractures. If you are 66 years old or older, or if you are at risk for osteoporosis and fractures, ask your health care provider if you should:  Be screened for bone loss.  Take a calcium or vitamin D supplement to lower your risk of fractures.  Be given hormone replacement therapy (HRT) to treat symptoms of menopause. Follow these instructions at home: Lifestyle  Do not use any products that contain nicotine or tobacco, such as cigarettes, e-cigarettes, and chewing tobacco. If you need help quitting, ask your health care provider.  Do not use street drugs.  Do not share needles.  Ask your health care provider for help if you need support or information about quitting drugs. Alcohol use  Do not drink alcohol if: ? Your health care provider tells you not to drink. ? You are pregnant, may be pregnant, or are planning to become pregnant.  If you drink alcohol: ? Limit how much you use to 0-1 drink a day. ? Limit intake if you are breastfeeding.  Be aware of how much alcohol is in your drink. In the [place] one drink equals one 12 oz bottle of beer (355 mL), one 5 oz glass of wine (148 mL), or one 1 oz glass of hard liquor (44 mL). General instructions  Schedule regular health, dental, and eye exams.  Stay current with your vaccines.  Tell your health care provider if: ? You often feel depressed. ? You have ever been abused or do not feel safe at home. Summary  Adopting a healthy lifestyle and  getting preventive care are important in promoting health and wellness.  Follow your health care provider's instructions about healthy diet, exercising, and getting tested or screened for diseases.  Follow your health care provider's instructions on monitoring your cholesterol and blood pressure. This information is not intended to replace advice given to you by your health care provider. Make sure you discuss any questions you have with your health care provider. Document Revised: 12/01/2018 Document Reviewed: 12/01/2018 Elsevier Patient Education  2020 [employer].

## 2020-02-23 ENCOUNTER — Other Ambulatory Visit: Payer: Self-pay

## 2020-02-23 ENCOUNTER — Ambulatory Visit
Admission: RE | Admit: 2020-02-23 | Discharge: 2020-02-23 | Disposition: A | Discharge status: Home / Self Care | Payer: Managed Care, Other (non HMO) | Source: Ambulatory Visit | Attending: Hematology | Admitting: Hematology

## 2020-02-23 DIAGNOSIS — Z853 Personal history of malignant neoplasm of breast: Secondary | ICD-10-CM

## 2020-02-23 DIAGNOSIS — Z9889 Other specified postprocedural states: Secondary | ICD-10-CM

## 2020-05-24 NOTE — Progress Notes (Signed)
[name]   [phone] Fax:(336) [phone]   Clinic Follow up Note   Patient Care Team: Mallory Koch, MD as PCP - General (Internal Medicine) [name], Mallory Massed, NP as Nurse Practitioner (Hematology and Oncology) Mallory Merle, MD as Consulting Physician (Hematology) Mallory Rudd, MD as Consulting Physician (Radiation Oncology) Mallory Bookbinder, MD as Consulting Physician (General Surgery)  I connected with [name] on 05/25/2020 at  1:00 PM EDT by telephone visit and verified that I am speaking with the correct person using two identifiers.  I discussed the limitations, risks, security and privacy concerns of performing an evaluation and management service by telephone and the availability of in person appointments. I also discussed with the patient that there may be a patient responsible charge related to this service. The patient expressed understanding and agreed to proceed.   Other persons participating in the visit and their role in the encounter:  None   Patient's location:  In her car Provider's location:  My Office   CHIEF COMPLAINT: Follow up left breast cancer  SUMMARY OF ONCOLOGIC HISTORY: Oncology History Overview Note  Cancer Staging Malignant neoplasm of upper-outer quadrant of left breast in female, estrogen receptor positive ([place]) Staging form: Breast, AJCC 8th Edition - Clinical stage from 01/15/2017: Stage IA (cT1c, cN0, cM0, G1, ER: Positive, PR: Positive, HER2: Negative) - Signed by Mallory Merle, MD on 01/27/2017    Malignant neoplasm of upper-outer quadrant of left breast in female, estrogen receptor positive ([place])  01/15/2017 Initial Biopsy   Left breast 2:00 core needle biopsy showed invasive lobular carcinoma, atypical lobular hyperplasia. Grade 1.   01/15/2017 Receptors her2   ER 90% positive, PR 90% positive, HER-2 negative, Ki-67 1%   01/15/2017 Mammogram   Bilateral diagnostic mammogram and ultrasound showed irregular  hypoechoic mass in the left breast 2:00 position, 9 cm from the nipple, measuring 1.2 x 1.0 x 0.8 cm, no evidence of malignancy within the right breast, ultrasound of the left axilla was negative. Breast density category B   01/15/2017 Initial Diagnosis   Malignant neoplasm of upper-outer quadrant of left breast in female, estrogen receptor positive ([place])   02/16/2017 Pathology Results   Diagnosis 1. Breast, lumpectomy, Left - INVASIVE GRADE I LOBULAR CARCINOMA. - TUMOR IS ESTIMATED TO SPAN AT LEAST 2.1 CM IN GREATEST DIMENSION. 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 - INVASIVE TUMOR IS FOCALLY LESS THAN 0.1 CM TO LATERAL MARGIN BUT INKED MARGINAL SURFACE IS NEGATIVE FOR TUMOR. - INITIAL ANTERIOR MARGIN CANNOT BE EFFECTIVELY CLEARED OF INVASIVE TUMOR, PLEASE SEE SPECIMEN #10 FOR FINAL MARGIN STATUS. - SEE ONCOLOGY TEMPLATE. 2. 4 left axillary sentinel lymph nodes were negative 3. Additional resection of left posterior, superior, anterior and inferior margins were benign.    02/16/2017 Surgery   breast lumpectomy with radioactive seed and axillary sentinel lymph node biposy with [name]   02/16/2017 Oncotype testing   Testing resulted in reccurance score of 12.   02/16/2017 Pathology Results   Left breast lumpectomy showed a 2.1 cm invasive grade 1 lobular carcinoma, associated LCIS and atypical lobular hyperplasia. Surgical margins were negative. 4 sentinel lymph nodes were negative.   02/16/2017 Oncotype testing   RS 12, low risk, predicts 10 year risk of distant recurrence 8% with tamoxifen, adjuvant chemotherapy was not recommended.   03/09/2017 Imaging    IMPRESSION: Large complex fluid collection extending throughout the upper left breast, 3:00 to 9:00 axes, at posterior depth, presumed hematoma, possibly  multiloculated.   03/18/2017 Surgery    Evacuation seroma left bresat with [name]    04/21/2017 - 06/08/2017 Radiation Therapy    Patient received radiation treatment with Dr. Lisbeth [name]   06/2017 -  Anti-estrogen oral therapy   Letrozole daily. Switch to Exemestane in 11/2017 due to joint pain.    09/03/2017 Survivorship   Survivorship Clinic with NP    01/19/2018 Mammogram   IMPRESSION: No mammographic evidence of breast malignancy.      CURRENT THERAPY:  Letrozole 2.36m daily starting 06/2017. Due to significant joint pain she switched to Exemestane starting 11/2017.  INTERVAL HISTORY:  [name] is here for a follow up of left breast cancer. She was able to identify herself by birth date. She notes she still has muscle spasms under left breast which get her out of breath, no matter what she does. She had 2 episodes of severe spasms which she nearly went to ED for. She notes she had EKG done with PCP and her heart function did not change. She notes her breast incision is on her lateral left breast. She notes these spasms 2-3 times a week.     REVIEW OF SYSTEMS:   Constitutional: Denies fevers, chills or abnormal weight loss Eyes: Denies blurriness of vision Ears, nose, mouth, throat, and face: Denies mucositis or sore throat Respiratory: Denies cough, dyspnea or wheezes Cardiovascular: Denies palpitation, chest discomfort or lower extremity swelling Gastrointestinal:  Denies nausea, heartburn or change in bowel habits Skin: Denies abnormal skin rashes MSK: (+) Left chest wall muscle spasm Lymphatics: Denies new lymphadenopathy or easy bruising Neurological:Denies numbness, tingling or new weaknesses Behavioral/Psych: Mood is stable, no new changes  All other systems were reviewed with the patient and are negative.  MEDICAL HISTORY:  Past Medical History:  Diagnosis Date   Arthritis    Breast cancer ([facility] 01/15/2017   left breast   Cancer ([facility])    GERD (gastroesophageal reflux disease)    HLD (hyperlipidemia)    Hypertension    Hypothyroidism    Obesity    Overactive bladder     Palpitation    Personal history of radiation therapy    Thyroid disease     SURGICAL HISTORY: Past Surgical History:  Procedure Laterality Date   bladder stem stretch  1966   BREAST LUMPECTOMY Left 02/16/2017   BREAST LUMPECTOMY WITH RADIOACTIVE SEED AND SENTINEL LYMPH NODE BIOPSY Left 02/16/2017   Procedure: BREAST LUMPECTOMY WITH RADIOACTIVE SEED AND AXILLARY SENTINEL LYMPH NODE BIOPSY;  Surgeon: MRolm Bookbinder MD;  [facility]  Service: General;  Laterality: Left;   EVACUATION BREAST HEMATOMA Left 03/18/2017   Procedure: EVACUATION SEROMA LEFT BREAST;  Surgeon: MRolm Bookbinder MD;  [facility]  Service: General;  Laterality: Left;   JOINT REPLACEMENT     KNEE ARTHROSCOPY     LEFT   NOSE SURGERY  1980   [place] Left 2007   [place]   Procedure: TOTAL HIP ARTHROPLASTY ANTERIOR APPROACH;  Surgeon: PMelrose Nakayama MD;  [facility]  Service: Orthopedics;  Laterality: Left;    I have reviewed the social history and family history with the patient and they are unchanged from previous note.  ALLERGIES:  has No Known Allergies.  MEDICATIONS:  Current Outpatient Medications  Medication Sig Dispense Refill   acetaminophen (TYLENOL) 325 MG tablet Take 650 mg by mouth every 6 (six) hours as needed for mild pain.  clobetasol cream (TEMOVATE) 6.75 % Apply 1 application topically 2 (two) times daily as needed (itching). 30 g 6   exemestane (AROMASIN) 25 MG tablet Take 1 tablet (25 mg total) by mouth daily after breakfast. 90 tablet 1   gabapentin (NEURONTIN) 100 MG capsule Take 1 capsule (100 mg total) by mouth 3 (three) times daily. 90 capsule 1   hydrochlorothiazide (HYDRODIURIL) 25 MG tablet Take 1 tablet (25 mg total) by mouth daily. 90 tablet 3   levothyroxine (SYNTHROID) 75 MCG tablet Take 1 tablet (75 mcg total) by mouth daily. 90 tablet 3    metoprolol succinate (TOPROL-XL) 25 MG 24 hr tablet Take 1 tablet (25 mg total) by mouth daily. 90 tablet 3   pantoprazole (PROTONIX) 40 MG tablet Take 1 tablet (40 mg total) by mouth daily. Pt. needs to set up follow up appt. 90 tablet 3   simvastatin (ZOCOR) 20 MG tablet Take 1 tablet (20 mg total) by mouth daily. 90 tablet 3   No current facility-administered medications for this visit.    PHYSICAL EXAMINATION: ECOG PERFORMANCE STATUS: 0 - Asymptomatic   No vitals taken today, Exam not performed today   LABORATORY DATA:  I have reviewed the data as listed CBC Latest Ref Rng & Units 01/27/2020 07/28/2019 09/23/2018  WBC 4.0 - 10.5 K/uL 6.3 7.3 6.7  Hemoglobin 12.0 - 15.0 g/dL 14.6 13.2 13.3  Hematocrit 36.0 - 46.0 % 43.7 39.9 39.5  Platelets 150 - 400 K/uL 302 276 315     CMP Latest Ref Rng & Units 01/27/2020 07/28/2019 09/23/2018  Glucose 70 - 99 mg/dL 104(H) 110(H) 88  BUN 8 - 23 mg/dL 13 11 10   Creatinine 0.44 - 1.00 mg/dL 1.07(H) 0.97 1.01(H)  Sodium 135 - 145 mmol/L 140 140 136  Potassium 3.5 - 5.1 mmol/L 4.0 3.5 3.4(L)  Chloride 98 - 111 mmol/L 104 103 97(L)  CO2 22 - 32 mmol/L 27 26 31   Calcium 8.9 - 10.3 mg/dL 9.5 9.4 9.6  Total Protein 6.5 - 8.1 g/dL 7.8 7.1 7.5  Total Bilirubin 0.3 - 1.2 mg/dL 0.7 0.7 1.0  Alkaline Phos 38 - 126 U/L 107 113 117  AST 15 - 41 U/L 37 23 19  ALT 0 - 44 U/L 46(H) 21 15      RADIOGRAPHIC STUDIES: I have personally reviewed the radiological images as listed and agreed with the findings in the report. No results found.   ASSESSMENT & PLAN:  [name] is a 64 y.o. female with    1. Malignant neoplasm of upper-outer quadrant of left breast, invasive lobular carcinoma, grade 1, pT2N0M0, ER+/PR+/HER2-, Oncotype RS low risk -She underwent left breast lumpectomy and sentinel lymph node biopsy in Feb 2018. -She started adjuvant letrozole in 06/2017. She experienced joint pain and headaches. She wasswitchedto exemestane, she is tolerating  exemestane very well, with mild joint stiffness, overall much more tolerable.She will continue for a total of 5-7 years. -From a breast cancer standpoint, she is stable. I reviewed her left chest wall spasms are likely from her breast surgery. I discussed management with her. She has no other concerns. 02/2020 Mammogram benign.  -Continue Surveillance. Next mammogram in 02/2021.  -Continue exemestane  -F/u in 4 months    2. Left breast/chest spasms  -Has been ongoing for the last 5-6 months and lasts 1 minute.  -Likely related to her breast surgeries.She did complete PT. I recommend she continue exercises and stretching left shoulder.  -This persists up to 2 minutes  3 times a week while active or relaxing. She will have severe flares occasionally.  -I will call in Gabapentin today (05/25/20) and she can start 147m nightly and if tolerable she can increase up to TID. I advised her to watch for drowsiness.   3. Hypertension, hypercholesterolemia, Hypothyroidism -She'll follow-up with her primary care physician -She takes HCTZ, levothyroxine.   4. Arthritis, minimal -f/u with PCP   5. Osteopenia  -07/29/17 bone density shows osteopenia in femur neck with a T-Score of -1.1.  -Ipreviouslydiscussedanti-estrogen therapy can lessen her bone density.  -Her 12/2019 DEXA shows slightly worsened osteopenia with lowest T-score -1.3 of femur neck..  -I previously recommended she start OTC calcium 600-12041mand vitamin D 1000-2000 units.     Plan: -I prescribed Gabapentin today for her left chest spasm -Continue Exemestane, she is tolerating well  -Lab and F/u in 4 months    No problem-specific Assessment & Plan notes found for this encounter.   No orders of the defined types were placed in this encounter.  I discussed the assessment and treatment plan with the patient. The patient was provided an opportunity to ask questions and all were answered. The patient agreed with the plan  and demonstrated an understanding of the instructions.  The patient was advised to call back or seek an in-person evaluation if the symptoms worsen or if the condition fails to improve as anticipated.  The total time spent in the appointment was 15 minutes.     YaTruitt MerleMD 05/25/2020   I, AmJoslyn Devonam acting as scribe for YaTruitt MerleMD.   I have reviewed the above documentation for accuracy and completeness, and I agree with the above.

## 2020-05-25 ENCOUNTER — Other Ambulatory Visit: Payer: Self-pay

## 2020-05-25 ENCOUNTER — Inpatient Hospital Stay: Payer: Managed Care, Other (non HMO) | Attending: Hematology | Admitting: Hematology

## 2020-05-25 DIAGNOSIS — I10 Essential (primary) hypertension: Secondary | ICD-10-CM

## 2020-05-25 DIAGNOSIS — Z170 Estrogen receptor positive status [ER+]: Secondary | ICD-10-CM

## 2020-05-25 DIAGNOSIS — C50412 Malignant neoplasm of upper-outer quadrant of left female breast: Secondary | ICD-10-CM | POA: Diagnosis not present

## 2020-05-25 MED ORDER — GABAPENTIN 100 MG PO CAPS
100.0000 mg | ORAL_CAPSULE | Freq: Three times a day (TID) | ORAL | 1 refills | Status: DC
Start: 2020-05-25 — End: 2021-06-03

## 2020-05-26 ENCOUNTER — Encounter: Payer: Self-pay | Admitting: Hematology

## 2020-06-04 ENCOUNTER — Telehealth: Payer: Self-pay | Admitting: Internal Medicine

## 2020-06-04 MED ORDER — METOPROLOL SUCCINATE ER 25 MG PO TB24: 25.0000 mg | ORAL_TABLET | Freq: Every day | ORAL | 3 refills | Status: DC

## 2020-06-04 MED ORDER — PANTOPRAZOLE SODIUM 40 MG PO TBEC: 40.0000 mg | DELAYED_RELEASE_TABLET | Freq: Every day | ORAL | 3 refills | Status: DC

## 2020-06-04 MED ORDER — LEVOTHYROXINE SODIUM 75 MCG PO TABS: 75.0000 ug | ORAL_TABLET | Freq: Every day | ORAL | 3 refills | Status: DC

## 2020-06-04 MED ORDER — SIMVASTATIN 20 MG PO TABS: 20.0000 mg | ORAL_TABLET | Freq: Every day | ORAL | 3 refills | Status: DC

## 2020-06-04 MED ORDER — HYDROCHLOROTHIAZIDE 25 MG PO TABS: 25.0000 mg | ORAL_TABLET | Freq: Every day | ORAL | 3 refills | Status: DC

## 2020-06-04 NOTE — Telephone Encounter (Signed)
    1.Medication Requested: hydrochlorothiazide (HYDRODIURIL) 25 MG tablet levothyroxine (SYNTHROID) 75 MCG tablet metoprolol succinate (TOPROL-XL) 25 MG 24 hr tablet pantoprazole (PROTONIX) 40 MG tablet simvastatin (ZOCOR) 20 MG tablet  2. Pharmacy (Name, Street, City):[id number] - [place], [place]  3. On Med List: yes  4. Last Visit with PCP: 01/27/20  5. Next visit date with PCP:   Agent: Please be advised that RX refills may take up to 3 business days. We ask that you follow-up with your pharmacy.

## 2020-09-19 ENCOUNTER — Telehealth: Payer: Self-pay | Admitting: Hematology

## 2020-09-19 NOTE — Telephone Encounter (Signed)
Rescheduled appts on 10/15 to earlier that day. Provider is unavailable that after noon. Pt is aware of appt time and date moved.

## 2020-09-26 ENCOUNTER — Other Ambulatory Visit: Payer: Managed Care, Other (non HMO)

## 2020-09-26 ENCOUNTER — Ambulatory Visit: Payer: Managed Care, Other (non HMO) | Admitting: Hematology

## 2020-10-03 NOTE — Progress Notes (Signed)
[facility]   [phone] [phone]   Clinic Follow up Note   Patient Care Team: Hoyt Koch, MD as PCP - General (Internal Medicine) Delice Bison, Charlestine Massed, NP as Nurse Practitioner (Hematology and Oncology) Truitt Merle, MD as Consulting Physician (Hematology) Kyung Rudd, MD as Consulting Physician (Radiation Oncology) Rolm Bookbinder, MD as Consulting Physician (General Surgery)  Date of Service:  10/05/2020  CHIEF COMPLAINT: Follow up left breast cancer  SUMMARY OF ONCOLOGIC HISTORY: Oncology History Overview Note  Cancer Staging Malignant neoplasm of upper-outer quadrant of left breast in female, estrogen receptor positive ([place]) Staging form: Breast, AJCC 8th Edition - Clinical stage from 01/15/2017: Stage IA (cT1c, cN0, cM0, G1, ER: Positive, PR: Positive, HER2: Negative) - Signed by Truitt Merle, MD on 01/27/2017    Malignant neoplasm of upper-outer quadrant of left breast in female, estrogen receptor positive ([place])  01/15/2017 Initial Biopsy   Left breast 2:00 core needle biopsy showed invasive lobular carcinoma, atypical lobular hyperplasia. Grade 1.   01/15/2017 Receptors her2   ER 90% positive, PR 90% positive, HER-2 negative, Ki-67 1%   01/15/2017 Mammogram   Bilateral diagnostic mammogram and ultrasound showed irregular hypoechoic mass in the left breast 2:00 position, 9 cm from the nipple, measuring 1.2 x 1.0 x 0.8 cm, no evidence of malignancy within the right breast, ultrasound of the left axilla was negative. Breast density category B   01/15/2017 Initial Diagnosis   Malignant neoplasm of upper-outer quadrant of left breast in female, estrogen receptor positive ([place])   02/16/2017 Pathology Results   Diagnosis 1. Breast, lumpectomy, Left - INVASIVE GRADE I LOBULAR CARCINOMA. - TUMOR IS ESTIMATED TO SPAN AT LEAST 2.1 CM IN GREATEST DIMENSION. 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 -  INVASIVE TUMOR IS FOCALLY LESS THAN 0.1 CM TO LATERAL MARGIN BUT INKED MARGINAL SURFACE IS NEGATIVE FOR TUMOR. - INITIAL ANTERIOR MARGIN CANNOT BE EFFECTIVELY CLEARED OF INVASIVE TUMOR, PLEASE SEE SPECIMEN #10 FOR FINAL MARGIN STATUS. - SEE ONCOLOGY TEMPLATE. 2. 4 left axillary sentinel lymph nodes were negative 3. Additional resection of left posterior, superior, anterior and inferior margins were benign.    02/16/2017 Surgery   breast lumpectomy with radioactive seed and axillary sentinel lymph node biposy with Dr. Donne Hazel   02/16/2017 Oncotype testing   Testing resulted in reccurance score of 12.   02/16/2017 Pathology Results   Left breast lumpectomy showed a 2.1 cm invasive grade 1 lobular carcinoma, associated LCIS and atypical lobular hyperplasia. Surgical margins were negative. 4 sentinel lymph nodes were negative.   02/16/2017 Oncotype testing   RS 12, low risk, predicts 10 year risk of distant recurrence 8% with tamoxifen, adjuvant chemotherapy was not recommended.   03/09/2017 Imaging    IMPRESSION: Large complex fluid collection extending throughout the upper left breast, 3:00 to 9:00 axes, at posterior depth, presumed hematoma, possibly multiloculated.   03/18/2017 Surgery    Evacuation seroma left bresat with Dr. Donne Hazel    04/21/2017 - 06/08/2017 Radiation Therapy   Patient received radiation treatment with Dr. Lisbeth Renshaw   06/2017 -  Anti-estrogen oral therapy   Letrozole daily. Switch to Exemestane in 11/2017 due to joint pain.    09/03/2017 Survivorship   Survivorship Clinic with NP    01/19/2018 Mammogram   IMPRESSION: No mammographic evidence of breast malignancy.      CURRENT THERAPY:  Letrozole 2.83m daily starting 06/2017. Due to significant joint pain she switched to Exemestane starting 11/2017.  INTERVAL HISTORY:  [name] is here for a follow up of left breast cancer. She was last seen by me 4 months ago. She presents to the clinic alone. She notes  she is doing well. She notes she still has muscle spasms in her left breast. She still takes Gabapentin 13m. She does not take it every night, but it mostly helps. She notes pain in her left hand with swelling occasionally. She notes 1 episode of arm pain at night and woke her out of sleep. She is able to function adequately. She is overall tolerating Exemestane well.  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 She notes she has insurance coverage again.    REVIEW OF SYSTEMS:   Constitutional: Denies fevers, chills or abnormal weight loss Eyes: Denies blurriness of vision Ears, nose, mouth, throat, and face: Denies mucositis or sore throat Respiratory: Denies cough, dyspnea or wheezes Cardiovascular: Denies palpitation, chest discomfort or lower extremity swelling Gastrointestinal:  Denies nausea, heartburn or change in bowel habits Skin: Denies abnormal skin rashes MSK: (+) Left hand pain occasionally (+) intermittent Left breast muscle spasms Lymphatics: Denies new lymphadenopathy or easy bruising Neurological:Denies numbness, tingling or new weaknesses  Behavioral/Psych: Mood is stable, no new changes  All other systems were reviewed with the patient and are negative.  MEDICAL HISTORY:  Past Medical History:  Diagnosis Date  . Arthritis   . [place]   left breast  . [place]   . GERD (gastroesophageal reflux disease)   . HLD (hyperlipidemia)   . Hypertension   . Hypothyroidism   . Obesity   . Overactive bladder   . Palpitation   . Personal history of radiation therapy   . Thyroid disease     SURGICAL HISTORY: Past Surgical History:  Procedure Laterality Date  . bladder stem stretch  1966  . BREAST LUMPECTOMY Left 02/16/2017  . BREAST LUMPECTOMY WITH RADIOACTIVE SEED AND SENTINEL LYMPH NODE BIOPSY Left 02/16/2017   Procedure: BREAST LUMPECTOMY WITH RADIOACTIVE SEED AND AXILLARY SENTINEL LYMPH NODE BIOPSY;   Surgeon: MRolm Bookbinder MD;  [facility]  Service: General;  Laterality: Left;  . EVACUATION BREAST HEMATOMA Left 03/18/2017   Procedure: EVACUATION SEROMA LEFT BREAST;  Surgeon: MRolm Bookbinder MD;  [facility]  Service: General;  Laterality: Left;  . JOINT REPLACEMENT    . KNEE ARTHROSCOPY     LEFT  . NOSE SURGERY  1980  . SHOULDER SURGERY Left 2007  . TONSILLECTOMY  1964  . TOTAL HIP ARTHROPLASTY Left 10/23/2015   Procedure: TOTAL HIP ARTHROPLASTY ANTERIOR APPROACH;  Surgeon: PMelrose Nakayama MD;  [facility]  Service: Orthopedics;  Laterality: Left;    I have reviewed the social history and family history with the patient and they are unchanged from previous note.  ALLERGIES:  has No Known Allergies.  MEDICATIONS:  Current Outpatient Medications  Medication Sig Dispense Refill  . acetaminophen (TYLENOL) 325 MG tablet Take 650 mg by mouth every 6 (six) hours as needed for mild pain.    . clobetasol cream (TEMOVATE) 03.81% Apply 1 application topically 2 (two) times daily as needed (itching). 30 g 6  . exemestane (AROMASIN) 25 MG tablet Take 1 tablet (25 mg total) by mouth daily after breakfast. 90 tablet 1  . gabapentin (NEURONTIN) 100 MG capsule Take 1 capsule (100 mg total) by mouth 3 (three) times daily. 90 capsule 1  . hydrochlorothiazide (HYDRODIURIL)  25 MG tablet Take 1 tablet (25 mg total) by mouth daily. 90 tablet 3  . levothyroxine (SYNTHROID) 75 MCG tablet Take 1 tablet (75 mcg total) by mouth daily. 90 tablet 3  . metoprolol succinate (TOPROL-XL) 25 MG 24 hr tablet Take 1 tablet (25 mg total) by mouth daily. 90 tablet 3  . pantoprazole (PROTONIX) 40 MG tablet Take 1 tablet (40 mg total) by mouth daily. Pt. needs to set up follow up appt. 90 tablet 3  . simvastatin (ZOCOR) 20 MG tablet Take 1 tablet (20 mg total) by mouth daily. 90 tablet 3   Current Facility-Administered Medications  Medication Dose Route  Frequency Provider Last Rate Last Admin  . influenza vac split quadrivalent PF (FLUARIX) injection 0.5 mL  0.5 mL Intramuscular Once Truitt Merle, MD        PHYSICAL EXAMINATION: ECOG PERFORMANCE STATUS: 0 - Asymptomatic  Vitals:   10/05/20 1102  BP: 126/77  Pulse: 67  Resp: 17  Temp: 97.7 F (36.5 C)  SpO2: 100%   Filed Weights   10/05/20 1102  Weight: 206 lb 14.4 oz (93.8 kg)    GENERAL:alert, no distress and comfortable SKIN: skin color, texture, turgor are normal, no rashes or significant lesions EYES: normal, Conjunctiva are pink and non-injected, sclera clear  NECK: supple, thyroid normal size, non-tender, without nodularity LYMPH:  no palpable lymphadenopathy in the cervical, axillary  LUNGS: clear to auscultation and percussion with normal breathing effort HEART: regular rate & rhythm and no murmurs and no lower extremity edema ABDOMEN:abdomen soft, non-tender and normal bowel sounds Musculoskeletal:no cyanosis of digits and no clubbing  NEURO: alert & oriented x 3 with fluent speech, no focal motor/sensory deficits BREAST: S/p left lumpectomy: Surgical incision healed well (+) Mild skin erythema under b/l breast in skin fold. Right breast exam benign.   LABORATORY DATA:  I have reviewed the data as listed CBC Latest Ref Rng & Units 10/05/2020 01/27/2020 07/28/2019  WBC 4.0 - 10.5 K/uL 7.3 6.3 7.3  Hemoglobin 12.0 - 15.0 g/dL 13.8 14.6 13.2  Hematocrit 36 - 46 % 41.1 43.7 39.9  Platelets 150 - 400 K/uL 313 302 276     CMP Latest Ref Rng & Units 10/05/2020 01/27/2020 07/28/2019  Glucose 70 - 99 mg/dL 114(H) 104(H) 110(H)  BUN 8 - 23 mg/dL 13 13 11   Creatinine 0.44 - 1.00 mg/dL 0.96 1.07(H) 0.97  Sodium 135 - 145 mmol/L 138 140 140  Potassium 3.5 - 5.1 mmol/L 3.8 4.0 3.5  Chloride 98 - 111 mmol/L 103 104 103  CO2 22 - 32 mmol/L 30 27 26   Calcium 8.9 - 10.3 mg/dL 10.0 9.5 9.4  Total Protein 6.5 - 8.1 g/dL 7.9 7.8 7.1  Total Bilirubin 0.3 - 1.2 mg/dL 0.8 0.7 0.7    Alkaline Phos 38 - 126 U/L 109 107 113  AST 15 - 41 U/L 21 37 23  ALT 0 - 44 U/L 19 46(H) 21      RADIOGRAPHIC STUDIES: I have personally reviewed the radiological images as listed and agreed with the findings in the report. No results found.   ASSESSMENT & PLAN:  [name] is a 64 y.o. female with    1. Malignant neoplasm of upper-outer quadrant of left breast, invasive lobular carcinoma, grade 1, pT2N0M0, ER+/PR+/HER2-, Oncotype RS low risk -She underwent left breast lumpectomy and sentinel lymph node biopsy in Feb 2018. -She started adjuvant letrozole in 06/2017. She experienced joint pain and headaches. She wasswitchedto exemestane, she is  tolerating exemestane very well, with mild joint stiffness especially in left hand, overall much more tolerable.She will continue for a total of 5-7 years.If not tolerable I discussed switching her to Tamoxifen.  -She is clinically doing well. Lab reviewed, her CBC and CMP are within normal limits. Her physical exam and her 02/2020 mammogram were unremarkable. There is no clinical concern for recurrence. -Continue Surveillance. Next mammogram in 02/2021.  -Continue exemestane  -F/u in 6 months    2. Leftbreast/chest spasms  -Has been ongoing since beginning of 2021 and lasts 1 minute.  -Likely related to her breast surgeries. She did complete PT. I recommend she continue exercises and stretching manageable and stable on Gabapentin 163m nightly as needed. She is fine to increase dose if tolerable. I advised her to watch for drowsiness.   3. Hypertension, hypercholesterolemia, Hypothyroidism, Arthritis  -She'll follow-up with her primary care physician -She takes HCTZ,levothyroxine.   4. Osteopenia  -07/29/17 bone density shows osteopenia in femur neck with a T-Score of -1.1. Her 12/2019 DEXA shows slightly worsened osteopenia with lowest T-score -1.3 of femur neck.. -Ipreviouslydiscussedanti-estrogen therapy can lessen her bone  density. -Continue OTCcalciumand vitamin D   Plan: -Flu shot today  -Continue Exemestane  -Lab and f/u in 6 months  -Mammogram in 02/2021   No problem-specific Assessment & Plan notes found for this encounter.   Orders Placed This Encounter  Procedures  . MM DIAG BREAST TOMO BILATERAL    Standing Status:   Future    Standing Expiration Date:   10/05/2021    Order Specific Question:   Reason for Exam (SYMPTOM  OR DIAGNOSIS REQUIRED)    Answer:   screening    Order Specific Question:   [facility]    [facility]  All questions were answered. The patient knows to call the clinic with any problems, questions or concerns. No barriers to learning was detected. The total time spent in the appointment was 30 minutes.     YTruitt Merle MD 10/05/2020   I, AJoslyn Devon am acting as scribe for YTruitt Merle MD.   I have reviewed the above documentation for accuracy and completeness, and I agree with the above.

## 2020-10-05 ENCOUNTER — Inpatient Hospital Stay: Payer: BC Managed Care – PPO | Attending: Hematology

## 2020-10-05 ENCOUNTER — Ambulatory Visit: Payer: Managed Care, Other (non HMO) | Admitting: Hematology

## 2020-10-05 ENCOUNTER — Encounter: Payer: Self-pay | Admitting: Hematology

## 2020-10-05 ENCOUNTER — Inpatient Hospital Stay (HOSPITAL_BASED_OUTPATIENT_CLINIC_OR_DEPARTMENT_OTHER): Payer: BC Managed Care – PPO | Admitting: Hematology

## 2020-10-05 ENCOUNTER — Other Ambulatory Visit: Payer: Self-pay

## 2020-10-05 ENCOUNTER — Other Ambulatory Visit: Payer: Managed Care, Other (non HMO)

## 2020-10-05 VITALS — BP 126/77 | HR 67 | Temp 97.7°F | Resp 17 | Ht 66.0 in | Wt 206.9 lb

## 2020-10-05 DIAGNOSIS — I10 Essential (primary) hypertension: Secondary | ICD-10-CM | POA: Insufficient documentation

## 2020-10-05 DIAGNOSIS — Z23 Encounter for immunization: Secondary | ICD-10-CM

## 2020-10-05 DIAGNOSIS — M85859 Other specified disorders of bone density and structure, unspecified thigh: Secondary | ICD-10-CM | POA: Diagnosis not present

## 2020-10-05 DIAGNOSIS — E7800 Pure hypercholesterolemia, unspecified: Secondary | ICD-10-CM | POA: Diagnosis not present

## 2020-10-05 DIAGNOSIS — C50412 Malignant neoplasm of upper-outer quadrant of left female breast: Secondary | ICD-10-CM | POA: Insufficient documentation

## 2020-10-05 DIAGNOSIS — E785 Hyperlipidemia, unspecified: Secondary | ICD-10-CM | POA: Diagnosis not present

## 2020-10-05 DIAGNOSIS — Z170 Estrogen receptor positive status [ER+]: Secondary | ICD-10-CM | POA: Insufficient documentation

## 2020-10-05 DIAGNOSIS — E039 Hypothyroidism, unspecified: Secondary | ICD-10-CM | POA: Diagnosis not present

## 2020-10-05 DIAGNOSIS — E669 Obesity, unspecified: Secondary | ICD-10-CM | POA: Diagnosis not present

## 2020-10-05 DIAGNOSIS — K219 Gastro-esophageal reflux disease without esophagitis: Secondary | ICD-10-CM | POA: Diagnosis not present

## 2020-10-05 DIAGNOSIS — Z79899 Other long term (current) drug therapy: Secondary | ICD-10-CM | POA: Diagnosis not present

## 2020-10-05 DIAGNOSIS — Z79811 Long term (current) use of aromatase inhibitors: Secondary | ICD-10-CM | POA: Insufficient documentation

## 2020-10-05 LAB — COMPREHENSIVE METABOLIC PANEL
ALT: 19 U/L (ref 0–44)
AST: 21 U/L (ref 15–41)
Albumin: 3.9 g/dL (ref 3.5–5.0)
Alkaline Phosphatase: 109 U/L (ref 38–126)
Anion gap: 5 (ref 5–15)
BUN: 13 mg/dL (ref 8–23)
CO2: 30 mmol/L (ref 22–32)
Calcium: 10 mg/dL (ref 8.9–10.3)
Chloride: 103 mmol/L (ref 98–111)
Creatinine, Ser: 0.96 mg/dL (ref 0.44–1.00)
GFR, Estimated: >60 mL/min (ref >60)
Glucose, Bld: 114 mg/dL — ABNORMAL HIGH (ref 70–99)
Potassium: 3.8 mmol/L (ref 3.5–5.1)
Sodium: 138 mmol/L (ref 135–145)
Total Bilirubin: 0.8 mg/dL (ref 0.3–1.2)
Total Protein: 7.9 g/dL (ref 6.5–8.1)

## 2020-10-05 LAB — CBC WITH DIFFERENTIAL/PLATELET
Abs Immature Granulocytes: 0.02 10*3/uL (ref 0.00–0.07)
Basophils Absolute: 0.1 10*3/uL (ref 0.0–0.1)
Basophils Relative: 1 %
Eosinophils Absolute: 0.2 10*3/uL (ref 0.0–0.5)
Eosinophils Relative: 3 %
HCT: 41.1 % (ref 36.0–46.0)
Hemoglobin: 13.8 g/dL (ref 12.0–15.0)
Immature Granulocytes: 0 %
Lymphocytes Relative: 25 %
Lymphs Abs: 1.8 10*3/uL (ref 0.7–4.0)
MCH: 29.4 pg (ref 26.0–34.0)
MCHC: 33.6 g/dL (ref 30.0–36.0)
MCV: 87.4 fL (ref 80.0–100.0)
Monocytes Absolute: 0.5 10*3/uL (ref 0.1–1.0)
Monocytes Relative: 7 %
Neutro Abs: 4.7 10*3/uL (ref 1.7–7.7)
Neutrophils Relative %: 64 %
Platelets: 313 10*3/uL (ref 150–400)
RBC: 4.7 MIL/uL (ref 3.87–5.11)
RDW: 13 % (ref 11.5–15.5)
WBC: 7.3 10*3/uL (ref 4.0–10.5)
nRBC: 0 % (ref 0.0–0.2)

## 2020-10-05 MED ORDER — INFLUENZA VAC SPLIT QUAD 0.5 ML IM SUSY
PREFILLED_SYRINGE | INTRAMUSCULAR | Status: AC
  Filled 2020-10-05: qty 0.5

## 2020-10-05 MED ORDER — INFLUENZA VAC SPLIT QUAD 0.5 ML IM SUSY
0.5000 mL | PREFILLED_SYRINGE | Freq: Once | INTRAMUSCULAR | Status: AC
  Administered 2020-10-05: 0.5 mL via INTRAMUSCULAR

## 2020-11-05 ENCOUNTER — Telehealth: Payer: Self-pay | Admitting: Internal Medicine

## 2020-11-05 NOTE — Telephone Encounter (Signed)
Patient called and said she tested positive for Covid 19 on 11/03/2020. The patient was wondering if she qualified for an infusion. Please call the patient at [phone]

## 2020-11-05 NOTE — Telephone Encounter (Signed)
Spoke with pt informed her of infusion Center would contact her regarding eligibility and scheduling.

## 2020-11-05 NOTE — Telephone Encounter (Signed)
We have forwarded her information to the infusion center and she will hear back from them on eligibility and scheduling if eligible.

## 2020-11-06 ENCOUNTER — Encounter: Payer: Self-pay | Admitting: Oncology

## 2020-11-06 ENCOUNTER — Ambulatory Visit (HOSPITAL_COMMUNITY)
Admission: RE | Admit: 2020-11-06 | Discharge: 2020-11-06 | Disposition: A | Discharge status: Home / Self Care | Payer: Managed Care, Other (non HMO) | Source: Ambulatory Visit | Attending: Pulmonary Disease | Admitting: Pulmonary Disease

## 2020-11-06 ENCOUNTER — Other Ambulatory Visit: Payer: Self-pay | Admitting: Oncology

## 2020-11-06 ENCOUNTER — Telehealth (HOSPITAL_COMMUNITY): Payer: Self-pay

## 2020-11-06 DIAGNOSIS — U071 COVID-19: Secondary | ICD-10-CM | POA: Insufficient documentation

## 2020-11-06 MED ORDER — DIPHENHYDRAMINE HCL 50 MG/ML IJ SOLN: 50.0000 mg | Freq: Once | INTRAMUSCULAR | Status: DC | PRN

## 2020-11-06 MED ORDER — EPINEPHRINE 0.3 MG/0.3ML IJ SOAJ: 0.3000 mg | Freq: Once | INTRAMUSCULAR | Status: DC | PRN

## 2020-11-06 MED ORDER — SOTROVIMAB 500 MG/8ML IV SOLN
500.0000 mg | Freq: Once | INTRAVENOUS | Status: AC
  Administered 2020-11-06: 500 mg via INTRAVENOUS

## 2020-11-06 MED ORDER — ALBUTEROL SULFATE HFA 108 (90 BASE) MCG/ACT IN AERS: 2.0000 | INHALATION_SPRAY | Freq: Once | RESPIRATORY_TRACT | Status: DC | PRN

## 2020-11-06 MED ORDER — FAMOTIDINE IN NACL 20-0.9 MG/50ML-% IV SOLN: 20.0000 mg | Freq: Once | INTRAVENOUS | Status: DC | PRN

## 2020-11-06 MED ORDER — SODIUM CHLORIDE 0.9 % IV SOLN: INTRAVENOUS | Status: DC | PRN

## 2020-11-06 MED ORDER — METHYLPREDNISOLONE SODIUM SUCC 125 MG IJ SOLR: 125.0000 mg | Freq: Once | INTRAMUSCULAR | Status: DC | PRN

## 2020-11-06 NOTE — Progress Notes (Signed)
I connected by phone with  Mrs. Dority to discuss the potential use of an new treatment for mild to moderate COVID-19 viral infection in non-hospitalized patients.   This patient is a age/sex that meets the FDA criteria for Emergency Use Authorization of casirivimab\imdevimab.  Has a (+) direct SARS-CoV-2 viral test result 1. Has mild or moderate COVID-19  2. Is ? 62 years of age and weighs ? 40 kg 3. Is NOT hospitalized due to COVID-19 4. Is NOT requiring oxygen therapy or requiring an increase in baseline oxygen flow rate due to COVID-19 5. Is within 10 days of symptom onset 6. Has at least one of the high risk factor(s) for progression to severe COVID-19 and/or hospitalization as defined in EUA. Specific high risk criteria : Past Medical History:  Diagnosis Date  . Arthritis   . Breast cancer ([place]) 01/15/2017   left breast  . Cancer ([place])   . GERD (gastroesophageal reflux disease)   . HLD (hyperlipidemia)   . Hypertension   . Hypothyroidism   . Obesity   . Overactive bladder   . Palpitation   . Personal history of radiation therapy   . Thyroid disease   ?  ?    Symptom onset  11/03/20   I have spoken and communicated the following to the patient or parent/caregiver:   1. FDA has authorized the emergency use of casirivimab\imdevimab for the treatment of mild to moderate COVID-19 in adults and pediatric patients with positive results of direct SARS-CoV-2 viral testing who are 42 years of age and older weighing at least 40 kg, and who are at high risk for progressing to severe COVID-19 and/or hospitalization.   2. The significant known and potential risks and benefits of casirivimab\imdevimab, and the extent to which such potential risks and benefits are unknown.   3. Information on available alternative treatments and the risks and benefits of those alternatives, including clinical trials.   4. Patients treated with casirivimab\imdevimab should continue to self-isolate and use  infection control measures (e.g., wear mask, isolate, social distance, avoid sharing personal items, clean and disinfect "high touch" surfaces, and frequent handwashing) according to CDC guidelines.    5. The patient or parent/caregiver has the option to accept or refuse casirivimab\imdevimab .   After reviewing this information with the patient, The patient agreed to proceed with receiving casirivimab\imdevimab infusion and will be provided a copy of the Fact sheet prior to receiving the infusion.Rulon Abide, AGNP-C [phone] ([place])

## 2020-11-06 NOTE — Progress Notes (Signed)
  Diagnosis: COVID-19  Physician:Dr Joya Gaskins  Procedure: sotrovimab  Complications: No immediate complications noted.  Discharge: Discharged home   [place], [place] 11/06/2020

## 2020-11-06 NOTE — Telephone Encounter (Signed)
Called to Discuss with patient about Covid symptoms and the use of the monoclonal antibody infusion for those with mild to moderate Covid symptoms and at a high risk of hospitalization.     Pt appears to qualify for this infusion due to co-morbid conditions and/or a member of an at-risk group in accordance with the FDA Emergency Use Authorization.    Sx onset 11/03/20. Rapid at home test + 11/03/20, + PCR at Dr. [name] 11/03/20.  Risk factors include hypertension and BMI >25.  Pt pre-screened by RN and ready for APP to call and further discuss and/or schedule for appt.  Verbalizes understanding of potential costs for treatment.

## 2020-11-06 NOTE — Discharge Instructions (Signed)

## 2020-11-24 ENCOUNTER — Other Ambulatory Visit: Payer: Self-pay | Admitting: Hematology

## 2021-01-30 ENCOUNTER — Other Ambulatory Visit: Payer: Self-pay

## 2021-02-01 ENCOUNTER — Ambulatory Visit: Payer: Managed Care, Other (non HMO) | Admitting: Internal Medicine

## 2021-02-25 ENCOUNTER — Other Ambulatory Visit: Payer: Self-pay

## 2021-02-25 ENCOUNTER — Ambulatory Visit
Admission: RE | Admit: 2021-02-25 | Discharge: 2021-02-25 | Disposition: A | Discharge status: Home / Self Care | Payer: BC Managed Care – PPO | Source: Ambulatory Visit | Attending: Hematology | Admitting: Hematology

## 2021-02-25 DIAGNOSIS — C50412 Malignant neoplasm of upper-outer quadrant of left female breast: Secondary | ICD-10-CM

## 2021-03-22 ENCOUNTER — Other Ambulatory Visit: Payer: Self-pay | Admitting: Internal Medicine

## 2021-03-25 ENCOUNTER — Telehealth: Payer: Self-pay | Admitting: Hematology

## 2021-03-25 NOTE — Telephone Encounter (Signed)
Spoke with patient to reschedule- patient is aware.

## 2021-04-03 ENCOUNTER — Encounter: Payer: Self-pay | Admitting: Podiatry

## 2021-04-03 ENCOUNTER — Ambulatory Visit: Payer: BC Managed Care – PPO | Admitting: Podiatry

## 2021-04-03 ENCOUNTER — Other Ambulatory Visit: Payer: Self-pay

## 2021-04-03 DIAGNOSIS — L600 Ingrowing nail: Secondary | ICD-10-CM

## 2021-04-03 MED ORDER — NEOMYCIN-POLYMYXIN-HC 3.5-10000-1 OT SOLN: OTIC | 1 refills | Status: DC

## 2021-04-03 NOTE — Progress Notes (Signed)
Subjective:   Patient ID: [name], female   DOB: 65 y.o.   MRN: [id number]   HPI Patient presents stating she has chronic damage and pain to her big toenails of both feet and they get sore and make it hard for her to wear shoe gear and walk comfortably.  States this is been an ongoing issue and patient does not smoke likes to be active   Review of Systems  All other systems reviewed and are negative.       Objective:  Physical Exam Vitals and nursing note reviewed.  Constitutional:      Appearance: She is well-developed.  Pulmonary:     Effort: Pulmonary effort is normal.  Musculoskeletal:        General: Normal range of motion.  Skin:    General: Skin is warm.  Neurological:     Mental Status: She is alert.     Neurovascular status intact muscle strength adequate range of motion adequate with incurvated thickened hallux nails both feet that are painful when pressed and make shoe gear difficult.  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  We discussed the pros and cons of nail removal and I did discuss corners versus the entire nail and she is opted for permanent nail removal entirely.  I allowed her to read consent form she signed understanding risk and procedures and today I infiltrated each hallux 60 mg like Marcaine mixture sterile prep done and using sterile instrumentation remove the hallux nail exposed matrix applied phenol 5 applications 30 seconds to each nail base followed by alcohol lavage sterile dressing and gave instructions on soaks encouraging her to leave dressings on 24 hours but take them off earlier if needed.  Also wrote prescription for drops to use postoperatively

## 2021-04-03 NOTE — Patient Instructions (Signed)

## 2021-04-05 ENCOUNTER — Inpatient Hospital Stay: Payer: BC Managed Care – PPO | Admitting: Hematology

## 2021-04-05 ENCOUNTER — Inpatient Hospital Stay: Payer: BC Managed Care – PPO

## 2021-04-10 NOTE — Progress Notes (Signed)
[facility]   [phone] Fax:([phone]   Clinic Follow up Note   Patient Care Team: Hoyt Koch, MD as PCP - General (Internal Medicine) Delice Bison, Charlestine Massed, NP as Nurse Practitioner (Hematology and Oncology) Truitt Merle, MD as Consulting Physician (Hematology) Kyung Rudd, MD as Consulting Physician (Radiation Oncology) Rolm Bookbinder, MD as Consulting Physician (General Surgery)  Date of Service:  04/12/2021  CHIEF COMPLAINT: Follow up left breast cancer  SUMMARY OF ONCOLOGIC HISTORY: Oncology History Overview Note  Cancer Staging Malignant neoplasm of upper-outer quadrant of left breast in female, estrogen receptor positive ([place]) Staging form: Breast, AJCC 8th Edition - Clinical stage from 01/15/2017: Stage IA (cT1c, cN0, cM0, G1, ER: Positive, PR: Positive, HER2: Negative) - Signed by Truitt Merle, MD on 01/27/2017    Malignant neoplasm of upper-outer quadrant of left breast in female, estrogen receptor positive ([place])  01/15/2017 Initial Biopsy   Left breast 2:00 core needle biopsy showed invasive lobular carcinoma, atypical lobular hyperplasia. Grade 1.   01/15/2017 Receptors her2   ER 90% positive, PR 90% positive, HER-2 negative, Ki-67 1%   01/15/2017 Mammogram   Bilateral diagnostic mammogram and ultrasound showed irregular hypoechoic mass in the left breast 2:00 position, 9 cm from the nipple, measuring 1.2 x 1.0 x 0.8 cm, no evidence of malignancy within the right breast, ultrasound of the left axilla was negative. Breast density category B   01/15/2017 Initial Diagnosis   Malignant neoplasm of upper-outer quadrant of left breast in female, estrogen receptor positive ([place])   02/16/2017 Pathology Results   Diagnosis 1. Breast, lumpectomy, Left - INVASIVE GRADE I LOBULAR CARCINOMA. - TUMOR IS ESTIMATED TO SPAN AT LEAST 2.1 CM IN GREATEST DIMENSION. 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 -  INVASIVE TUMOR IS FOCALLY LESS THAN 0.1 CM TO LATERAL MARGIN BUT INKED MARGINAL SURFACE IS NEGATIVE FOR TUMOR. - INITIAL ANTERIOR MARGIN CANNOT BE EFFECTIVELY CLEARED OF INVASIVE TUMOR, PLEASE SEE SPECIMEN #10 FOR FINAL MARGIN STATUS. - SEE ONCOLOGY TEMPLATE. 2. 4 left axillary sentinel lymph nodes were negative 3. Additional resection of left posterior, superior, anterior and inferior margins were benign.    02/16/2017 Surgery   breast lumpectomy with radioactive seed and axillary sentinel lymph node biposy with Dr. Donne Hazel   02/16/2017 Oncotype testing   Testing resulted in reccurance score of 12.   02/16/2017 Pathology Results   Left breast lumpectomy showed a 2.1 cm invasive grade 1 lobular carcinoma, associated LCIS and atypical lobular hyperplasia. Surgical margins were negative. 4 sentinel lymph nodes were negative.   02/16/2017 Oncotype testing   RS 12, low risk, predicts 10 year risk of distant recurrence 8% with tamoxifen, adjuvant chemotherapy was not recommended.   03/09/2017 Imaging    IMPRESSION: Large complex fluid collection extending throughout the upper left breast, 3:00 to 9:00 axes, at posterior depth, presumed hematoma, possibly multiloculated.   03/18/2017 Surgery    Evacuation seroma left bresat with Dr. Donne Hazel    04/21/2017 - 06/08/2017 Radiation Therapy   Patient received radiation treatment with Dr. Lisbeth Renshaw   06/2017 -  Anti-estrogen oral therapy   Letrozole daily. Switch to Exemestane in 11/2017 due to joint pain.    09/03/2017 Survivorship   Survivorship Clinic with NP    01/19/2018 Mammogram   IMPRESSION: No mammographic evidence of breast malignancy.      CURRENT THERAPY:  Letrozole 2.24m daily starting 06/2017. Due to significant joint pain she switched to Exemestane starting 11/2017.  INTERVAL HISTORY:  [name] is here for a follow up of left breast cancer. She was last seen by me 6 months ago. She presents to the clinic alone. She notes  she is doing well. She notes she is on Exemestane and tolerating well with no hot flashes or major joint pain. She only has trigger finger. I reviewed her medication list with her. She is on Gabapentin for left breast nerve pain from surgery. She is only taking 116m at night. She notes she has gained 15 pounds in the past 6 months. She notes she eats a lot of carbs.    REVIEW OF SYSTEMS:   Constitutional: Denies fevers, chills or abnormal weight loss Eyes: Denies blurriness of vision Ears, nose, mouth, throat, and face: Denies mucositis or sore throat Respiratory: Denies cough, dyspnea or wheezes Cardiovascular: Denies palpitation, chest discomfort or lower extremity swelling Gastrointestinal:  Denies nausea, heartburn or change in bowel habits Skin: Denies abnormal skin rashes  MSK: (+) Trigger finger  Lymphatics: Denies new lymphadenopathy or easy bruising Neurological:Denies numbness, tingling or new weaknesses Behavioral/Psych: Mood is stable, no new changes  All other systems were reviewed with the patient and are negative.  MEDICAL HISTORY:  Past Medical History:  Diagnosis Date  . Arthritis   . Breast cancer [place] 01/15/2017   left breast  . Cancer ([facility]   . GERD (gastroesophageal reflux disease)   . HLD (hyperlipidemia)   . Hypertension   . Hypothyroidism   . Obesity   . Overactive bladder   . Palpitation   . Personal history of radiation therapy   . Thyroid disease     SURGICAL HISTORY: Past Surgical History:  Procedure Laterality Date  . bladder stem stretch  1966  . BREAST LUMPECTOMY Left 02/16/2017  . BREAST LUMPECTOMY WITH RADIOACTIVE SEED AND SENTINEL LYMPH NODE BIOPSY Left 02/16/2017   Procedure: BREAST LUMPECTOMY WITH RADIOACTIVE SEED AND AXILLARY SENTINEL LYMPH NODE BIOPSY;  Surgeon: MRolm Bookbinder MD;  [place] [facility]  Service: General;  Laterality: Left;  . EVACUATION BREAST HEMATOMA Left 03/18/2017   Procedure: EVACUATION SEROMA  LEFT BREAST;  Surgeon: MRolm Bookbinder MD;  [place] [facility]  Service: General;  Laterality: Left;  . JOINT REPLACEMENT    . KNEE ARTHROSCOPY     LEFT  . NOSE SURGERY  1980  . SHOULDER SURGERY Left 2007  . TONSILLECTOMY  1964  . TOTAL HIP ARTHROPLASTY Left 10/23/2015   Procedure: TOTAL HIP ARTHROPLASTY ANTERIOR APPROACH;  Surgeon: PMelrose Nakayama MD;  [place] [facility]  Service: Orthopedics;  Laterality: Left;    I have reviewed the social history and family history with the patient and they are unchanged from previous note.  ALLERGIES:  has No Known Allergies.  MEDICATIONS:  Current Outpatient Medications  Medication Sig Dispense Refill  . acetaminophen (TYLENOL) 325 MG tablet Take 650 mg by mouth every 6 (six) hours as needed for mild pain.    . clobetasol cream (TEMOVATE) 09.39% APPLY 1 APPLICATION TOPICALLY 2 (TWO) TIMES DAILY AS NEEDED (ITCHING). 30 g 6  . exemestane (AROMASIN) 25 MG tablet TAKE 1 TABLET (25 MG TOTAL) BY MOUTH DAILY AFTER BREAKFAST. 90 tablet 3  . gabapentin (NEURONTIN) 100 MG capsule Take 1 capsule (100 mg total) by mouth 3 (three) times daily. 90 capsule 1  . hydrochlorothiazide (HYDRODIURIL) 25 MG tablet Take 1 tablet (25 mg total) by mouth daily. 90 tablet 3  . levothyroxine (SYNTHROID) 75 MCG tablet Take 1 tablet (75 mcg total)  by mouth daily. 90 tablet 3  . metoprolol succinate (TOPROL-XL) 25 MG 24 hr tablet Take 1 tablet (25 mg total) by mouth daily. 90 tablet 3  . neomycin-polymyxin-hydrocortisone (CORTISPORIN) OTIC solution Apply 1-2 drops to toe after soaking BID 10 mL 1  . pantoprazole (PROTONIX) 40 MG tablet Take 1 tablet (40 mg total) by mouth daily. Pt. needs to set up follow up appt. 90 tablet 3  . simvastatin (ZOCOR) 20 MG tablet Take 1 tablet (20 mg total) by mouth daily. 90 tablet 3   No current facility-administered medications for this visit.    PHYSICAL EXAMINATION: ECOG PERFORMANCE STATUS: 0 - Asymptomatic  Vitals:    04/12/21 1125  BP: 132/77  Pulse: 70  Resp: 18  Temp: (!) 97 F (36.1 C)  SpO2: 100%   Filed Weights   04/12/21 1125  Weight: 220 lb 3.2 oz (99.9 kg)    GENERAL:alert, no distress and comfortable SKIN: skin color, texture, turgor are normal, no rashes or significant lesions EYES: normal, Conjunctiva are pink and non-injected, sclera clear  NECK: supple, thyroid normal size, non-tender, without nodularity LYMPH:  no palpable lymphadenopathy in the cervical, axillary  LUNGS: clear to auscultation and percussion with normal breathing effort HEART: regular rate & rhythm and no murmurs and no lower extremity edema ABDOMEN:abdomen soft, non-tender and normal bowel sounds Musculoskeletal:no cyanosis of digits and no clubbing  NEURO: alert & oriented x 3 with fluent speech, no focal motor/sensory deficits BREAST: S/p left lumpectomy: surgical incision healed well with minimal scar tissue. No palpable mass, nodules or adenopathy bilaterally. Breast exam benign.   LABORATORY DATA:  I have reviewed the data as listed CBC Latest Ref Rng & Units 04/12/2021 10/05/2020 01/27/2020  WBC 4.0 - 10.5 K/uL 8.6 7.3 6.3  Hemoglobin 12.0 - 15.0 g/dL 14.0 13.8 14.6  Hematocrit 36.0 - 46.0 % 42.2 41.1 43.7  Platelets 150 - 400 K/uL 335 313 302     CMP Latest Ref Rng & Units 04/12/2021 10/05/2020 01/27/2020  Glucose 70 - 99 mg/dL 97 114(H) 104(H)  BUN 8 - 23 mg/dL 10 13 13   Creatinine 0.44 - 1.00 mg/dL 0.98 0.96 1.07(H)  Sodium 135 - 145 mmol/L 141 138 140  Potassium 3.5 - 5.1 mmol/L 4.0 3.8 4.0  Chloride 98 - 111 mmol/L 102 103 104  CO2 22 - 32 mmol/L 29 30 27   Calcium 8.9 - 10.3 mg/dL 9.9 10.0 9.5  Total Protein 6.5 - 8.1 g/dL 7.9 7.9 7.8  Total Bilirubin 0.3 - 1.2 mg/dL 0.7 0.8 0.7  Alkaline Phos 38 - 126 U/L 101 109 107  AST 15 - 41 U/L 21 21 37  ALT 0 - 44 U/L 16 19 46(H)      RADIOGRAPHIC STUDIES: I have personally reviewed the radiological images as listed and agreed with the findings in  the report. No results found.   ASSESSMENT & PLAN:  COURTLYN AKI is a 65 y.o. female with    1. Malignant neoplasm of upper-outer quadrant of left breast, invasive lobular carcinoma, grade 1, pT2N0M0, ER+/PR+/HER2-, Oncotype RS low risk -She was diagnosed in 12/2016. She underwent left breast lumpectomy and sentinel lymph node biopsy in Feb 2018. -She started adjuvant letrozole in 06/2017. She experienced joint pain and headaches. She wasswitchedto exemestane, she is tolerating exemestane very well. She will continue for a total of 7-10 years due to lobular histology. -She is clinically doing well. Lab reviewed, her CBC and CMP are within normal limits. Her physical exam  and her 02/2021 mammogram were unremarkable. There is no clinical concern for recurrence. -Continue Surveillance. Next mammogram in 02/2022.  -Continue exemestane  -F/u in 6 months   2. Leftbreast/chest spasms  -Has been ongoing since beginning of 2021, likely related to surgery.  -Controlled on Gabapentin 11m nightly as needed  3. Hypertension, hypercholesterolemia, Hypothyroidism, Arthritis  -She'll follow-up with her primary care physician -She takes HCTZ,levothyroxine.  -She has had weight gain (about 15 pounds) in the past 6 months. I discussed this could be from Exemestane. I recommend she reduce carbohydrates in diet and increase protein and exercise.   4. Osteopenia  -07/29/17 bone density shows osteopenia in femur neck with a T-Score of -1.1. Her 12/2019 DEXA shows slightly worsened osteopenia with lowest T-score -1.3 of femur neck..Will repeat in 12/2021.  -Ipreviouslydiscussedanti-estrogen therapy can lessen her bone density. -Continue OTCcalciumand vitamin D. I also encouraged her to do weight bearing exercise.   Plan: -Continue Exemestane  -Lab and f/u in 6 months with NP Lacie    No problem-specific Assessment & Plan notes found for this encounter.   No orders of the defined types were  placed in this encounter.  All questions were answered. The patient knows to call the clinic with any problems, questions or concerns. No barriers to learning was detected. The total time spent in the appointment was 25 minutes.     YTruitt Merle MD 04/12/2021   I, AJoslyn Devon am acting as scribe for YTruitt Merle MD.   I have reviewed the above documentation for accuracy and completeness, and I agree with the above.

## 2021-04-12 ENCOUNTER — Inpatient Hospital Stay: Payer: BC Managed Care – PPO | Admitting: Hematology

## 2021-04-12 ENCOUNTER — Encounter: Payer: Self-pay | Admitting: Hematology

## 2021-04-12 ENCOUNTER — Inpatient Hospital Stay: Payer: BC Managed Care – PPO | Attending: Hematology

## 2021-04-12 ENCOUNTER — Other Ambulatory Visit: Payer: Self-pay

## 2021-04-12 VITALS — BP 132/77 | HR 70 | Temp 97.0°F | Resp 18 | Ht 66.0 in | Wt 220.2 lb

## 2021-04-12 DIAGNOSIS — C50412 Malignant neoplasm of upper-outer quadrant of left female breast: Secondary | ICD-10-CM

## 2021-04-12 DIAGNOSIS — Z170 Estrogen receptor positive status [ER+]: Secondary | ICD-10-CM | POA: Diagnosis not present

## 2021-04-12 DIAGNOSIS — Z79811 Long term (current) use of aromatase inhibitors: Secondary | ICD-10-CM | POA: Diagnosis not present

## 2021-04-12 DIAGNOSIS — Z923 Personal history of irradiation: Secondary | ICD-10-CM | POA: Insufficient documentation

## 2021-04-12 LAB — COMPREHENSIVE METABOLIC PANEL
ALT: 16 U/L (ref 0–44)
AST: 21 U/L (ref 15–41)
Albumin: 4 g/dL (ref 3.5–5.0)
Alkaline Phosphatase: 101 U/L (ref 38–126)
Anion gap: 10 (ref 5–15)
BUN: 10 mg/dL (ref 8–23)
CO2: 29 mmol/L (ref 22–32)
Calcium: 9.9 mg/dL (ref 8.9–10.3)
Chloride: 102 mmol/L (ref 98–111)
Creatinine, Ser: 0.98 mg/dL (ref 0.44–1.00)
GFR, Estimated: >60 mL/min (ref >60)
Glucose, Bld: 97 mg/dL (ref 70–99)
Potassium: 4 mmol/L (ref 3.5–5.1)
Sodium: 141 mmol/L (ref 135–145)
Total Bilirubin: 0.7 mg/dL (ref 0.3–1.2)
Total Protein: 7.9 g/dL (ref 6.5–8.1)

## 2021-04-12 LAB — CBC WITH DIFFERENTIAL/PLATELET
Abs Immature Granulocytes: 0.03 10*3/uL (ref 0.00–0.07)
Basophils Absolute: 0.1 10*3/uL (ref 0.0–0.1)
Basophils Relative: 1 %
Eosinophils Absolute: 0.2 10*3/uL (ref 0.0–0.5)
Eosinophils Relative: 3 %
HCT: 42.2 % (ref 36.0–46.0)
Hemoglobin: 14 g/dL (ref 12.0–15.0)
Immature Granulocytes: 0 %
Lymphocytes Relative: 24 %
Lymphs Abs: 2 10*3/uL (ref 0.7–4.0)
MCH: 29.2 pg (ref 26.0–34.0)
MCHC: 33.2 g/dL (ref 30.0–36.0)
MCV: 88.1 fL (ref 80.0–100.0)
Monocytes Absolute: 0.7 10*3/uL (ref 0.1–1.0)
Monocytes Relative: 8 %
Neutro Abs: 5.5 10*3/uL (ref 1.7–7.7)
Neutrophils Relative %: 64 %
Platelets: 335 10*3/uL (ref 150–400)
RBC: 4.79 MIL/uL (ref 3.87–5.11)
RDW: 13.1 % (ref 11.5–15.5)
WBC: 8.6 10*3/uL (ref 4.0–10.5)
nRBC: 0 % (ref 0.0–0.2)

## 2021-06-01 ENCOUNTER — Other Ambulatory Visit: Payer: Self-pay | Admitting: Hematology

## 2021-08-11 ENCOUNTER — Other Ambulatory Visit: Payer: Self-pay | Admitting: Internal Medicine

## 2021-08-16 ENCOUNTER — Ambulatory Visit: Payer: BC Managed Care – PPO | Admitting: Internal Medicine

## 2021-08-16 ENCOUNTER — Encounter: Payer: Self-pay | Admitting: Internal Medicine

## 2021-08-16 ENCOUNTER — Other Ambulatory Visit: Payer: Self-pay

## 2021-08-16 VITALS — BP 122/80 | HR 58 | Temp 97.6°F | Resp 18 | Ht 66.0 in | Wt 221.4 lb

## 2021-08-16 DIAGNOSIS — Z0000 Encounter for general adult medical examination without abnormal findings: Secondary | ICD-10-CM | POA: Diagnosis not present

## 2021-08-16 DIAGNOSIS — E782 Mixed hyperlipidemia: Secondary | ICD-10-CM

## 2021-08-16 DIAGNOSIS — I10 Essential (primary) hypertension: Secondary | ICD-10-CM

## 2021-08-16 DIAGNOSIS — E039 Hypothyroidism, unspecified: Secondary | ICD-10-CM | POA: Diagnosis not present

## 2021-08-16 LAB — LIPID PANEL
Cholesterol: 148 mg/dL (ref 0–200)
HDL: 45.6 mg/dL (ref >39.00)
LDL Cholesterol: 87 mg/dL (ref 0–99)
NonHDL: 102.46
Total CHOL/HDL Ratio: 3
Triglycerides: 76 mg/dL (ref 0.0–149.0)
VLDL: 15.2 mg/dL (ref 0.0–40.0)

## 2021-08-16 LAB — TSH: TSH: 2.78 u[IU]/mL (ref 0.35–5.50)

## 2021-08-16 MED ORDER — LEVOTHYROXINE SODIUM 75 MCG PO TABS
75.0000 ug | ORAL_TABLET | Freq: Every day | ORAL | 3 refills | Status: DC
Start: 2021-08-16 — End: 2022-08-19

## 2021-08-16 NOTE — Progress Notes (Signed)
   Subjective:   Patient ID: [name], female    DOB: [birth year], 65 y.o.   MRN: [id number]  HPI The patient is a 65 YO female coming in for physical.   PMH, [place], social history reviewed and updated  Review of Systems  Constitutional: Negative.   HENT: Negative.    Eyes: Negative.   Respiratory:  Negative for cough, chest tightness and shortness of breath.   Cardiovascular:  Negative for chest pain, palpitations and leg swelling.  Gastrointestinal:  Negative for abdominal distention, abdominal pain, constipation, diarrhea, nausea and vomiting.  Musculoskeletal: Negative.   Skin: Negative.   Neurological: Negative.   Psychiatric/Behavioral: Negative.     Objective:  Physical Exam Constitutional:      Appearance: She is well-developed.  HENT:     Head: Normocephalic and atraumatic.  Cardiovascular:     Rate and Rhythm: Normal rate and regular rhythm.  Pulmonary:     Effort: Pulmonary effort is normal. No respiratory distress.     Breath sounds: Normal breath sounds. No wheezing or rales.  Abdominal:     General: Bowel sounds are normal. There is no distension.     Palpations: Abdomen is soft.     Tenderness: There is no abdominal tenderness. There is no rebound.  Musculoskeletal:     Cervical back: Normal range of motion.  Skin:    General: Skin is warm and dry.  Neurological:     Mental Status: She is alert and oriented to person, place, and time.     Coordination: Coordination normal.    Vitals:   08/16/21 0831  BP: 122/80  Pulse: (!) 58  Resp: 18  Temp: 97.6 F (36.4 C)  SpO2: 98%  Weight: 221 lb 6.4 oz (100.4 kg)  Height: '5\' 6"'$  (1.676 m)    This visit occurred during the SARS-CoV-2 public health emergency.  Safety protocols were in place, including screening questions prior to the visit, additional usage of staff PPE, and extensive cleaning of exam room while observing appropriate contact time as indicated for disinfecting solutions.   Assessment & Plan:

## 2021-08-16 NOTE — Assessment & Plan Note (Signed)
BP at goal on metoprolol and hctz. Recent CMP with oncology without indication for change so will continue.

## 2021-08-16 NOTE — Patient Instructions (Signed)
Think about getting the shingles vaccine.   

## 2021-08-16 NOTE — Assessment & Plan Note (Signed)
Checking lipid panel and adjust simvastatin 20 mg daily as needed.  

## 2021-08-16 NOTE — Assessment & Plan Note (Signed)
Flu shot yearly. Covid-19 up to date. Shingrix counseled declines today. Tetanus up to date. Colonoscopy up to date. Mammogram up to date, pap smear up to date. Counseled about sun safety and mole surveillance. Counseled about the dangers of distracted driving. Given 10 year screening recommendations.

## 2021-08-16 NOTE — Assessment & Plan Note (Signed)
Checking TSH and refilled synthroid 75 mcg daily today. No symptoms of over or under replacement.

## 2021-09-02 ENCOUNTER — Other Ambulatory Visit: Payer: Self-pay | Admitting: Internal Medicine

## 2021-09-02 ENCOUNTER — Other Ambulatory Visit: Payer: Self-pay | Admitting: Hematology

## 2021-10-03 ENCOUNTER — Telehealth (INDEPENDENT_AMBULATORY_CARE_PROVIDER_SITE_OTHER): Payer: BC Managed Care – PPO | Admitting: Registered Nurse

## 2021-10-03 ENCOUNTER — Encounter: Payer: Self-pay | Admitting: Registered Nurse

## 2021-10-03 ENCOUNTER — Other Ambulatory Visit: Payer: Self-pay

## 2021-10-03 ENCOUNTER — Telehealth: Payer: Self-pay | Admitting: Internal Medicine

## 2021-10-03 VITALS — Temp 99.4°F

## 2021-10-03 DIAGNOSIS — J22 Unspecified acute lower respiratory infection: Secondary | ICD-10-CM | POA: Diagnosis not present

## 2021-10-03 MED ORDER — AZITHROMYCIN 250 MG PO TABS: ORAL_TABLET | ORAL | 0 refills | Status: AC

## 2021-10-03 MED ORDER — HYDROCODONE BIT-HOMATROP MBR 5-1.5 MG/5ML PO SOLN: 5.0000 mL | Freq: Three times a day (TID) | ORAL | 0 refills | Status: DC | PRN

## 2021-10-03 MED ORDER — BENZONATATE 200 MG PO CAPS: 200.0000 mg | ORAL_CAPSULE | Freq: Two times a day (BID) | ORAL | 0 refills | Status: DC | PRN

## 2021-10-03 MED ORDER — PREDNISONE 10 MG (21) PO TBPK: ORAL_TABLET | ORAL | 0 refills | Status: DC

## 2021-10-03 NOTE — Telephone Encounter (Signed)
Called patient. LVM with Dr. Nathanial Millman recommendation. Office number was provided.

## 2021-10-03 NOTE — Telephone Encounter (Signed)
Fyi.

## 2021-10-03 NOTE — Telephone Encounter (Signed)
Caller states that she has nasal congestion, cough, and a fever of 99.4. She has rib and back pain, she thinks from coughing so much. She also hurts in the front of her chest when she coughs. It started last Wednesday evening with a tickle in her throat. Friday she did not feel the best. She got Mucinex DM because she was coughing more. It messed her stomach up. She had nausea & diarrhea. She switched to Robitussin DS. The diarrhea got better but her other symptoms have slowly gotten worse. She coughed all night last night.   Advised to be seen within 4 hrs. Patient understood. Caller was transferred to back-line; scheduled for virtual visit on 10/04/2021 at 1:10p.

## 2021-10-03 NOTE — Patient Instructions (Signed)
° ° ° °  If you have lab work done today you will be contacted with your lab results within the next 2 weeks.  If you have not heard from us then please contact us. The fastest way to get your results is to register for My Chart. ° ° °IF you received an x-ray today, you will receive an invoice from [facility]. Please contact [facility] at [phone] with questions or concerns regarding your invoice.  ° °IF you received labwork today, you will receive an invoice from [facility]. Please contact [facility] at [phone] with questions or concerns regarding your invoice.  ° °Our billing staff will not be able to assist you with questions regarding bills from these companies. ° °You will be contacted with the lab results as soon as they are available. The fastest way to get your results is to activate your My Chart account. Instructions are located on the last page of this paperwork. If you have not heard from us regarding the results in 2 weeks, please contact this office. °  ° ° ° °

## 2021-10-03 NOTE — Telephone Encounter (Signed)
Please advise to take zyrtec to help with nose congestion in the meantime.

## 2021-10-03 NOTE — Progress Notes (Signed)
Telemedicine Encounter- SOAP NOTE Established Patient  This telephone encounter was conducted with the patient's (or proxy's) verbal consent via audio telecommunications: yes/no: Yes Patient was instructed to have this encounter in a suitably private space; and to only have persons present to whom they give permission to participate. In addition, patient identity was confirmed by use of name plus two identifiers (DOB and address).  I discussed the limitations, risks, security and privacy concerns of performing an evaluation and management service by telephone and the availability of in person appointments. I also discussed with the patient that there may be a patient responsible charge related to this service. The patient expressed understanding and agreed to proceed.  I spent a total of 15 minutes talking with the patient or their proxy.  Patient at home Provider in office  Participants: Mallory Ruddy, NP and [name]  Chief Complaint  Patient presents with   Nasal Congestion    Patient states she has been experiencing some congestion , cough and low grade fever since last week .  Patient states she has been taking multiple otc medication.    Subjective   [name] is a 65 y.o. established patient. Telephone visit today for nasal congestion  HPI Onset over a week ago Nasal congestion, settled to chest. Now coughing, trouble sleeping. Some shob, doe. Sinus is still pressure, but chest is worst. Low grade temp - 99s.  OTC meds - nothing has helped. Some ibuprofen for rib pain from coughing.  Had been taking Mucinex DM, could not tolerate due to GI ae.  Used some robitussin CS, no relief.  No sick contacts Covid vaccine x 2 - no booster No home COVID test.  Patient Active Problem List   Diagnosis Date Noted   Malignant neoplasm of upper-outer quadrant of left breast in female, estrogen receptor positive ([place]) 01/21/2017   Primary osteoarthritis of left hip 10/23/2015    Essential hypertension 05/11/2015   Hypothyroidism 05/11/2015   Routine general medical examination at a health care facility 05/11/2015   Obesity 05/11/2015   Palpitations 01/03/2014   Hyperlipidemia 01/03/2014    Past Medical History:  Diagnosis Date   Arthritis    Breast cancer ([place]) 01/15/2017   left breast   Cancer ([facility])    GERD (gastroesophageal reflux disease)    HLD (hyperlipidemia)    Hypertension    Hypothyroidism    Obesity    Overactive bladder    Palpitation    Personal history of radiation therapy    Thyroid disease     Current Outpatient Medications  Medication Sig Dispense Refill   acetaminophen (TYLENOL) 325 MG tablet Take 650 mg by mouth every 6 (six) hours as needed for mild pain.     clobetasol cream (TEMOVATE) 0.24 % APPLY 1 APPLICATION TOPICALLY 2 (TWO) TIMES DAILY AS NEEDED (ITCHING). 30 g 6   exemestane (AROMASIN) 25 MG tablet TAKE 1 TABLET (25 MG TOTAL) BY MOUTH DAILY AFTER BREAKFAST. 90 tablet 3   gabapentin (NEURONTIN) 100 MG capsule TAKE 1 CAPSULE BY MOUTH THREE TIMES A DAY 90 capsule 1   hydrochlorothiazide (HYDRODIURIL) 25 MG tablet TAKE 1 TABLET BY MOUTH EVERY DAY 90 tablet 3   levothyroxine (SYNTHROID) 75 MCG tablet Take 1 tablet (75 mcg total) by mouth daily. 90 tablet 3   metoprolol succinate (TOPROL-XL) 25 MG 24 hr tablet TAKE 1 TABLET BY MOUTH EVERY DAY 90 tablet 3   neomycin-polymyxin-hydrocortisone (CORTISPORIN) OTIC solution Apply 1-2 drops to toe after  soaking BID 10 mL 1   pantoprazole (PROTONIX) 40 MG tablet TAKE 1 TABLET (40 MG TOTAL) BY MOUTH DAILY. PT. NEEDS TO SET UP FOLLOW UP APPT. 90 tablet 3   simvastatin (ZOCOR) 20 MG tablet TAKE 1 TABLET BY MOUTH EVERY DAY 90 tablet 3   No current facility-administered medications for this visit.    Not on File  Social History   Socioeconomic History   Marital status: Single    Spouse name: Not on file   Number of children: Not on file   Years of education: Not on file   Highest  education level: Not on file  Occupational History   Not on file  Tobacco Use   Smoking status: Never   Smokeless tobacco: Never  Substance and Sexual Activity   Alcohol use: No   Drug use: No   Sexual activity: Not on file  Other Topics Concern   Not on file  Social History Narrative   Not on file   Social Determinants of Health   Financial Resource Strain: Not on file  Food Insecurity: Not on file  Transportation Needs: Not on file  Physical Activity: Not on file  Stress: Not on file  Social Connections: Not on file  Intimate Partner Violence: Not on file    Review of Systems  Constitutional:  Positive for fever.  HENT: Negative.    Eyes: Negative.   Respiratory:  Positive for cough and sputum production.   Cardiovascular: Negative.   Gastrointestinal: Negative.   Genitourinary: Negative.   Musculoskeletal: Negative.   Skin: Negative.   Neurological: Negative.   Endo/Heme/Allergies: Negative.   Psychiatric/Behavioral: Negative.     Objective   Vitals as reported by the patient: Today's Vitals   10/03/21 1314  Temp: 99.4 F (37.4 C)  TempSrc: Temporal    There are no diagnoses linked to this encounter.  PLAN Suspect bacterial etiology. Will tx with zpack and prednisone as above Tessalon and hycodan for cough relief. Reviewed risks, benefits, AE, and alternatives to medications. Pt voices understanding Reviewed urgent care and ER precautions with pt who voiced understanding Patient encouraged to call clinic with any questions, comments, or concerns.  I discussed the assessment and treatment plan with the patient. The patient was provided an opportunity to ask questions and all were answered. The patient agreed with the plan and demonstrated an understanding of the instructions.   The patient was advised to call back or seek an in-person evaluation if the symptoms worsen or if the condition fails to improve as anticipated.  I provided 15 minutes of  non-face-to-face time during this encounter.  Maximiano Coss, NP

## 2021-10-11 ENCOUNTER — Inpatient Hospital Stay: Payer: BC Managed Care – PPO | Attending: Hematology

## 2021-10-11 ENCOUNTER — Other Ambulatory Visit: Payer: Self-pay

## 2021-10-11 ENCOUNTER — Inpatient Hospital Stay: Payer: BC Managed Care – PPO | Admitting: Hematology

## 2021-10-11 VITALS — BP 141/78 | HR 69 | Temp 98.4°F | Resp 17 | Wt 222.8 lb

## 2021-10-11 DIAGNOSIS — E2839 Other primary ovarian failure: Secondary | ICD-10-CM | POA: Diagnosis not present

## 2021-10-11 DIAGNOSIS — Z923 Personal history of irradiation: Secondary | ICD-10-CM | POA: Diagnosis not present

## 2021-10-11 DIAGNOSIS — Z1231 Encounter for screening mammogram for malignant neoplasm of breast: Secondary | ICD-10-CM

## 2021-10-11 DIAGNOSIS — C50412 Malignant neoplasm of upper-outer quadrant of left female breast: Secondary | ICD-10-CM | POA: Diagnosis present

## 2021-10-11 DIAGNOSIS — Z170 Estrogen receptor positive status [ER+]: Secondary | ICD-10-CM | POA: Diagnosis not present

## 2021-10-11 DIAGNOSIS — Z79811 Long term (current) use of aromatase inhibitors: Secondary | ICD-10-CM | POA: Insufficient documentation

## 2021-10-11 LAB — CBC WITH DIFFERENTIAL/PLATELET
Abs Immature Granulocytes: 0.14 10*3/uL — ABNORMAL HIGH (ref 0.00–0.07)
Basophils Absolute: 0.1 10*3/uL (ref 0.0–0.1)
Basophils Relative: 1 %
Eosinophils Absolute: 0.5 10*3/uL (ref 0.0–0.5)
Eosinophils Relative: 5 %
HCT: 40.2 % (ref 36.0–46.0)
Hemoglobin: 13.6 g/dL (ref 12.0–15.0)
Immature Granulocytes: 1 %
Lymphocytes Relative: 30 %
Lymphs Abs: 3 10*3/uL (ref 0.7–4.0)
MCH: 29.5 pg (ref 26.0–34.0)
MCHC: 33.8 g/dL (ref 30.0–36.0)
MCV: 87.2 fL (ref 80.0–100.0)
Monocytes Absolute: 0.7 10*3/uL (ref 0.1–1.0)
Monocytes Relative: 7 %
Neutro Abs: 5.7 10*3/uL (ref 1.7–7.7)
Neutrophils Relative %: 56 %
Platelets: 372 10*3/uL (ref 150–400)
RBC: 4.61 MIL/uL (ref 3.87–5.11)
RDW: 13.8 % (ref 11.5–15.5)
WBC: 10.1 10*3/uL (ref 4.0–10.5)
nRBC: 0 % (ref 0.0–0.2)

## 2021-10-11 LAB — CMP (CANCER CENTER ONLY)
ALT: 21 U/L (ref 0–44)
AST: 23 U/L (ref 15–41)
Albumin: 4.1 g/dL (ref 3.5–5.0)
Alkaline Phosphatase: 95 U/L (ref 38–126)
Anion gap: 7 (ref 5–15)
BUN: 17 mg/dL (ref 8–23)
CO2: 28 mmol/L (ref 22–32)
Calcium: 9.3 mg/dL (ref 8.9–10.3)
Chloride: 103 mmol/L (ref 98–111)
Creatinine: 1.02 mg/dL — ABNORMAL HIGH (ref 0.44–1.00)
GFR, Estimated: >60 mL/min (ref >60)
Glucose, Bld: 96 mg/dL (ref 70–99)
Potassium: 3.6 mmol/L (ref 3.5–5.1)
Sodium: 138 mmol/L (ref 135–145)
Total Bilirubin: 0.7 mg/dL (ref 0.3–1.2)
Total Protein: 7.8 g/dL (ref 6.5–8.1)

## 2021-10-11 MED ORDER — EXEMESTANE 25 MG PO TABS
25.0000 mg | ORAL_TABLET | Freq: Every day | ORAL | 3 refills | Status: DC
Start: 2021-10-11 — End: 2022-11-05

## 2021-10-11 NOTE — Progress Notes (Signed)
[place]   [phone] Fax:([phone]   Clinic Follow up Note   Patient Care Team: Hoyt Koch, MD as PCP - General (Internal Medicine) Delice Bison, Charlestine Massed, NP as Nurse Practitioner (Hematology and Oncology) Truitt Merle, MD as Consulting Physician (Hematology) Kyung Rudd, MD as Consulting Physician (Radiation Oncology) Rolm Bookbinder, MD as Consulting Physician (General Surgery)  Date of Service:  10/11/2021  CHIEF COMPLAINT: f/u of left breast cancer  CURRENT THERAPY:  Letrozole 2.19m daily starting 06/2017. Due to significant joint pain she switched to Exemestane starting 11/2017.   ASSESSMENT & PLAN:  Mallory BERRYHILLis a 65y.o. female with   1. Malignant neoplasm of upper-outer quadrant of left breast, invasive lobular carcinoma, grade 1, pT2N0M0, ER+/PR+/HER2-, Oncotype RS low risk -She was diagnosed in 12/2016. She underwent left breast lumpectomy and sentinel lymph node biopsy in Feb 2018. -She started adjuvant letrozole in 06/2017. She experienced joint pain and headaches. She was switched to exemestane, she is tolerating exemestane very well. She will continue for a total of 10 years due to lobular histology. -most recent MM 02/25/21 was negative. -She is clinically doing well. Lab reviewed, her CBC and CMP are within normal limits. Her physical exam was unremarkable. There is no clinical concern for recurrence. -Continue Surveillance. Next mammogram in 02/2022; she will likely get screening mammogram. -Continue exemestane  -F/u in 6 months     2. Left breast/chest spasms  -Has been ongoing since beginning of 2021, likely related to surgery.  -Controlled on Gabapentin 1048mnightly as needed   3. Hypertension, hypercholesterolemia, Hypothyroidism, Arthritis  -She'll follow-up with her primary care physician -She takes HCTZ, levothyroxine.  -She has had weight gain (about 15 pounds) from 09/2020-03/2021. I discussed this could  be from Exemestane. I previously recommended she reduce carbohydrates in diet and increase protein and exercise. Her weight has been stable since 03/2021.   4. Osteopenia  -07/29/17 bone density shows osteopenia in femur neck with a T-Score of -1.1. Her 12/2019 DEXA shows slightly worsened osteopenia with lowest T-score -1.3 of femur neck. Will repeat in 02/2022 with her mammogram.  -I previously discussed anti-estrogen therapy can lessen her bone density.  -Continue OTC calcium and vitamin D. I also encouraged her to do weight bearing exercise.     Plan: -Continue Exemestane, I refilled today -mammogram and DEXA in 02/2022  -Lab and f/u in 6 months with NP Lacie    No problem-specific Assessment & Plan notes found for this encounter.   SUMMARY OF ONCOLOGIC HISTORY: Oncology History Overview Note  Cancer Staging Malignant neoplasm of upper-outer quadrant of left breast in female, estrogen receptor positive (HCFergusStaging form: Breast, AJCC 8th Edition - Clinical stage from 01/15/2017: Stage IA (cT1c, cN0, cM0, G1, ER: Positive, PR: Positive, HER2: Negative) - Signed by YaTruitt MerleMD on 01/27/2017    Malignant neoplasm of upper-outer quadrant of left breast in female, estrogen receptor positive ([facility] 01/15/2017 Initial Biopsy   Left breast 2:00 core needle biopsy showed invasive lobular carcinoma, atypical lobular hyperplasia. Grade 1.   01/15/2017 Receptors her2   ER 90% positive, PR 90% positive, HER-2 negative, Ki-67 1%   01/15/2017 Mammogram   Bilateral diagnostic mammogram and ultrasound showed irregular hypoechoic mass in the left breast 2:00 position, 9 cm from the nipple, measuring 1.2 x 1.0 x 0.8 cm, no evidence of malignancy within the right breast, ultrasound of the left axilla was negative. Breast density category B   01/15/2017 Initial  Diagnosis   Malignant neoplasm of upper-outer quadrant of left breast in female, estrogen receptor positive ([place])   02/16/2017 Pathology Results    Diagnosis 1. Breast, lumpectomy, Left - INVASIVE GRADE I LOBULAR CARCINOMA. - TUMOR IS ESTIMATED TO SPAN AT LEAST 2.1 CM IN GREATEST DIMENSION. 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 - INVASIVE TUMOR IS FOCALLY LESS THAN 0.1 CM TO LATERAL MARGIN BUT INKED MARGINAL SURFACE IS NEGATIVE FOR TUMOR. - INITIAL ANTERIOR MARGIN CANNOT BE EFFECTIVELY CLEARED OF INVASIVE TUMOR, PLEASE SEE SPECIMEN #10 FOR FINAL MARGIN STATUS. - SEE ONCOLOGY TEMPLATE. 2. 4 left axillary sentinel lymph nodes were negative 3. Additional resection of left posterior, superior, anterior and inferior margins were benign.    02/16/2017 Surgery   breast lumpectomy with radioactive seed and axillary sentinel lymph node biposy with Dr. Donne Hazel   02/16/2017 Oncotype testing   Testing resulted in reccurance score of 12.   02/16/2017 Pathology Results   Left breast lumpectomy showed a 2.1 cm invasive grade 1 lobular carcinoma, associated LCIS and atypical lobular hyperplasia. Surgical margins were negative. 4 sentinel lymph nodes were negative.   02/16/2017 Oncotype testing   RS 12, low risk, predicts 10 year risk of distant recurrence 8% with tamoxifen, adjuvant chemotherapy was not recommended.   03/09/2017 Imaging    IMPRESSION: Large complex fluid collection extending throughout the upper left breast, 3:00 to 9:00 axes, at posterior depth, presumed hematoma, possibly multiloculated.   03/18/2017 Surgery    Evacuation seroma left bresat with Dr. Donne Hazel    04/21/2017 - 06/08/2017 Radiation Therapy   Patient received radiation treatment with Dr. Lisbeth Renshaw   06/2017 -  Anti-estrogen oral therapy   Letrozole daily. Switch to Exemestane in 11/2017 due to joint pain.    09/03/2017 Survivorship   Survivorship Clinic with NP    01/19/2018 Mammogram   IMPRESSION: No mammographic evidence of breast malignancy.      INTERVAL HISTORY:  [name] is here for a follow up of breast cancer.  She was last seen by me on 04/12/21. She presents to the clinic alone. She notes she picked up her daughter but wasn't sure if she could come back to the exam room. She reports she still has some joint stiffness, but nothing like when she was on letrozole. She no longer has trigger finger. She notes some mild hot flashes.  She endorses stretching the left arm, but she still notes some tightness. She continues working; it's a Designer, multimedia.   All other systems were reviewed with the patient and are negative.  MEDICAL HISTORY:  Past Medical History:  Diagnosis Date   Arthritis    Breast cancer ([place]) 01/15/2017   left breast   Cancer ([facility])    GERD (gastroesophageal reflux disease)    HLD (hyperlipidemia)    Hypertension    Hypothyroidism    Obesity    Overactive bladder    Palpitation    Personal history of radiation therapy    Thyroid disease     SURGICAL HISTORY: Past Surgical History:  Procedure Laterality Date   bladder stem stretch  1966   BREAST LUMPECTOMY Left 02/16/2017   BREAST LUMPECTOMY WITH RADIOACTIVE SEED AND SENTINEL LYMPH NODE BIOPSY Left 02/16/2017   Procedure: BREAST LUMPECTOMY WITH RADIOACTIVE SEED AND AXILLARY SENTINEL LYMPH NODE BIOPSY;  Surgeon: Rolm Bookbinder, MD;  Location: [place];  Service: General;  Laterality: Left;   EVACUATION BREAST HEMATOMA Left 03/18/2017   Procedure: EVACUATION SEROMA LEFT BREAST;  Surgeon: Rolm Bookbinder, MD;  Location: [place];  Service: General;  Laterality: Left;   JOINT REPLACEMENT     KNEE ARTHROSCOPY     LEFT   NOSE SURGERY  1980   SHOULDER SURGERY Left 2007   TONSILLECTOMY  1964   TOTAL HIP ARTHROPLASTY Left 10/23/2015   Procedure: TOTAL HIP ARTHROPLASTY ANTERIOR APPROACH;  Surgeon: Melrose Nakayama, MD;  Location: [place];  Service: Orthopedics;  Laterality: Left;    I have reviewed the social history and family history with the patient and they are unchanged from previous  note.  ALLERGIES:  has no allergies on file.  MEDICATIONS:  Current Outpatient Medications  Medication Sig Dispense Refill   acetaminophen (TYLENOL) 325 MG tablet Take 650 mg by mouth every 6 (six) hours as needed for mild pain.     benzonatate (TESSALON) 200 MG capsule Take 1 capsule (200 mg total) by mouth 2 (two) times daily as needed for cough. 20 capsule 0   clobetasol cream (TEMOVATE) 8.46 % APPLY 1 APPLICATION TOPICALLY 2 (TWO) TIMES DAILY AS NEEDED (ITCHING). 30 g 6   exemestane (AROMASIN) 25 MG tablet TAKE 1 TABLET (25 MG TOTAL) BY MOUTH DAILY AFTER BREAKFAST. 90 tablet 3   gabapentin (NEURONTIN) 100 MG capsule TAKE 1 CAPSULE BY MOUTH THREE TIMES A DAY 90 capsule 1   hydrochlorothiazide (HYDRODIURIL) 25 MG tablet TAKE 1 TABLET BY MOUTH EVERY DAY 90 tablet 3   HYDROcodone bit-homatropine (HYCODAN) 5-1.5 MG/5ML syrup Take 5 mLs by mouth every 8 (eight) hours as needed for cough. 120 mL 0   levothyroxine (SYNTHROID) 75 MCG tablet Take 1 tablet (75 mcg total) by mouth daily. 90 tablet 3   metoprolol succinate (TOPROL-XL) 25 MG 24 hr tablet TAKE 1 TABLET BY MOUTH EVERY DAY 90 tablet 3   neomycin-polymyxin-hydrocortisone (CORTISPORIN) OTIC solution Apply 1-2 drops to toe after soaking BID 10 mL 1   pantoprazole (PROTONIX) 40 MG tablet TAKE 1 TABLET (40 MG TOTAL) BY MOUTH DAILY. PT. NEEDS TO SET UP FOLLOW UP APPT. 90 tablet 3   predniSONE (STERAPRED UNI-PAK 21 TAB) 10 MG (21) TBPK tablet Take per package instructions. Do not skip doses. Finish entire supply. 1 each 0   simvastatin (ZOCOR) 20 MG tablet TAKE 1 TABLET BY MOUTH EVERY DAY 90 tablet 3   No current facility-administered medications for this visit.    PHYSICAL EXAMINATION: ECOG PERFORMANCE STATUS: 0 - Asymptomatic  There were no vitals filed for this visit. Wt Readings from Last 3 Encounters:  08/16/21 221 lb 6.4 oz (100.4 kg)  04/12/21 220 lb 3.2 oz (99.9 kg)  10/05/20 206 lb 14.4 oz (93.8 kg)     GENERAL:alert, no  distress and comfortable SKIN: skin color, texture, turgor are normal, no rashes or significant lesions EYES: normal, Conjunctiva are pink and non-injected, sclera clear  NECK: supple, thyroid normal size, non-tender, without nodularity LYMPH:  no palpable lymphadenopathy in the cervical, axillary  LUNGS: clear to auscultation and percussion with normal breathing effort HEART: regular rate & rhythm and no murmurs and no lower extremity edema ABDOMEN:abdomen soft, non-tender and normal bowel sounds Musculoskeletal:no cyanosis of digits and no clubbing  NEURO: alert & oriented x 3 with fluent speech, no focal motor/sensory deficits BREAST: No palpable mass, nodules or adenopathy bilaterally. Breast exam benign.   LABORATORY DATA:  I have reviewed the data as listed CBC Latest Ref Rng & Units 10/11/2021 04/12/2021 10/05/2020  WBC 4.0 - 10.5 K/uL 10.1 8.6 7.3  Hemoglobin  12.0 - 15.0 g/dL 13.6 14.0 13.8  Hematocrit 36.0 - 46.0 % 40.2 42.2 41.1  Platelets 150 - 400 K/uL 372 335 313     CMP Latest Ref Rng & Units 04/12/2021 10/05/2020 01/27/2020  Glucose 70 - 99 mg/dL 97 114(H) 104(H)  BUN 8 - 23 mg/dL _0 Creatinine 0.44 - 1.00 mg/dL 0.98 0.96 1.07(H)  Sodium 135 - 145 mmol/L 141 138 140  Potassium 3.5 - 5.1 mmol/L 4.0 3.8 4.0  Chloride 98 - 111 mmol/L 102 103 104  CO2 22 - 32 mmol/L _1 Calcium 8.9 - 10.3 mg/dL 9.9 10.0 9.5  Total Protein 6.5 - 8.1 g/dL 7.9 7.9 7.8  Total Bilirubin 0.3 - 1.2 mg/dL 0.7 0.8 0.7  Alkaline Phos 38 - 126 U/L 101 109 107  AST 15 - 41 U/L 21 21 37  ALT 0 - 44 U/L 16 19 46(H)      RADIOGRAPHIC STUDIES: I have personally reviewed the radiological images as listed and agreed with the findings in the report. No results found.    Orders Placed This Encounter  Procedures   MM Digital Screening    Standing Status:   Future    Standing Expiration Date:   10/11/2022    Order Specific Question:   Reason for Exam (SYMPTOM  OR DIAGNOSIS REQUIRED)     Answer:   screening    Order Specific Question:   Preferred imaging location?    Answer:   [facility]   DG Bone Density    Standing Status:   Future    Standing Expiration Date:   10/11/2022    Order Specific Question:   Reason for Exam (SYMPTOM  OR DIAGNOSIS REQUIRED)    Answer:   screening    Order Specific Question:   Preferred imaging location?    Answer:   [facility]   All questions were answered. The patient knows to call the clinic with any problems, questions or concerns. No barriers to learning was detected. The total time spent in the appointment was 30 minutes.     Truitt Merle, MD 10/11/2021   I, Wilburn Mylar, am acting as scribe for Truitt Merle, MD.   I have reviewed the above documentation for accuracy and completeness, and I agree with the above.

## 2021-10-13 ENCOUNTER — Encounter: Payer: Self-pay | Admitting: Hematology

## 2021-10-28 ENCOUNTER — Other Ambulatory Visit: Payer: Self-pay | Admitting: Hematology

## 2021-10-28 DIAGNOSIS — E2839 Other primary ovarian failure: Secondary | ICD-10-CM

## 2021-12-16 ENCOUNTER — Other Ambulatory Visit: Payer: Self-pay | Admitting: Hematology

## 2021-12-17 ENCOUNTER — Other Ambulatory Visit: Payer: Self-pay | Admitting: Hematology and Oncology

## 2021-12-17 MED ORDER — GABAPENTIN 100 MG PO CAPS: 100.0000 mg | ORAL_CAPSULE | Freq: Three times a day (TID) | ORAL | 1 refills | Status: AC

## 2022-02-04 ENCOUNTER — Telehealth: Payer: Self-pay | Admitting: Internal Medicine

## 2022-02-04 NOTE — Telephone Encounter (Signed)
Patient calling in  Covid+ at home test 02.14.23 (sore throat..headache..low grade fever..nasal drip.Marland Kitchencough)  Would like anti-viral sent to pharmacy  [id number] - [place], [place]  Phone:  [phone] Fax:  [phone]  Patient CB # if needed [phone]

## 2022-02-05 ENCOUNTER — Telehealth (INDEPENDENT_AMBULATORY_CARE_PROVIDER_SITE_OTHER): Payer: BC Managed Care – PPO | Admitting: Family Medicine

## 2022-02-05 ENCOUNTER — Encounter: Payer: Self-pay | Admitting: Family Medicine

## 2022-02-05 DIAGNOSIS — U071 COVID-19: Secondary | ICD-10-CM

## 2022-02-05 MED ORDER — BENZONATATE 200 MG PO CAPS: 200.0000 mg | ORAL_CAPSULE | Freq: Two times a day (BID) | ORAL | 0 refills | Status: DC | PRN

## 2022-02-05 MED ORDER — HYDROCOD POLI-CHLORPHE POLI ER 10-8 MG/5ML PO SUER: 5.0000 mL | Freq: Two times a day (BID) | ORAL | 0 refills | Status: AC | PRN

## 2022-02-05 NOTE — Progress Notes (Signed)
Virtual Video Visit via MyChart Note  I connected with  [name] on 02/05/22 at 11:20 AM EST by the video enabled telemedicine application for MyChart, and verified that I am speaking with the correct person using two identifiers.   I introduced myself as a Designer, jewellery with the practice. We discussed the limitations of evaluation and management by telemedicine and the availability of in person appointments. The patient expressed understanding and agreed to proceed.  Participating parties in this visit include: The patient and the nurse practitioner listed.  The patient is: At home I am: In the office - [facility]  Subjective:    CC: COVID +   HPI: [name] is a 66 y.o. year old female presenting today via [place] today for COVID infection.   Patient reports she started with mild symptoms on Saturday (4 days ago) that gradually progressed. She had a negative COVID test on Sunday, but then tested positive on Tuesday. She went to a walk-in clinic yesterday and was given Paxlovid prescription. Symptoms include headache, sinus pressure, nasal congestion, ear pressure, body aches, fatigue, cough, general malaise. She denies any chest pain, dyspnea, nausea, vomiting, diarrhea, urinary changes.      Past medical history, Surgical history, Family history not pertinant except as noted below, Social history, Allergies, and medications have been entered into the medical record, reviewed, and corrections made.   Review of Systems:  All review of systems negative except what is listed in the HPI   Objective:    General:  Speaking clearly in complete sentences. Absent shortness of breath noted.   Alert and oriented x3.   Normal judgment.  Absent acute distress.   Impression and Recommendations:    1. COVID-19 - benzonatate (TESSALON) 200 MG capsule; Take 1 capsule (200 mg total) by mouth 2 (two) times daily as needed for cough.  Dispense: 20  capsule; Refill: 0 - chlorpheniramine-HYDROcodone 10-8 MG/5ML; Take 5 mLs by mouth every 12 (twelve) hours as needed for up to 5 days for cough (cough, will cause drowsiness.).  Dispense: 50 mL; Refill: 0  Continue Paxlovid as previously prescribed. Adding Tussionex and refilling her Tessalon. Suggested she start Flonase as well as Mucinex. Continue supportive measures including rest, hydration, humidifier use, steam showers, warm compresses to sinuses, warm liquids with lemon and honey, and over-the-counter cough, cold, and analgesics as needed.  Patient aware of signs/symptoms requiring further/urgent evaluation.    Follow-up if symptoms worsen or fail to improve.    I discussed the assessment and treatment plan with the patient. The patient was provided an opportunity to ask questions and all were answered. The patient agreed with the plan and demonstrated an understanding of the instructions.   The patient was advised to call back or seek an in-person evaluation if the symptoms worsen or if the condition fails to improve as anticipated.  I spent 20 minutes dedicated to the care of this patient on the date of this encounter to include pre-visit chart review of prior notes and results, face-to-face time with the patient, and post-visit ordering of testing as indicated.   Terrilyn Saver, NP

## 2022-02-05 NOTE — Telephone Encounter (Signed)
Called PT and got her set up with NP Caleen Jobs for a virtual appointment!

## 2022-02-05 NOTE — Patient Instructions (Signed)

## 2022-04-01 ENCOUNTER — Ambulatory Visit
Admission: RE | Admit: 2022-04-01 | Discharge: 2022-04-01 | Disposition: A | Discharge status: Home / Self Care | Payer: BC Managed Care – PPO | Source: Ambulatory Visit | Attending: Hematology | Admitting: Hematology

## 2022-04-01 ENCOUNTER — Other Ambulatory Visit: Payer: BC Managed Care – PPO

## 2022-04-01 DIAGNOSIS — Z1231 Encounter for screening mammogram for malignant neoplasm of breast: Secondary | ICD-10-CM

## 2022-04-02 ENCOUNTER — Telehealth: Payer: Self-pay | Admitting: Hematology

## 2022-04-02 NOTE — Telephone Encounter (Signed)
Rescheduled upcoming appointment due to provider's PAL. Patient is aware of changes. ?

## 2022-04-11 ENCOUNTER — Inpatient Hospital Stay: Payer: BC Managed Care – PPO | Admitting: Hematology

## 2022-04-11 ENCOUNTER — Inpatient Hospital Stay: Payer: BC Managed Care – PPO

## 2022-05-05 ENCOUNTER — Other Ambulatory Visit: Payer: Self-pay

## 2022-05-05 ENCOUNTER — Encounter: Payer: Self-pay | Admitting: Hematology

## 2022-05-05 ENCOUNTER — Inpatient Hospital Stay: Payer: BC Managed Care – PPO | Admitting: Hematology

## 2022-05-05 ENCOUNTER — Inpatient Hospital Stay: Payer: BC Managed Care – PPO | Attending: Hematology

## 2022-05-05 VITALS — BP 132/71 | HR 68 | Temp 98.6°F | Resp 19 | Ht 66.0 in | Wt 228.7 lb

## 2022-05-05 DIAGNOSIS — E039 Hypothyroidism, unspecified: Secondary | ICD-10-CM | POA: Insufficient documentation

## 2022-05-05 DIAGNOSIS — Z79811 Long term (current) use of aromatase inhibitors: Secondary | ICD-10-CM | POA: Insufficient documentation

## 2022-05-05 DIAGNOSIS — Z79899 Other long term (current) drug therapy: Secondary | ICD-10-CM | POA: Diagnosis not present

## 2022-05-05 DIAGNOSIS — C50412 Malignant neoplasm of upper-outer quadrant of left female breast: Secondary | ICD-10-CM

## 2022-05-05 DIAGNOSIS — I10 Essential (primary) hypertension: Secondary | ICD-10-CM | POA: Diagnosis not present

## 2022-05-05 DIAGNOSIS — Z170 Estrogen receptor positive status [ER+]: Secondary | ICD-10-CM | POA: Diagnosis not present

## 2022-05-05 DIAGNOSIS — Z923 Personal history of irradiation: Secondary | ICD-10-CM | POA: Diagnosis not present

## 2022-05-05 DIAGNOSIS — M1990 Unspecified osteoarthritis, unspecified site: Secondary | ICD-10-CM | POA: Insufficient documentation

## 2022-05-05 DIAGNOSIS — E7800 Pure hypercholesterolemia, unspecified: Secondary | ICD-10-CM | POA: Diagnosis not present

## 2022-05-05 DIAGNOSIS — M85859 Other specified disorders of bone density and structure, unspecified thigh: Secondary | ICD-10-CM | POA: Diagnosis not present

## 2022-05-05 LAB — CBC WITH DIFFERENTIAL/PLATELET
Abs Immature Granulocytes: 0.02 10*3/uL (ref 0.00–0.07)
Basophils Absolute: 0.1 10*3/uL (ref 0.0–0.1)
Basophils Relative: 1 %
Eosinophils Absolute: 0.2 10*3/uL (ref 0.0–0.5)
Eosinophils Relative: 3 %
HCT: 39.3 % (ref 36.0–46.0)
Hemoglobin: 13.5 g/dL (ref 12.0–15.0)
Immature Granulocytes: 0 %
Lymphocytes Relative: 28 %
Lymphs Abs: 2 10*3/uL (ref 0.7–4.0)
MCH: 30.2 pg (ref 26.0–34.0)
MCHC: 34.4 g/dL (ref 30.0–36.0)
MCV: 87.9 fL (ref 80.0–100.0)
Monocytes Absolute: 0.4 10*3/uL (ref 0.1–1.0)
Monocytes Relative: 5 %
Neutro Abs: 4.5 10*3/uL (ref 1.7–7.7)
Neutrophils Relative %: 63 %
Platelets: 327 10*3/uL (ref 150–400)
RBC: 4.47 MIL/uL (ref 3.87–5.11)
RDW: 13.7 % (ref 11.5–15.5)
WBC: 7.1 10*3/uL (ref 4.0–10.5)
nRBC: 0 % (ref 0.0–0.2)

## 2022-05-05 LAB — COMPREHENSIVE METABOLIC PANEL
ALT: 18 U/L (ref 0–44)
AST: 19 U/L (ref 15–41)
Albumin: 4.1 g/dL (ref 3.5–5.0)
Alkaline Phosphatase: 97 U/L (ref 38–126)
Anion gap: 6 (ref 5–15)
BUN: 13 mg/dL (ref 8–23)
CO2: 30 mmol/L (ref 22–32)
Calcium: 9.8 mg/dL (ref 8.9–10.3)
Chloride: 103 mmol/L (ref 98–111)
Creatinine, Ser: 0.99 mg/dL (ref 0.44–1.00)
GFR, Estimated: >60 mL/min (ref >60)
Glucose, Bld: 140 mg/dL — ABNORMAL HIGH (ref 70–99)
Potassium: 3.7 mmol/L (ref 3.5–5.1)
Sodium: 139 mmol/L (ref 135–145)
Total Bilirubin: 0.7 mg/dL (ref 0.3–1.2)
Total Protein: 7.5 g/dL (ref 6.5–8.1)

## 2022-05-05 NOTE — Progress Notes (Signed)
?[name]   ?[phone]) [phone] [phone]   ?Clinic Follow up Note  ? ?Patient Care Team: ?Hoyt Koch, MD as PCP - General (Internal Medicine) ?Gardenia Phlegm, NP as Nurse Practitioner (Hematology and Oncology) ?Truitt Merle, MD as Consulting Physician (Hematology) ?Kyung Rudd, MD as Consulting Physician (Radiation Oncology) ?Rolm Bookbinder, MD as Consulting Physician (General Surgery) ? ?Date of Service:  05/05/2022 ? ?CHIEF COMPLAINT: f/u of left breast cancer ? ?CURRENT THERAPY:  ?Letrozole 2.73m daily starting 06/2017. Due to significant joint pain she switched to Exemestane starting 11/2017.  ? ?ASSESSMENT & PLAN:  ?Mallory FISHBAUGHis a 66y.o. post-menopausal female with  ? ?1. Malignant neoplasm of upper-outer quadrant of left breast, invasive lobular carcinoma, grade 1, pT2N0M0, ER+/PR+/HER2-, Oncotype RS low risk ?-She was diagnosed in 12/2016. She underwent left breast lumpectomy and sentinel lymph node biopsy in Feb 2018. ?-She started adjuvant letrozole in 06/2017. She experienced joint pain and headaches. She was switched to exemestane, she is tolerating exemestane very well. She will continue for a total of 10 years due to lobular histology. ?-most recent MM 04/01/22 was negative. ?-She is clinically doing well except her low back pain. Lab reviewed, her CBC and CMP are within normal limits. Her physical exam was unremarkable. There is no high clinical concern for recurrence. ?-Continue Surveillance. Next mammogram in 03/2023. ?-Continue exemestane  ?-F/u in 6 months   ?  ?2. Chronic Back Pain ?-she reports h/o back pain but has recently got worse and developed tingling down her right leg. ?-I discussed this is probably related to her arthritis, but I will order lumbar spine MRI to rule it out bone mets. ?  ?3. Hypertension, hypercholesterolemia, Hypothyroidism, Arthritis  ?-She'll follow-up with her primary care physician ?-She takes HCTZ, levothyroxine.   ?-She has had weight gain (about 15 pounds) from 09/2020-03/2021. I discussed this could be from Exemestane. I previously recommended she reduce carbohydrates in diet and increase protein and exercise. Her weight has been stable since 03/2021. ?  ?4. Osteopenia  ?-07/29/17 bone density shows osteopenia in femur neck with a T-Score of -1.1. Her 12/2019 DEXA shows slightly worsened osteopenia with lowest T-score -1.3 of femur neck. Repeat scheduled for DEXA on 08/06/22. ?-I previously discussed anti-estrogen therapy can lessen her bone density.  ?-Continue OTC vitamin D. I encouraged her to restart calcium. ?  ?  ?Plan: ?-Continue Exemestane ?-DEXA scheduled for 08/06/22 ?-lumbar MRI with and without contrast in the next few weeks, I will call her with results. ?-Lab and f/u in 6 months ? ? ?No problem-specific Assessment & Plan notes found for this encounter. ? ? ?SUMMARY OF ONCOLOGIC HISTORY: ?Oncology History Overview Note  ?Cancer Staging ?Malignant neoplasm of upper-outer quadrant of left breast in female, estrogen receptor positive ([facility] ?Staging form: Breast, AJCC 8th Edition ?- Clinical stage from 01/15/2017: Stage IA (cT1c, cN0, cM0, G1, ER: Positive, PR: Positive, HER2: Negative) - Signed by YTruitt Merle MD on 01/27/2017 ? ?  ?Malignant neoplasm of upper-outer quadrant of left breast in female, estrogen receptor positive ([facility] [place]  ?01/15/2017 Initial Biopsy  ? Left breast 2:00 core needle biopsy showed invasive lobular carcinoma, atypical lobular hyperplasia. Grade 1. ? ?  ?01/15/2017 Receptors her2  ? ER 90% positive, PR 90% positive, HER-2 negative, Ki-67 1% ? ?  ?01/15/2017 Mammogram  ? Bilateral diagnostic mammogram and ultrasound showed irregular hypoechoic mass in the left breast 2:00 position, 9 cm from the nipple, measuring 1.2 x 1.0 x 0.8 cm,  no evidence of malignancy within the right breast, ultrasound of the left axilla was negative. Breast density category B ? ?  ?01/15/2017 Initial Diagnosis  ? Malignant neoplasm  of upper-outer quadrant of left breast in female, estrogen receptor positive ([place]) ? ?  ?02/16/2017 Pathology Results  ? Diagnosis ?1. Breast, lumpectomy, Left ?- INVASIVE GRADE I LOBULAR CARCINOMA. ?- TUMOR IS ESTIMATED TO SPAN AT LEAST 2.1 CM IN GREATEST DIMENSION. ?- ASSOCIATED LOBULAR CARCINOMA IN SITU AND ATYPICAL LOBULAR HYPERPLASIA. ?- INVASIVE TUMOR IS FOCALLY LESS THAN 0.1 CM TO LATERAL MARGIN BUT INKED MARGINAL SURFACE IS ?NEGATIVE FOR TUMOR. ?- INITIAL ANTERIOR MARGIN CANNOT BE EFFECTIVELY CLEARED OF INVASIVE TUMOR, PLEASE SEE SPECIMEN #[id number] ?FOR FINAL MARGIN STATUS. ?- SEE ONCOLOGY TEMPLATE. ?2. 4 left axillary sentinel lymph nodes were negative ?3. Additional resection of left posterior, superior, anterior and inferior margins were benign.  ? ?  ?02/16/2017 Surgery  ? breast lumpectomy with radioactive seed and axillary sentinel lymph node biposy with Dr. Donne Hazel ? ?  ?02/16/2017 Oncotype testing  ? Testing resulted in reccurance score of 12. ? ?  ?02/16/2017 Pathology Results  ? Left breast lumpectomy showed a 2.1 cm invasive grade 1 lobular carcinoma, associated LCIS and atypical lobular hyperplasia. Surgical margins were negative. 4 sentinel lymph nodes were negative. ? ?  ?02/16/2017 Oncotype testing  ? RS 12, low risk, predicts 10 year risk of distant recurrence 8% with tamoxifen, adjuvant chemotherapy was not recommended. ? ?  ?03/09/2017 Imaging  ?  ?IMPRESSION: ?Large complex fluid collection extending throughout the upper left ?breast, 3:00 to 9:00 axes, at posterior depth, presumed hematoma, ?possibly multiloculated. ? ?  ?03/18/2017 Surgery  ?  ?Evacuation seroma left bresat with Dr. Donne Hazel  ? ?  ?04/21/2017 - 06/08/2017 Radiation Therapy  ? Patient received radiation treatment with Dr. Lisbeth Renshaw ? ?  ?06/2017 -  Anti-estrogen oral therapy  ? Letrozole daily. Switch to Exemestane in 11/2017 due to joint pain.  ? ?  ?09/03/2017 Survivorship  ? Survivorship Clinic with NP  ? ?  ?01/19/2018 Mammogram  ?  IMPRESSION: ?No mammographic evidence of breast malignancy. ? ?  ? ? ? ?INTERVAL HISTORY:  ?[name] is here for a follow up of breast cancer. She was last seen by me on 10/11/21. She presents to the clinic alone. ?She reports she is doing well overall. She denies any side effects from exemestane. ?She reports she has pain to her back (with tingling on the right leg) causing difficulty even walking. She notes this has limited her ability to exercise. She reports she has a history of chronic back pain but feels it is getting worse (and the tingling is new). ?  ?All other systems were reviewed with the patient and are negative. ? ?MEDICAL HISTORY:  ?Past Medical History:  ?Diagnosis Date  ? Arthritis   ? Breast cancer ([facility]) 01/15/2017  ? left breast  ? Cancer ([place])   ? GERD (gastroesophageal reflux disease)   ? HLD (hyperlipidemia)   ? Hypertension   ? Hypothyroidism   ? Obesity   ? Overactive bladder   ? Palpitation   ? Personal history of radiation therapy   ? Thyroid disease   ? ? ?SURGICAL HISTORY: ?Past Surgical History:  ?Procedure Laterality Date  ? bladder stem stretch  1966  ? BREAST LUMPECTOMY Left 02/16/2017  ? BREAST LUMPECTOMY WITH RADIOACTIVE SEED AND SENTINEL LYMPH NODE BIOPSY Left 02/16/2017  ? Procedure: BREAST LUMPECTOMY WITH RADIOACTIVE SEED AND AXILLARY SENTINEL LYMPH NODE BIOPSY;  Surgeon: Rolm Bookbinder, MD;  Location: [facility];  Service: General;  Laterality: Left;  ? EVACUATION BREAST HEMATOMA Left 03/18/2017  ? Procedure: EVACUATION SEROMA LEFT BREAST;  Surgeon: Rolm Bookbinder, MD;  Location: [facility];  Service: General;  Laterality: Left;  ? JOINT REPLACEMENT    ? KNEE ARTHROSCOPY    ? LEFT  ? NOSE SURGERY  1980  ? SHOULDER SURGERY Left 2007  ? TONSILLECTOMY  1964  ? TOTAL HIP ARTHROPLASTY Left 10/23/2015  ? Procedure: TOTAL HIP ARTHROPLASTY ANTERIOR APPROACH;  Surgeon: Melrose Nakayama, MD;  Location: [facility];  Service: Orthopedics;  Laterality: Left;   ? ? ?I have reviewed the social history and family history with the patient and they are unchanged from previous note. ? ?ALLERGIES:  has no allergies on file. ? ?MEDICATIONS:  ?Current Outpatient Medications

## 2022-05-12 ENCOUNTER — Ambulatory Visit: Payer: BC Managed Care – PPO | Admitting: Internal Medicine

## 2022-05-12 ENCOUNTER — Encounter: Payer: Self-pay | Admitting: Internal Medicine

## 2022-05-12 DIAGNOSIS — M5441 Lumbago with sciatica, right side: Secondary | ICD-10-CM | POA: Diagnosis not present

## 2022-05-12 MED ORDER — PANTOPRAZOLE SODIUM 40 MG PO TBEC: 40.0000 mg | DELAYED_RELEASE_TABLET | Freq: Every day | ORAL | 3 refills | Status: DC

## 2022-05-12 MED ORDER — PREDNISONE 20 MG PO TABS: 40.0000 mg | ORAL_TABLET | Freq: Every day | ORAL | 0 refills | Status: DC

## 2022-05-12 NOTE — Progress Notes (Signed)
   Subjective:   Patient ID: [name], female    DOB: 1956-01-11, 66 y.o.   MRN: [id number]  HPI The patient is a 66 YO female coming in for low back pain down her right leg with tingling. Prior cancer and her oncologist is getting MRI.   Review of Systems  Constitutional:  Positive for activity change. Negative for appetite change, chills, fatigue, fever and unexpected weight change.  Respiratory: Negative.    Cardiovascular: Negative.   Gastrointestinal: Negative.   Musculoskeletal:  Positive for arthralgias, back pain and myalgias. Negative for gait problem and joint swelling.  Skin: Negative.   Neurological: Negative.    Objective:  Physical Exam Constitutional:      Appearance: Normal appearance.  HENT:     Head: Normocephalic.  Cardiovascular:     Rate and Rhythm: Normal rate and regular rhythm.  Pulmonary:     Effort: Pulmonary effort is normal.  Musculoskeletal:        General: Tenderness present. Normal range of motion.  Skin:    General: Skin is warm and dry.  Neurological:     General: No focal deficit present.     Mental Status: She is alert and oriented to person, place, and time.    Vitals:   05/12/22 0800  BP: 122/80  Pulse: 75  Resp: 18  SpO2: 98%  Weight: 226 lb 6.4 oz (102.7 kg)  Height: '5\' 6"'$  (1.676 m)    Assessment & Plan:

## 2022-05-12 NOTE — Patient Instructions (Signed)
We have sent in the prednisone to take 2 pills daily for 5 days.   

## 2022-05-15 ENCOUNTER — Ambulatory Visit (HOSPITAL_COMMUNITY)
Admission: RE | Admit: 2022-05-15 | Discharge: 2022-05-15 | Disposition: A | Discharge status: Home / Self Care | Payer: BC Managed Care – PPO | Source: Ambulatory Visit | Attending: Hematology | Admitting: Hematology

## 2022-05-15 DIAGNOSIS — C50412 Malignant neoplasm of upper-outer quadrant of left female breast: Secondary | ICD-10-CM | POA: Diagnosis present

## 2022-05-15 DIAGNOSIS — Z170 Estrogen receptor positive status [ER+]: Secondary | ICD-10-CM | POA: Diagnosis present

## 2022-05-15 MED ORDER — GADOBUTROL 1 MMOL/ML IV SOLN
10.0000 mL | Freq: Once | INTRAVENOUS | Status: AC | PRN
  Administered 2022-05-15: 10 mL via INTRAVENOUS

## 2022-05-16 ENCOUNTER — Ambulatory Visit (HOSPITAL_COMMUNITY): Payer: BC Managed Care – PPO

## 2022-05-16 DIAGNOSIS — M5441 Lumbago with sciatica, right side: Secondary | ICD-10-CM | POA: Insufficient documentation

## 2022-05-16 NOTE — Assessment & Plan Note (Signed)
Agree with MRI and will follow up findings. Will try prednisone burst rx done today. Depending on MRI results can try PT.

## 2022-05-26 ENCOUNTER — Other Ambulatory Visit: Payer: Self-pay | Admitting: Internal Medicine

## 2022-07-03 ENCOUNTER — Encounter (HOSPITAL_BASED_OUTPATIENT_CLINIC_OR_DEPARTMENT_OTHER): Payer: Self-pay | Admitting: Emergency Medicine

## 2022-07-03 ENCOUNTER — Other Ambulatory Visit: Payer: Self-pay

## 2022-07-03 ENCOUNTER — Emergency Department (HOSPITAL_BASED_OUTPATIENT_CLINIC_OR_DEPARTMENT_OTHER)
Admission: EM | Admit: 2022-07-03 | Discharge: 2022-07-04 | Disposition: A | Discharge status: Home / Self Care | Payer: BC Managed Care – PPO | Attending: Emergency Medicine | Admitting: Emergency Medicine

## 2022-07-03 DIAGNOSIS — M5417 Radiculopathy, lumbosacral region: Secondary | ICD-10-CM | POA: Diagnosis not present

## 2022-07-03 DIAGNOSIS — I10 Essential (primary) hypertension: Secondary | ICD-10-CM | POA: Insufficient documentation

## 2022-07-03 DIAGNOSIS — Z79899 Other long term (current) drug therapy: Secondary | ICD-10-CM | POA: Insufficient documentation

## 2022-07-03 DIAGNOSIS — M545 Low back pain, unspecified: Secondary | ICD-10-CM | POA: Diagnosis present

## 2022-07-03 DIAGNOSIS — Z96642 Presence of left artificial hip joint: Secondary | ICD-10-CM | POA: Insufficient documentation

## 2022-07-03 DIAGNOSIS — E039 Hypothyroidism, unspecified: Secondary | ICD-10-CM | POA: Diagnosis not present

## 2022-07-03 DIAGNOSIS — Z853 Personal history of malignant neoplasm of breast: Secondary | ICD-10-CM | POA: Diagnosis not present

## 2022-07-03 MED ORDER — HYDROMORPHONE HCL 1 MG/ML IJ SOLN
2.0000 mg | Freq: Once | INTRAMUSCULAR | Status: AC
  Administered 2022-07-03: 2 mg via INTRAMUSCULAR
  Filled 2022-07-03: qty 2

## 2022-07-03 MED ORDER — ONDANSETRON 4 MG PO TBDP
8.0000 mg | ORAL_TABLET | Freq: Once | ORAL | Status: AC
  Administered 2022-07-03: 8 mg via ORAL
  Filled 2022-07-03: qty 2

## 2022-07-03 NOTE — ED Triage Notes (Signed)
Patient c/o left lower leg pain x 1 day, woke yesterday morning with left hip pain and felt like her leg was numb. States she was scheduled for injection to lower back yesterday, received injection with relief of pain to hip, but has had increased pain to lower leg with difficulty walking.

## 2022-07-03 NOTE — ED Provider Notes (Signed)
[facility] DEPT [facility] Provider Note: Mallory Spurling, [name], [name]  CSN: [id number] MRN: [id number] ARRIVAL: 07/03/22 at 2119 ROOM: [place]  Leg Pain   HISTORY OF PRESENT ILLNESS  07/03/22 11:23 PM [name] is a 66 y.o. female with bilateral radiculopathy.  She has had bilateral low back pain with weakness of her left lower extremity.  She saw her orthopedist yesterday and had an injection in her left back for treatment of L3-4 and L4-5.  Since the injection she has had an increase in pain in the left L5 dermatome with an increase in left lower extremity weakness, particularly dorsiflexion of the left foot.  She rates her pain as an 8 out of 10.    Past Medical History:  Diagnosis Date   Arthritis    Breast cancer ([facility]) 01/15/2017   left breast   Cancer ([facility])    GERD (gastroesophageal reflux disease)    HLD (hyperlipidemia)    Hypertension    Hypothyroidism    Obesity    Overactive bladder    Palpitation    Personal history of radiation therapy    Thyroid disease     Past Surgical History:  Procedure Laterality Date   bladder stem stretch  1966   BREAST LUMPECTOMY Left 02/16/2017   BREAST LUMPECTOMY WITH RADIOACTIVE SEED AND SENTINEL LYMPH NODE BIOPSY Left 02/16/2017   Procedure: BREAST LUMPECTOMY WITH RADIOACTIVE SEED AND AXILLARY SENTINEL LYMPH NODE BIOPSY;  Surgeon: Rolm Bookbinder, [name];  Location: [facility];  Service: General;  Laterality: Left;   EVACUATION BREAST HEMATOMA Left 03/18/2017   Procedure: EVACUATION SEROMA LEFT BREAST;  Surgeon: Rolm Bookbinder, [name];  Location: [facility];  Service: General;  Laterality: Left;   JOINT REPLACEMENT     KNEE ARTHROSCOPY     LEFT   NOSE SURGERY  1980   SHOULDER SURGERY Left 2007   [place] Left 10/23/2015   Procedure: TOTAL HIP ARTHROPLASTY ANTERIOR APPROACH;  Surgeon: Melrose Nakayama, [name];  Location: [facility];  Service: Orthopedics;   Laterality: Left;    Family History  Problem Relation Age of Onset   Heart Problems Father        cardiac arrest   Heart attack Mother    Heart disease Mother    Cancer Mother    Breast cancer Mother 63   Cancer Maternal Uncle 37       colon cancer    Cancer Maternal Grandmother        bladder cancer     Social History   Tobacco Use   Smoking status: Never   Smokeless tobacco: Never  Substance Use Topics   Alcohol use: No   Drug use: No    Prior to Admission medications   Medication Sig Start Date End Date Taking? Authorizing Provider  oxyCODONE-acetaminophen (PERCOCET) 5-325 MG tablet Take 1-2 tablets by mouth every 6 (six) hours as needed for severe pain. 07/04/22  Yes Darion Juhasz, [name]  benzonatate (TESSALON) 200 MG capsule Take 1 capsule (200 mg total) by mouth 2 (two) times daily as needed for cough. 02/05/22   Terrilyn Saver, NP  clobetasol cream (TEMOVATE) 1.02 % APPLY 1 APPLICATION TOPICALLY 2 (TWO) TIMES DAILY AS NEEDED (ITCHING). 05/26/22   Hoyt Koch, [name]  exemestane (AROMASIN) 25 MG tablet Take 1 tablet (25 mg total) by mouth daily after breakfast. 10/11/21   Truitt Merle, [name]  gabapentin (NEURONTIN) 100 MG capsule Take  1 capsule (100 mg total) by mouth 3 (three) times daily. 12/17/21   Benay Pike, [name]  hydrochlorothiazide (HYDRODIURIL) 25 MG tablet TAKE 1 TABLET BY MOUTH EVERY DAY 09/04/21   Hoyt Koch, [name]  levothyroxine (SYNTHROID) 75 MCG tablet Take 1 tablet (75 mcg total) by mouth daily. 08/16/21   Hoyt Koch, [name]  metoprolol succinate (TOPROL-XL) 25 MG 24 hr tablet TAKE 1 TABLET BY MOUTH EVERY DAY 09/04/21   Hoyt Koch, [name]  neomycin-polymyxin-hydrocortisone (CORTISPORIN) OTIC solution Apply 1-2 drops to toe after soaking BID 04/03/21   Wallene Huh, DPM  pantoprazole (PROTONIX) 40 MG tablet Take 1 tablet (40 mg total) by mouth daily. 05/12/22   Hoyt Koch, [name]  predniSONE (DELTASONE) 20 MG tablet Take 2 tablets (40  mg total) by mouth daily with breakfast. 05/12/22   Hoyt Koch, [name]  simvastatin (ZOCOR) 20 MG tablet TAKE 1 TABLET BY MOUTH EVERY DAY 09/04/21   Hoyt Koch, [name]    Allergies Patient has no known allergies.   REVIEW OF SYSTEMS  Negative except as noted here or in the History of Present Illness.   PHYSICAL EXAMINATION  Initial Vital Signs Blood pressure (!) 151/76, pulse 74, temperature 98.3 F (36.8 C), temperature source Oral, resp. rate 18, height '5\' 6"'$  (1.676 m), weight 104.8 kg, SpO2 96 %.  Examination General: Well-developed, well-nourished female in no acute distress; appearance consistent with age of record HENT: normocephalic; atraumatic Eyes: Normal appearance Neck: supple Heart: regular rate and rhythm Lungs: clear to auscultation bilaterally Abdomen: soft; nondistended; nontender; bowel sounds present Extremities: No deformity; decreased range of motion of left hip due to weakness and pain Neurologic: Awake, alert and oriented; motor function intact in all extremities and symmetric except plus 4 out of 5 strength in left lower extremity with decreased dorsiflexion of the left foot; no facial droop Skin: Warm and dry Psychiatric: Normal mood and affect   RESULTS  Summary of this visit's results, reviewed and interpreted by myself:   EKG Interpretation  Date/Time:    Ventricular Rate:    PR Interval:    QRS Duration:   QT Interval:    QTC Calculation:   R Axis:     Text Interpretation:         Laboratory Studies: No results found for this or any previous visit (from the past 24 hour(s)). Imaging Studies: No results found.  ED COURSE and MDM  Nursing notes, initial and subsequent vitals signs, including pulse oximetry, reviewed and interpreted by myself.  Vitals:   07/03/22 2150 07/03/22 2151 07/04/22 0000  BP:  (!) 151/76 (!) 143/81  Pulse:  74 72  Resp:  18 17  Temp:  98.3 F (36.8 C)   TempSrc:  Oral   SpO2:  96% 99%   Weight: 104.8 kg    Height: '5\' 6"'$  (1.676 m)     Medications  ondansetron (ZOFRAN-ODT) disintegrating tablet 8 mg (8 mg Oral Given 07/03/22 2352)  HYDROmorphone (DILAUDID) injection 2 mg (2 mg Intramuscular Given 07/03/22 2349)   12:49 AM Pain improved after IM hydromorphone.  The presentation is consistent with an L5 radiculopathy on the left.  She was advised to contact her orthopedist later today and advised him of her symptoms.   PROCEDURES  Procedures   ED DIAGNOSES     ICD-10-CM   1. Lumbosacral radiculopathy at L5  M54.17          Deanie Jupiter, Jenny Reichmann, [name] 07/04/22 [phone]

## 2022-07-04 MED ORDER — OXYCODONE-ACETAMINOPHEN 5-325 MG PO TABS: 1.0000 | ORAL_TABLET | Freq: Four times a day (QID) | ORAL | 0 refills | Status: DC | PRN

## 2022-07-04 NOTE — Discharge Instructions (Signed)
Your symptoms are consistent with an L5 radiculopathy.  You are experiencing both sensory (pain in the L5 dermatome) and motor (weakness on dorsiflexion of left foot) symptoms.  Please contact your orthopedist later today and inform him of this.

## 2022-07-30 ENCOUNTER — Other Ambulatory Visit: Payer: Self-pay | Admitting: Orthopedic Surgery

## 2022-08-06 ENCOUNTER — Ambulatory Visit
Admission: RE | Admit: 2022-08-06 | Discharge: 2022-08-06 | Disposition: A | Discharge status: Home / Self Care | Payer: BC Managed Care – PPO | Source: Ambulatory Visit | Attending: Hematology | Admitting: Hematology

## 2022-08-06 DIAGNOSIS — E2839 Other primary ovarian failure: Secondary | ICD-10-CM

## 2022-08-09 ENCOUNTER — Telehealth: Payer: Self-pay | Admitting: Internal Medicine

## 2022-08-18 NOTE — Pre-Procedure Instructions (Signed)
Surgical Instructions    Your procedure is scheduled on Thursday, September 7th.  Report to [facility] Main Entrance "A" at 05:30 A.M., then check in with the Admitting office.  Call this number if you have problems the morning of surgery:  [phone]   If you have any questions prior to your surgery date call [phone]: Open Monday-Friday 8am-4pm    Remember:  Do not eat after midnight the night before your surgery  You may drink clear liquids until 04:30 AM the morning of your surgery.   Clear liquids allowed are: Water, Non-Citrus Juices (without pulp), Carbonated Beverages, Clear Tea, Black Coffee Only (NO MILK, CREAM OR POWDERED CREAMER of any kind), and Gatorade.   Patient Instructions  The night before surgery:  No food after midnight. ONLY clear liquids after midnight  The day of surgery (if you do NOT have diabetes):  Drink ONE (1) Pre-Surgery Clear Ensure by 04:30 AM the morning of surgery. Drink in one sitting. Do not sip.  This drink was given to you during your hospital  pre-op appointment visit.  Nothing else to drink after completing the  Pre-Surgery Clear Ensure.          If you have questions, please contact your surgeon's office.      Take these medicines the morning of surgery with A SIP OF WATER  exemestane (AROMASIN) levothyroxine (SYNTHROID)  metoprolol succinate (TOPROL-XL)  pantoprazole (PROTONIX)  simvastatin (ZOCOR)  If needed: acetaminophen (TYLENOL)  methocarbamol (ROBAXIN)    As of today, STOP taking any Aspirin (unless otherwise instructed by your surgeon) Aleve, Naproxen, Ibuprofen, Motrin, Advil, Goody's, BC's, all herbal medications, fish oil, and all vitamins.                     Do NOT Smoke (Tobacco/Vaping) for 24 hours prior to your procedure.  If you use a CPAP at night, you may bring your mask/headgear for your overnight stay.   Contacts, glasses, piercing's, hearing aid's, dentures or partials may not be worn into  surgery, please bring cases for these belongings.    For patients admitted to the hospital, discharge time will be determined by your treatment team.   Patients discharged the day of surgery will not be allowed to drive home, and someone needs to stay with them for 24 hours.  SURGICAL WAITING ROOM VISITATION Patients having surgery or a procedure may have no more than 2 support people in the waiting area - these visitors may rotate.   Children under the age of 86 must have an adult with them who is not the patient. If the patient needs to stay at the hospital during part of their recovery, the visitor guidelines for inpatient rooms apply. Pre-op nurse will coordinate an appropriate time for 1 support person to accompany patient in pre-op.  This support person may not rotate.   Please refer to the [facility] website for the visitor guidelines for Inpatients (after your surgery is over and you are in a regular room).    Special instructions:   [place]- Preparing For Surgery  Before surgery, you can play an important role. Because skin is not sterile, your skin needs to be as free of germs as possible. You can reduce the number of germs on your skin by washing with CHG (chlorahexidine gluconate) Soap before surgery.  CHG is an antiseptic cleaner which kills germs and bonds with the skin to continue killing germs even after washing.    Oral Hygiene is  also important to reduce your risk of infection.  Remember - BRUSH YOUR TEETH THE MORNING OF SURGERY WITH YOUR REGULAR TOOTHPASTE  Please do not use if you have an allergy to CHG or antibacterial soaps. If your skin becomes reddened/irritated stop using the CHG.  Do not shave (including legs and underarms) for at least 48 hours prior to first CHG shower. It is OK to shave your face.  Please follow these instructions carefully.   Shower the NIGHT BEFORE SURGERY and the MORNING OF SURGERY  If you chose to wash your hair, wash your hair first  as usual with your normal shampoo.  After you shampoo, rinse your hair and body thoroughly to remove the shampoo.  Use CHG Soap as you would any other liquid soap. You can apply CHG directly to the skin and wash gently with a scrungie or a clean washcloth.   Apply the CHG Soap to your body ONLY FROM THE NECK DOWN.  Do not use on open wounds or open sores. Avoid contact with your eyes, ears, mouth and genitals (private parts). Wash Face and genitals (private parts)  with your normal soap.   Wash thoroughly, paying special attention to the area where your surgery will be performed.  Thoroughly rinse your body with warm water from the neck down.  DO NOT shower/wash with your normal soap after using and rinsing off the CHG Soap.  Pat yourself dry with a CLEAN TOWEL.  Wear CLEAN PAJAMAS to bed the night before surgery  Place CLEAN SHEETS on your bed the night before your surgery  DO NOT SLEEP WITH PETS.   Day of Surgery: Take a shower with CHG soap. Do not wear jewelry or makeup Do not wear lotions, powders, perfumes, or deodorant. Do not shave 48 hours prior to surgery.   Do not bring valuables to the hospital.  [facility] is not responsible for any belongings or valuables. Do not wear nail polish, gel polish, artificial nails, or any other type of covering on natural nails (fingers and toes) If you have artificial nails or gel coating that need to be removed by a nail salon, please have this removed prior to surgery. Artificial nails or gel coating may interfere with anesthesia's ability to adequately monitor your vital signs. Wear Clean/Comfortable clothing the morning of surgery Remember to brush your teeth WITH YOUR REGULAR TOOTHPASTE.   Please read over the following fact sheets that you were given.    If you received a COVID test during your pre-op visit  it is requested that you wear a mask when out in public, stay away from anyone that may not be feeling well and notify  your surgeon if you develop symptoms. If you have been in contact with anyone that has tested positive in the last 10 days please notify you surgeon.

## 2022-08-18 NOTE — Telephone Encounter (Signed)
Pt is requesting a refill on: levothyroxine (SYNTHROID) 75 MCG tablet  Pharmacy: [id number] [place] [place]/[place]23

## 2022-08-19 ENCOUNTER — Encounter (HOSPITAL_COMMUNITY): Payer: Self-pay

## 2022-08-19 ENCOUNTER — Other Ambulatory Visit: Payer: Self-pay

## 2022-08-19 ENCOUNTER — Encounter (HOSPITAL_COMMUNITY)
Admission: RE | Admit: 2022-08-19 | Discharge: 2022-08-19 | Disposition: A | Discharge status: Home / Self Care | Payer: BC Managed Care – PPO | Source: Ambulatory Visit | Attending: Orthopedic Surgery | Admitting: Orthopedic Surgery

## 2022-08-19 VITALS — BP 138/83 | HR 72 | Temp 97.9°F | Resp 17 | Ht 66.0 in | Wt 235.2 lb

## 2022-08-19 DIAGNOSIS — I10 Essential (primary) hypertension: Secondary | ICD-10-CM | POA: Insufficient documentation

## 2022-08-19 DIAGNOSIS — Z01818 Encounter for other preprocedural examination: Secondary | ICD-10-CM | POA: Insufficient documentation

## 2022-08-19 LAB — TYPE AND SCREEN
ABO/RH(D): O NEG
Antibody Screen: NEGATIVE

## 2022-08-19 LAB — BASIC METABOLIC PANEL
Anion gap: 9 (ref 5–15)
BUN: 13 mg/dL (ref 8–23)
CO2: 26 mmol/L (ref 22–32)
Calcium: 9.6 mg/dL (ref 8.9–10.3)
Chloride: 103 mmol/L (ref 98–111)
Creatinine, Ser: 1 mg/dL (ref 0.44–1.00)
GFR, Estimated: >60 mL/min (ref >60)
Glucose, Bld: 109 mg/dL — ABNORMAL HIGH (ref 70–99)
Potassium: 4.1 mmol/L (ref 3.5–5.1)
Sodium: 138 mmol/L (ref 135–145)

## 2022-08-19 LAB — CBC
HCT: 42 % (ref 36.0–46.0)
Hemoglobin: 13.9 g/dL (ref 12.0–15.0)
MCH: 30.1 pg (ref 26.0–34.0)
MCHC: 33.1 g/dL (ref 30.0–36.0)
MCV: 90.9 fL (ref 80.0–100.0)
Platelets: 349 10*3/uL (ref 150–400)
RBC: 4.62 MIL/uL (ref 3.87–5.11)
RDW: 13.4 % (ref 11.5–15.5)
WBC: 6.1 10*3/uL (ref 4.0–10.5)
nRBC: 0 % (ref 0.0–0.2)

## 2022-08-19 LAB — SURGICAL PCR SCREEN
MRSA, PCR: NEGATIVE
Staphylococcus aureus: NEGATIVE

## 2022-08-19 NOTE — Progress Notes (Signed)
PCP - Pricilla Holm Cardiologist - Loralie Champagne- Has not seen since 2016. Per patient Dr. Aundra Dubin told her that he did not need to see her anymore and she could be followed by her PCP.   PPM/ICD - n/a Device Orders - n/a Rep Notified - n/a  Chest x-ray - n/a EKG - 08/19/22 Stress Test - 07/24/2010 ECHO - 12/08/2012 Cardiac Cath - denies  Sleep Study - denies CPAP - n/a  Fasting Blood Sugar - Denies having diabetes Checks Blood Sugar _____ times a day- n/a  Blood Thinner Instructions: n/a Aspirin Instructions: n/a  ERAS Protcol - Clear liquids until 0430 am day of surgery PRE-SURGERY Ensure or G2- Ensure  COVID TEST- N/A   Anesthesia review: No  Patient denies shortness of breath, fever, cough and chest pain at PAT appointment   All instructions explained to the patient, with a verbal understanding of the material. Patient agrees to go over the instructions while at home for a better understanding. The opportunity to ask questions was provided.

## 2022-08-27 NOTE — Anesthesia Preprocedure Evaluation (Addendum)
Anesthesia Evaluation  Patient identified by MRN, date of birth, ID band Patient awake    Reviewed: Allergy & Precautions, NPO status , Patient's Chart, lab work & pertinent test results  Airway Mallampati: II  TM Distance: >3 FB Neck ROM: Full    Dental no notable dental hx. (+) Teeth Intact, Dental Advisory Given   Pulmonary neg pulmonary ROS   Pulmonary exam normal breath sounds clear to auscultation       Cardiovascular hypertension, Pt. on medications Normal cardiovascular exam Rhythm:Regular Rate:Normal     Neuro/Psych  Neuromuscular disease    GI/Hepatic Neg liver ROS,GERD  Medicated,,  Endo/Other  Hypothyroidism    Renal/GU Lab Results      Component                Value               Date                      CREATININE               1.00                08/19/2022                K                        4.1                 08/19/2022                    Musculoskeletal  (+) Arthritis ,    Abdominal   Peds  Hematology Lab Results      Component                Value               Date                       HGB                      13.9                08/19/2022                HCT                      42.0                08/19/2022               PLT                      349                 08/19/2022              Anesthesia Other Findings Breast CA  Reproductive/Obstetrics                             Anesthesia Physical Anesthesia Plan  ASA: 3  Anesthesia Plan: General   Post-op Pain Management: Ketamine IV*, Lidocaine infusion*, Tylenol PO (pre-op)* and Dilaudid IV   Induction: Intravenous  PONV Risk Score and Plan: 4 or greater and Treatment may vary due to age or  medical condition, Ondansetron, Midazolam and Dexamethasone  Airway Management Planned: Oral ETT  Additional Equipment: None  Intra-op Plan:   Post-operative Plan: Extubation in OR  Informed Consent: I  have reviewed the patients History and Physical, chart, labs and discussed the procedure including the risks, benefits and alternatives for the proposed anesthesia with the patient or authorized representative who has indicated his/her understanding and acceptance.     Dental advisory given  Plan Discussed with: CRNA and Anesthesiologist  Anesthesia Plan Comments:         Anesthesia Quick Evaluation

## 2022-08-28 ENCOUNTER — Ambulatory Visit (HOSPITAL_COMMUNITY): Payer: BC Managed Care – PPO

## 2022-08-28 ENCOUNTER — Other Ambulatory Visit: Payer: Self-pay

## 2022-08-28 ENCOUNTER — Ambulatory Visit (HOSPITAL_COMMUNITY)
Admission: RE | Disposition: A | Discharge status: Home / Self Care | Payer: Self-pay | Source: Ambulatory Visit | Attending: Orthopedic Surgery

## 2022-08-28 ENCOUNTER — Ambulatory Visit (HOSPITAL_COMMUNITY): Payer: BC Managed Care – PPO | Admitting: Anesthesiology

## 2022-08-28 ENCOUNTER — Observation Stay (HOSPITAL_COMMUNITY)
Admission: RE | Admit: 2022-08-28 | Discharge: 2022-08-29 | Disposition: A | Discharge status: Home / Self Care | Payer: BC Managed Care – PPO | Source: Ambulatory Visit | Attending: Orthopedic Surgery | Admitting: Orthopedic Surgery

## 2022-08-28 ENCOUNTER — Encounter (HOSPITAL_COMMUNITY): Payer: Self-pay | Admitting: Orthopedic Surgery

## 2022-08-28 DIAGNOSIS — Z96642 Presence of left artificial hip joint: Secondary | ICD-10-CM | POA: Diagnosis not present

## 2022-08-28 DIAGNOSIS — M5416 Radiculopathy, lumbar region: Secondary | ICD-10-CM | POA: Insufficient documentation

## 2022-08-28 DIAGNOSIS — E039 Hypothyroidism, unspecified: Secondary | ICD-10-CM | POA: Insufficient documentation

## 2022-08-28 DIAGNOSIS — I10 Essential (primary) hypertension: Secondary | ICD-10-CM | POA: Diagnosis not present

## 2022-08-28 DIAGNOSIS — M48062 Spinal stenosis, lumbar region with neurogenic claudication: Secondary | ICD-10-CM | POA: Diagnosis present

## 2022-08-28 DIAGNOSIS — Z853 Personal history of malignant neoplasm of breast: Secondary | ICD-10-CM | POA: Diagnosis not present

## 2022-08-28 DIAGNOSIS — M5410 Radiculopathy, site unspecified: Secondary | ICD-10-CM | POA: Diagnosis present

## 2022-08-28 DIAGNOSIS — M4316 Spondylolisthesis, lumbar region: Secondary | ICD-10-CM | POA: Diagnosis not present

## 2022-08-28 DIAGNOSIS — Z79899 Other long term (current) drug therapy: Secondary | ICD-10-CM | POA: Diagnosis not present

## 2022-08-28 HISTORY — PX: TRANSFORAMINAL LUMBAR INTERBODY FUSION (TLIF) WITH PEDICLE SCREW FIXATION 2 LEVEL: SHX6142

## 2022-08-28 SURGERY — TRANSFORAMINAL LUMBAR INTERBODY FUSION (TLIF) WITH PEDICLE SCREW FIXATION 2 LEVEL
Anesthesia: General | Laterality: Left

## 2022-08-28 MED ORDER — ACETAMINOPHEN 500 MG PO TABS: 500.0000 mg | ORAL_TABLET | Freq: Four times a day (QID) | ORAL | Status: DC | PRN

## 2022-08-28 MED ORDER — SIMVASTATIN 20 MG PO TABS: 20.0000 mg | ORAL_TABLET | Freq: Every day | ORAL | Status: DC

## 2022-08-28 MED ORDER — SENNOSIDES-DOCUSATE SODIUM 8.6-50 MG PO TABS: 1.0000 | ORAL_TABLET | Freq: Every evening | ORAL | Status: DC | PRN

## 2022-08-28 MED ORDER — PROPOFOL 10 MG/ML IV BOLUS
INTRAVENOUS | Status: AC
  Filled 2022-08-28: qty 20

## 2022-08-28 MED ORDER — MENTHOL 3 MG MT LOZG
1.0000 | LOZENGE | OROMUCOSAL | Status: DC | PRN
Start: 2022-08-28 — End: 2022-08-29

## 2022-08-28 MED ORDER — CEFAZOLIN SODIUM-DEXTROSE 2-4 GM/100ML-% IV SOLN
2.0000 g | INTRAVENOUS | Status: AC
  Administered 2022-08-28 (×2): 2 g via INTRAVENOUS
  Filled 2022-08-28: qty 100

## 2022-08-28 MED ORDER — BUPIVACAINE-EPINEPHRINE (PF) 0.25% -1:200000 IJ SOLN
INTRAMUSCULAR | Status: AC
  Filled 2022-08-28: qty 30

## 2022-08-28 MED ORDER — ONDANSETRON HCL 4 MG/2ML IJ SOLN
INTRAMUSCULAR | Status: DC | PRN
  Administered 2022-08-28: 4 mg via INTRAVENOUS

## 2022-08-28 MED ORDER — METHOCARBAMOL 1000 MG/10ML IJ SOLN: 500.0000 mg | Freq: Four times a day (QID) | INTRAVENOUS | Status: DC | PRN

## 2022-08-28 MED ORDER — 0.9 % SODIUM CHLORIDE (POUR BTL) OPTIME
TOPICAL | Status: DC | PRN
  Administered 2022-08-28: 2000 mL

## 2022-08-28 MED ORDER — CHLORHEXIDINE GLUCONATE 0.12 % MT SOLN
15.0000 mL | Freq: Once | OROMUCOSAL | Status: AC
Start: 2022-08-28 — End: 2022-08-28
  Administered 2022-08-28: 15 mL via OROMUCOSAL
  Filled 2022-08-28: qty 15

## 2022-08-28 MED ORDER — PHENYLEPHRINE HCL-NACL 20-0.9 MG/250ML-% IV SOLN
INTRAVENOUS | Status: DC | PRN
  Administered 2022-08-28: 50 ug/min via INTRAVENOUS

## 2022-08-28 MED ORDER — DEXAMETHASONE SODIUM PHOSPHATE 10 MG/ML IJ SOLN
INTRAMUSCULAR | Status: AC
Start: 2022-08-28 — End: ?
  Filled 2022-08-28: qty 1

## 2022-08-28 MED ORDER — OXYCODONE HCL 5 MG/5ML PO SOLN: 5.0000 mg | Freq: Once | ORAL | Status: DC | PRN

## 2022-08-28 MED ORDER — ROCURONIUM BROMIDE 10 MG/ML (PF) SYRINGE
PREFILLED_SYRINGE | INTRAVENOUS | Status: AC
  Filled 2022-08-28: qty 10

## 2022-08-28 MED ORDER — AMISULPRIDE (ANTIEMETIC) 5 MG/2ML IV SOLN: 10.0000 mg | Freq: Once | INTRAVENOUS | Status: DC | PRN

## 2022-08-28 MED ORDER — MORPHINE SULFATE (PF) 2 MG/ML IV SOLN: 1.0000 mg | INTRAVENOUS | Status: DC | PRN

## 2022-08-28 MED ORDER — LIDOCAINE 2% (20 MG/ML) 5 ML SYRINGE
INTRAMUSCULAR | Status: AC
Start: 2022-08-28 — End: ?
  Filled 2022-08-28: qty 5

## 2022-08-28 MED ORDER — ACETAMINOPHEN 325 MG PO TABS: 650.0000 mg | ORAL_TABLET | ORAL | Status: DC | PRN

## 2022-08-28 MED ORDER — HYDROMORPHONE HCL 1 MG/ML IJ SOLN
INTRAMUSCULAR | Status: AC
  Filled 2022-08-28: qty 1

## 2022-08-28 MED ORDER — ACETAMINOPHEN 10 MG/ML IV SOLN
1000.0000 mg | Freq: Once | INTRAVENOUS | Status: DC | PRN
Start: 2022-08-28 — End: 2022-08-28

## 2022-08-28 MED ORDER — SODIUM CHLORIDE 0.9% FLUSH
3.0000 mL | Freq: Two times a day (BID) | INTRAVENOUS | Status: DC
  Administered 2022-08-28: 3 mL via INTRAVENOUS

## 2022-08-28 MED ORDER — DOCUSATE SODIUM 100 MG PO CAPS
100.0000 mg | ORAL_CAPSULE | Freq: Two times a day (BID) | ORAL | Status: DC
  Administered 2022-08-28 (×2): 100 mg via ORAL
  Filled 2022-08-28 (×2): qty 1

## 2022-08-28 MED ORDER — CYCLOBENZAPRINE HCL 5 MG PO TABS: 5.0000 mg | ORAL_TABLET | Freq: Every evening | ORAL | Status: DC | PRN

## 2022-08-28 MED ORDER — BUPIVACAINE-EPINEPHRINE 0.25% -1:200000 IJ SOLN
INTRAMUSCULAR | Status: DC | PRN
  Administered 2022-08-28: 9 mL
  Administered 2022-08-28: 20 mL

## 2022-08-28 MED ORDER — METHOCARBAMOL 500 MG PO TABS
500.0000 mg | ORAL_TABLET | Freq: Four times a day (QID) | ORAL | Status: DC | PRN
  Administered 2022-08-28 – 2022-08-29 (×3): 500 mg via ORAL
  Filled 2022-08-28 (×3): qty 1

## 2022-08-28 MED ORDER — HYDROCHLOROTHIAZIDE 25 MG PO TABS
25.0000 mg | ORAL_TABLET | Freq: Every day | ORAL | Status: DC
  Administered 2022-08-28: 25 mg via ORAL
  Filled 2022-08-28: qty 1

## 2022-08-28 MED ORDER — FENTANYL CITRATE (PF) 250 MCG/5ML IJ SOLN
INTRAMUSCULAR | Status: AC
  Filled 2022-08-28: qty 5

## 2022-08-28 MED ORDER — ALBUMIN HUMAN 5 % IV SOLN: INTRAVENOUS | Status: DC | PRN

## 2022-08-28 MED ORDER — ONDANSETRON HCL 4 MG PO TABS
4.0000 mg | ORAL_TABLET | Freq: Four times a day (QID) | ORAL | Status: DC | PRN
  Administered 2022-08-28 – 2022-08-29 (×3): 4 mg via ORAL
  Filled 2022-08-28 (×3): qty 1

## 2022-08-28 MED ORDER — MIDAZOLAM HCL 2 MG/2ML IJ SOLN
INTRAMUSCULAR | Status: DC | PRN
  Administered 2022-08-28: 2 mg via INTRAVENOUS

## 2022-08-28 MED ORDER — PHENOL 1.4 % MT LIQD
1.0000 | OROMUCOSAL | Status: DC | PRN
Start: 2022-08-28 — End: 2022-08-29

## 2022-08-28 MED ORDER — SODIUM CHLORIDE 0.9% FLUSH: 3.0000 mL | INTRAVENOUS | Status: DC | PRN

## 2022-08-28 MED ORDER — BUPIVACAINE LIPOSOME 1.3 % IJ SUSP
INTRAMUSCULAR | Status: DC | PRN
  Administered 2022-08-28: 20 mL

## 2022-08-28 MED ORDER — THROMBIN (RECOMBINANT) 20000 UNITS EX SOLR
CUTANEOUS | Status: AC
  Filled 2022-08-28: qty 20000

## 2022-08-28 MED ORDER — ACETAMINOPHEN 650 MG RE SUPP: 650.0000 mg | RECTAL | Status: DC | PRN

## 2022-08-28 MED ORDER — PROPOFOL 10 MG/ML IV BOLUS
INTRAVENOUS | Status: DC | PRN
  Administered 2022-08-28: 150 mg via INTRAVENOUS

## 2022-08-28 MED ORDER — VITAMIN D 25 MCG (1000 UNIT) PO TABS
1000.0000 [IU] | ORAL_TABLET | Freq: Every day | ORAL | Status: DC
  Administered 2022-08-28: 1000 [IU] via ORAL
  Filled 2022-08-28: qty 1

## 2022-08-28 MED ORDER — POTASSIUM CHLORIDE IN NACL 20-0.9 MEQ/L-% IV SOLN: INTRAVENOUS | Status: DC

## 2022-08-28 MED ORDER — ONDANSETRON HCL 4 MG/2ML IJ SOLN
INTRAMUSCULAR | Status: AC
Start: 2022-08-28 — End: ?
  Filled 2022-08-28: qty 2

## 2022-08-28 MED ORDER — METOPROLOL SUCCINATE ER 25 MG PO TB24: 25.0000 mg | ORAL_TABLET | Freq: Every day | ORAL | Status: DC

## 2022-08-28 MED ORDER — ZOLPIDEM TARTRATE 5 MG PO TABS: 5.0000 mg | ORAL_TABLET | Freq: Every evening | ORAL | Status: DC | PRN

## 2022-08-28 MED ORDER — ROCURONIUM BROMIDE 10 MG/ML (PF) SYRINGE
PREFILLED_SYRINGE | INTRAVENOUS | Status: DC | PRN
  Administered 2022-08-28: 100 mg via INTRAVENOUS
  Administered 2022-08-28 (×2): 20 mg via INTRAVENOUS

## 2022-08-28 MED ORDER — HYDROMORPHONE HCL 1 MG/ML IJ SOLN
0.2500 mg | INTRAMUSCULAR | Status: DC | PRN
  Administered 2022-08-28 (×2): 0.25 mg via INTRAVENOUS

## 2022-08-28 MED ORDER — KETAMINE HCL 50 MG/5ML IJ SOSY
PREFILLED_SYRINGE | INTRAMUSCULAR | Status: AC
  Filled 2022-08-28: qty 5

## 2022-08-28 MED ORDER — ACETAMINOPHEN 10 MG/ML IV SOLN
INTRAVENOUS | Status: AC
Start: 2022-08-28 — End: ?
  Filled 2022-08-28: qty 100

## 2022-08-28 MED ORDER — CEFAZOLIN SODIUM-DEXTROSE 2-4 GM/100ML-% IV SOLN
2.0000 g | Freq: Three times a day (TID) | INTRAVENOUS | Status: AC
  Administered 2022-08-28 (×2): 2 g via INTRAVENOUS
  Filled 2022-08-28 (×2): qty 100

## 2022-08-28 MED ORDER — ONDANSETRON HCL 4 MG/2ML IJ SOLN
4.0000 mg | Freq: Four times a day (QID) | INTRAMUSCULAR | Status: DC | PRN
  Administered 2022-08-28: 4 mg via INTRAVENOUS
  Filled 2022-08-28: qty 2

## 2022-08-28 MED ORDER — OXYCODONE-ACETAMINOPHEN 5-325 MG PO TABS
1.0000 | ORAL_TABLET | ORAL | Status: DC | PRN
  Administered 2022-08-28 – 2022-08-29 (×4): 2 via ORAL
  Filled 2022-08-28 (×4): qty 2

## 2022-08-28 MED ORDER — POVIDONE-IODINE 7.5 % EX SOLN: Freq: Once | CUTANEOUS | Status: DC

## 2022-08-28 MED ORDER — EXEMESTANE 25 MG PO TABS
25.0000 mg | ORAL_TABLET | Freq: Every day | ORAL | Status: DC
  Filled 2022-08-28: qty 1

## 2022-08-28 MED ORDER — FLEET ENEMA 7-19 GM/118ML RE ENEM
1.0000 | ENEMA | Freq: Once | RECTAL | Status: DC | PRN
Start: 2022-08-28 — End: 2022-08-29

## 2022-08-28 MED ORDER — ALUM & MAG HYDROXIDE-SIMETH 200-200-20 MG/5ML PO SUSP: 30.0000 mL | Freq: Four times a day (QID) | ORAL | Status: DC | PRN

## 2022-08-28 MED ORDER — MIDAZOLAM HCL 2 MG/2ML IJ SOLN
INTRAMUSCULAR | Status: AC
Start: 2022-08-28 — End: ?
  Filled 2022-08-28: qty 2

## 2022-08-28 MED ORDER — ROCURONIUM BROMIDE 10 MG/ML (PF) SYRINGE
PREFILLED_SYRINGE | INTRAVENOUS | Status: AC
Start: 2022-08-28 — End: ?
  Filled 2022-08-28: qty 10

## 2022-08-28 MED ORDER — LIDOCAINE 2% (20 MG/ML) 5 ML SYRINGE
INTRAMUSCULAR | Status: DC | PRN
  Administered 2022-08-28: 100 mg via INTRAVENOUS

## 2022-08-28 MED ORDER — BISACODYL 5 MG PO TBEC: 5.0000 mg | DELAYED_RELEASE_TABLET | Freq: Every day | ORAL | Status: DC | PRN

## 2022-08-28 MED ORDER — CEFAZOLIN SODIUM-DEXTROSE 2-4 GM/100ML-% IV SOLN
INTRAVENOUS | Status: AC
  Filled 2022-08-28: qty 100

## 2022-08-28 MED ORDER — LEVOTHYROXINE SODIUM 75 MCG PO TABS
75.0000 ug | ORAL_TABLET | Freq: Every day | ORAL | Status: DC
Start: 2022-08-29 — End: 2022-08-29
  Administered 2022-08-29: 75 ug via ORAL
  Filled 2022-08-28: qty 1

## 2022-08-28 MED ORDER — SODIUM CHLORIDE 0.9 % IV SOLN
250.0000 mL | INTRAVENOUS | Status: DC
  Administered 2022-08-28: 250 mL via INTRAVENOUS

## 2022-08-28 MED ORDER — LACTATED RINGERS IV SOLN: INTRAVENOUS | Status: DC

## 2022-08-28 MED ORDER — ONDANSETRON HCL 4 MG/2ML IJ SOLN: 4.0000 mg | Freq: Once | INTRAMUSCULAR | Status: DC | PRN

## 2022-08-28 MED ORDER — ORAL CARE MOUTH RINSE: 15.0000 mL | Freq: Once | OROMUCOSAL | Status: AC

## 2022-08-28 MED ORDER — SUGAMMADEX SODIUM 200 MG/2ML IV SOLN
INTRAVENOUS | Status: DC | PRN
  Administered 2022-08-28: 200 mg via INTRAVENOUS

## 2022-08-28 MED ORDER — ACETAMINOPHEN 10 MG/ML IV SOLN
INTRAVENOUS | Status: DC | PRN
  Administered 2022-08-28: 1000 mg via INTRAVENOUS

## 2022-08-28 MED ORDER — OXYCODONE HCL 5 MG PO TABS: 5.0000 mg | ORAL_TABLET | Freq: Once | ORAL | Status: DC | PRN

## 2022-08-28 MED ORDER — FENTANYL CITRATE (PF) 250 MCG/5ML IJ SOLN
INTRAMUSCULAR | Status: DC | PRN
  Administered 2022-08-28 (×2): 50 ug via INTRAVENOUS
  Administered 2022-08-28: 100 ug via INTRAVENOUS
  Administered 2022-08-28: 50 ug via INTRAVENOUS

## 2022-08-28 MED ORDER — THROMBIN 20000 UNITS EX SOLR
CUTANEOUS | Status: DC | PRN
  Administered 2022-08-28: 20 mL

## 2022-08-28 MED ORDER — PANTOPRAZOLE SODIUM 40 MG PO TBEC
40.0000 mg | DELAYED_RELEASE_TABLET | Freq: Every day | ORAL | Status: DC
  Administered 2022-08-29: 40 mg via ORAL
  Filled 2022-08-28: qty 1

## 2022-08-28 MED ORDER — BUPIVACAINE LIPOSOME 1.3 % IJ SUSP
INTRAMUSCULAR | Status: AC
  Filled 2022-08-28: qty 20

## 2022-08-28 MED ORDER — GABAPENTIN 300 MG PO CAPS
300.0000 mg | ORAL_CAPSULE | Freq: Every day | ORAL | Status: DC
  Administered 2022-08-28: 300 mg via ORAL
  Filled 2022-08-28: qty 1

## 2022-08-28 MED ORDER — KETAMINE HCL 10 MG/ML IJ SOLN
INTRAMUSCULAR | Status: DC | PRN
  Administered 2022-08-28: 20 mg via INTRAVENOUS

## 2022-08-28 SURGICAL SUPPLY — 86 items
APL SKNCLS STERI-STRIP NONHPOA (GAUZE/BANDAGES/DRESSINGS) ×1
BAG COUNTER SPONGE SURGICOUNT (BAG) ×2 IMPLANT
BAG SPNG CNTER NS LX DISP (BAG) ×1
BENZOIN TINCTURE PRP APPL 2/3 (GAUZE/BANDAGES/DRESSINGS) IMPLANT
BUR PRESCISION 1.7 ELITE (BURR) ×2 IMPLANT
BUR ROUND FLUTED 5 RND (BURR) ×2 IMPLANT
BUR ROUND PRECISION 4.0 (BURR) IMPLANT
BUR SABER RD CUTTING 3.0 (BURR) IMPLANT
CAGE SABLE 10X30 6-12 8D (Cage) IMPLANT
CANNULA GRAFT BNE VG PRE-FILL (Bone Implant) IMPLANT
CARTRIDGE OIL MAESTRO DRILL (MISCELLANEOUS) ×2 IMPLANT
CLOSURE STERI STRIP 1/2 X4 (GAUZE/BANDAGES/DRESSINGS) IMPLANT
CNTNR URN SCR LID CUP LEK RST (MISCELLANEOUS) ×2 IMPLANT
CONT SPEC 4OZ STRL OR WHT (MISCELLANEOUS) ×1
COVER MAYO STAND STRL (DRAPES) ×4 IMPLANT
COVER SURGICAL LIGHT HANDLE (MISCELLANEOUS) ×2 IMPLANT
DIFFUSER DRILL AIR PNEUMATIC (MISCELLANEOUS) ×2 IMPLANT
DISPENSER GRAFT BNE VG (MISCELLANEOUS) IMPLANT
DISPENSER VIVIGEN BONE GRAFT (MISCELLANEOUS) ×1 IMPLANT
DRAPE C-ARM 42X72 X-RAY (DRAPES) ×2 IMPLANT
DRAPE C-ARMOR (DRAPES) IMPLANT
DRAPE POUCH INSTRU U-SHP 10X18 (DRAPES) ×2 IMPLANT
DRAPE SURG 17X23 STRL (DRAPES) ×8 IMPLANT
DURAPREP 26ML APPLICATOR (WOUND CARE) ×2 IMPLANT
ELECT BLADE 4.0 EZ CLEAN MEGAD (MISCELLANEOUS) ×1
ELECT CAUTERY BLADE 6.4 (BLADE) ×2 IMPLANT
ELECT REM PT RETURN 9FT ADLT (ELECTROSURGICAL) ×1
ELECTRODE BLDE 4.0 EZ CLN MEGD (MISCELLANEOUS) ×2 IMPLANT
ELECTRODE REM PT RTRN 9FT ADLT (ELECTROSURGICAL) ×2 IMPLANT
EVACUATOR SILICONE 100CC (DRAIN) IMPLANT
FILTER STRAW FLUID ASPIR (MISCELLANEOUS) ×2 IMPLANT
GAUZE 4X4 16PLY ~~LOC~~+RFID DBL (SPONGE) ×2 IMPLANT
GAUZE SPONGE 4X4 12PLY STRL (GAUZE/BANDAGES/DRESSINGS) IMPLANT
GLOVE BIO SURGEON STRL SZ7 (GLOVE) ×2 IMPLANT
GLOVE BIO SURGEON STRL SZ8 (GLOVE) ×2 IMPLANT
GLOVE BIOGEL PI IND STRL 7.0 (GLOVE) ×2 IMPLANT
GLOVE BIOGEL PI IND STRL 8 (GLOVE) ×2 IMPLANT
GLOVE SURG ENC MOIS LTX SZ6.5 (GLOVE) ×2 IMPLANT
GOWN STRL REUS W/ TWL LRG LVL3 (GOWN DISPOSABLE) ×4 IMPLANT
GOWN STRL REUS W/ TWL XL LVL3 (GOWN DISPOSABLE) ×2 IMPLANT
GOWN STRL REUS W/TWL LRG LVL3 (GOWN DISPOSABLE) ×2
GOWN STRL REUS W/TWL XL LVL3 (GOWN DISPOSABLE) ×1
GRAFT BONE CANNULA VIVIGEN 3 (Bone Implant) ×5 IMPLANT
IV CATH 14GX2 1/4 (CATHETERS) ×2 IMPLANT
KIT BASIN OR (CUSTOM PROCEDURE TRAY) ×2 IMPLANT
KIT POSITION SURG JACKSON T1 (MISCELLANEOUS) ×2 IMPLANT
KIT TURNOVER KIT B (KITS) ×2 IMPLANT
MARKER SKIN DUAL TIP RULER LAB (MISCELLANEOUS) ×4 IMPLANT
NDL 18GX1X1/2 (RX/OR ONLY) (NEEDLE) ×2 IMPLANT
NDL 22X1.5 STRL (OR ONLY) (MISCELLANEOUS) ×4 IMPLANT
NDL HYPO 25GX1X1/2 BEV (NEEDLE) ×2 IMPLANT
NDL SPNL 18GX3.5 QUINCKE PK (NEEDLE) ×4 IMPLANT
NEEDLE 18GX1X1/2 (RX/OR ONLY) (NEEDLE) ×1 IMPLANT
NEEDLE 22X1.5 STRL (OR ONLY) (MISCELLANEOUS) ×2 IMPLANT
NEEDLE HYPO 25GX1X1/2 BEV (NEEDLE) ×1 IMPLANT
NEEDLE SPNL 18GX3.5 QUINCKE PK (NEEDLE) ×2 IMPLANT
NS IRRIG 1000ML POUR BTL (IV SOLUTION) ×2 IMPLANT
OIL CARTRIDGE MAESTRO DRILL (MISCELLANEOUS) ×1
PACK LAMINECTOMY ORTHO (CUSTOM PROCEDURE TRAY) ×2 IMPLANT
PACK UNIVERSAL I (CUSTOM PROCEDURE TRAY) ×2 IMPLANT
PAD ARMBOARD 7.5X6 YLW CONV (MISCELLANEOUS) ×4 IMPLANT
PATTIES SURGICAL .5 X1 (DISPOSABLE) ×2 IMPLANT
PATTIES SURGICAL .5X1.5 (GAUZE/BANDAGES/DRESSINGS) ×2 IMPLANT
ROD EXPEDIUM PER BENT 65MM (Rod) IMPLANT
ROD SPINAL EXPEDIUM 5.5X70 (Rod) IMPLANT
SCREW CORTICAL VIPER 7X40MM (Screw) IMPLANT
SCREW SET SINGLE INNER (Screw) IMPLANT
SCREW VIPER CORT FIX 6.00X30 (Screw) IMPLANT
SCREW VIPER CORT FIX 6X35 (Screw) IMPLANT
SPONGE INTESTINAL PEANUT (DISPOSABLE) ×2 IMPLANT
SPONGE SURGIFOAM ABS GEL 100 (HEMOSTASIS) ×2 IMPLANT
SUT MNCRL AB 4-0 PS2 18 (SUTURE) ×2 IMPLANT
SUT VIC AB 0 CT1 18XCR BRD 8 (SUTURE) ×2 IMPLANT
SUT VIC AB 0 CT1 8-18 (SUTURE) ×1
SUT VIC AB 1 CT1 18XCR BRD 8 (SUTURE) ×2 IMPLANT
SUT VIC AB 1 CT1 8-18 (SUTURE) ×1
SUT VIC AB 2-0 CT2 18 VCP726D (SUTURE) ×2 IMPLANT
SYR 20ML LL LF (SYRINGE) ×4 IMPLANT
SYR BULB IRRIG 60ML STRL (SYRINGE) ×2 IMPLANT
SYR CONTROL 10ML LL (SYRINGE) ×4 IMPLANT
SYR TB 1ML LUER SLIP (SYRINGE) ×2 IMPLANT
TAP EXPEDIUM DL 5.0 (INSTRUMENTS) IMPLANT
TAP EXPEDIUM DL 6.0 (INSTRUMENTS) IMPLANT
TRAY FOLEY MTR SLVR 16FR STAT (SET/KITS/TRAYS/PACK) ×2 IMPLANT
WATER STERILE IRR 1000ML POUR (IV SOLUTION) ×2 IMPLANT
YANKAUER SUCT BULB TIP NO VENT (SUCTIONS) ×2 IMPLANT

## 2022-08-28 NOTE — Anesthesia Procedure Notes (Signed)
Procedure Name: Intubation Date/Time: 08/28/2022 7:51 AM  Performed by: Michele Rockers, CRNAPre-anesthesia Checklist: Patient identified, Patient being monitored, Timeout performed, Emergency Drugs available and Suction available Patient Re-evaluated:Patient Re-evaluated prior to induction Oxygen Delivery Method: Circle system utilized Preoxygenation: Pre-oxygenation with 100% oxygen Induction Type: IV induction Ventilation: Mask ventilation without difficulty Laryngoscope Size: Miller and 2 Grade View: Grade I Tube type: Oral Tube size: 7.0 mm Number of attempts: 1 Airway Equipment and Method: Stylet Placement Confirmation: ETT inserted through vocal cords under direct vision, positive ETCO2 and breath sounds checked- equal and bilateral Secured at: 21 cm Tube secured with: Tape Dental Injury: Teeth and Oropharynx as per pre-operative assessment

## 2022-08-28 NOTE — Anesthesia Postprocedure Evaluation (Signed)
Anesthesia Post Note  Patient: [name]  Procedure(s) Performed: LEFT-SIDED LUMBAR 3- LUMBAR 4, LUMBAR 4- LUMBAR 5 TRANSFORAMINAL LUMBAR INTERBODY FUSION AND DECOMPRESSION WITH INSTRUMENTATION AND ALLOGRAFT (Left)     Patient location during evaluation: PACU Anesthesia Type: General Level of consciousness: awake and alert Pain management: pain level controlled Vital Signs Assessment: post-procedure vital signs reviewed and stable Respiratory status: spontaneous breathing, nonlabored ventilation, respiratory function stable and patient connected to nasal cannula oxygen Cardiovascular status: blood pressure returned to baseline and stable Postop Assessment: no apparent nausea or vomiting Anesthetic complications: no   No notable events documented.  Last Vitals:  Vitals:   08/28/22 1345 08/28/22 1405  BP: 118/67 129/75  Pulse: 69 73  Resp: 10 18  Temp: 36.7 C   SpO2: 97% 97%    Last Pain:  Vitals:   08/28/22 1345  TempSrc:   PainSc: 0-No pain                 Barnet Glasgow

## 2022-08-28 NOTE — H&P (Signed)
PREOPERATIVE H&P  Chief Complaint: Left leg pain and weakness  HPI: [name] is a 66 y.o. female who presents with ongoing pain and weakness in the left leg  MRI reveals severe stenosis and instability at L3/4 and L4/5  Patient has failed multiple forms of conservative care and continues to have pain (see office notes for additional details regarding the patient's full course of treatment)  Past Medical History:  Diagnosis Date   Arthritis    Breast cancer ([place]) 01/15/2017   left breast   Cancer ([place])    GERD (gastroesophageal reflux disease)    HLD (hyperlipidemia)    Hypertension    Hypothyroidism    Obesity    Overactive bladder    Palpitation    Personal history of radiation therapy    Thyroid disease    Past Surgical History:  Procedure Laterality Date   bladder stem stretch  1966   BREAST LUMPECTOMY Left 02/16/2017   BREAST LUMPECTOMY WITH RADIOACTIVE SEED AND SENTINEL LYMPH NODE BIOPSY Left 02/16/2017   Procedure: BREAST LUMPECTOMY WITH RADIOACTIVE SEED AND AXILLARY SENTINEL LYMPH NODE BIOPSY;  Surgeon: Rolm Bookbinder, MD;  Location: [place];  Service: General;  Laterality: Left;   EVACUATION BREAST HEMATOMA Left 03/18/2017   Procedure: EVACUATION SEROMA LEFT BREAST;  Surgeon: Rolm Bookbinder, MD;  Location: [place];  Service: General;  Laterality: Left;   JOINT REPLACEMENT     KNEE ARTHROSCOPY     LEFT   NOSE SURGERY  1980   SHOULDER SURGERY Left 2007   [place] HIP ARTHROPLASTY Left 10/23/2015   Procedure: TOTAL HIP ARTHROPLASTY ANTERIOR APPROACH;  Surgeon: Melrose Nakayama, MD;  Location: [place];  Service: Orthopedics;  Laterality: Left;   Social History   Socioeconomic History   Marital status: Single    Spouse name: Not on file   Number of children: Not on file   Years of education: Not on file   Highest education level: Not on file  Occupational History   Not on file  Tobacco Use    Smoking status: Never   Smokeless tobacco: Never  Vaping Use   Vaping Use: Never used  Substance and Sexual Activity   Alcohol use: No   Drug use: No   Sexual activity: Not on file  Other Topics Concern   Not on file  Social History Narrative   Not on file   Social Determinants of Health   Financial Resource Strain: Not on file  Food Insecurity: Not on file  Transportation Needs: Not on file  Physical Activity: Not on file  Stress: Not on file  Social Connections: Not on file   Family History  Problem Relation Age of Onset   Heart Problems Father        cardiac arrest   Heart attack Mother    Heart disease Mother    Cancer Mother    Breast cancer Mother 78   Cancer Maternal Uncle 30       colon cancer    Cancer Maternal Grandmother        bladder cancer    No Known Allergies Prior to Admission medications   Medication Sig Start Date End Date Taking? Authorizing Provider  acetaminophen (TYLENOL) 500 MG tablet Take 500-1,000 mg by mouth every 6 (six) hours as needed for moderate pain.   Yes [provider]  clobetasol cream (TEMOVATE) 4.01 % APPLY 1 APPLICATION TOPICALLY 2 (TWO) TIMES DAILY  AS NEEDED (ITCHING). 05/26/22  Yes Hoyt Koch, MD  cyclobenzaprine (FLEXERIL) 5 MG tablet Take 5-10 mg by mouth at bedtime as needed for spasms. 08/04/22  Yes [provider]  diphenhydramine-acetaminophen (TYLENOL PM) 25-500 MG TABS tablet Take 1 tablet by mouth at bedtime as needed (sleep).   Yes [provider]  exemestane (AROMASIN) 25 MG tablet Take 1 tablet (25 mg total) by mouth daily after breakfast. 10/11/21  Yes Truitt Merle, MD  gabapentin (NEURONTIN) 100 MG capsule Take 1 capsule (100 mg total) by mouth 3 (three) times daily. Patient taking differently: Take 100-200 mg by mouth at bedtime. 12/17/21  Yes Benay Pike, MD  gabapentin (NEURONTIN) 300 MG capsule Take 300 mg by mouth at bedtime.   Yes [provider]   hydrochlorothiazide (HYDRODIURIL) 25 MG tablet TAKE 1 TABLET BY MOUTH EVERY DAY 09/04/21  Yes Hoyt Koch, MD  levothyroxine (SYNTHROID) 75 MCG tablet TAKE 1 TABLET BY MOUTH EVERY DAY 08/19/22  Yes Hoyt Koch, MD  methocarbamol (ROBAXIN) 500 MG tablet Take 500 mg by mouth every 6 (six) hours as needed for muscle spasms. 08/04/22  Yes [provider]  metoprolol succinate (TOPROL-XL) 25 MG 24 hr tablet TAKE 1 TABLET BY MOUTH EVERY DAY 09/04/21  Yes Hoyt Koch, MD  oxyCODONE-acetaminophen (PERCOCET) 5-325 MG tablet Take 1-2 tablets by mouth every 6 (six) hours as needed for severe pain. 07/04/22  Yes Molpus, John, MD  pantoprazole (PROTONIX) 40 MG tablet Take 1 tablet (40 mg total) by mouth daily. 05/12/22  Yes Hoyt Koch, MD  simvastatin (ZOCOR) 20 MG tablet TAKE 1 TABLET BY MOUTH EVERY DAY 09/04/21  Yes Hoyt Koch, MD  VITAMIN D PO Take 1 capsule by mouth daily.   Yes [provider]  predniSONE (DELTASONE) 20 MG tablet Take 2 tablets (40 mg total) by mouth daily with breakfast. Patient not taking: Reported on 08/13/2022 05/12/22   Hoyt Koch, MD     All other systems have been reviewed and were otherwise negative with the exception of those mentioned in the HPI and as above.  Physical Exam: Vitals:   08/28/22 0545  BP: (!) 140/91  Pulse: 85  Resp: 17  Temp: 99 F (37.2 C)  SpO2: 95%    Body mass index is 37.93 kg/m.  General: Alert, no acute distress Cardiovascular: No pedal edema Respiratory: No cyanosis, no use of accessory musculature Skin: No lesions in the area of chief complaint Neurologic: Sensation intact distally Psychiatric: Patient is competent for consent with normal mood and affect Lymphatic: No axillary or cervical lymphadenopathy   Assessment/Plan: Ongoing left leg pain and weakness due to severe stenosis at L3-4 and L4-5, in addition to instability. Plan for Procedure(s): LEFT-SIDED  LUMBAR 3- LUMBAR 4, LUMBAR 4- LUMBAR 5 TRANSFORAMINAL LUMBAR INTERBODY FUSION AND DECOMPRESSION WITH INSTRUMENTATION AND ALLOGRAFT   Norva Karvonen, MD 08/28/2022 6:41 AM

## 2022-08-28 NOTE — Transfer of Care (Signed)
Immediate Anesthesia Transfer of Care Note  Patient: [name]  Procedure(s) Performed: LEFT-SIDED LUMBAR 3- LUMBAR 4, LUMBAR 4- LUMBAR 5 TRANSFORAMINAL LUMBAR INTERBODY FUSION AND DECOMPRESSION WITH INSTRUMENTATION AND ALLOGRAFT (Left)  Patient Location: PACU  Anesthesia Type:General  Level of Consciousness: drowsy, patient cooperative and responds to stimulation  Airway & Oxygen Therapy: Patient Spontanous Breathing and Patient connected to face mask oxygen  Post-op Assessment: Report given to RN, Post -op Vital signs reviewed and stable and Patient moving all extremities X 4  Post vital signs: Reviewed and stable  Last Vitals:  Vitals Value Taken Time  BP 127/72 08/28/22 1216  Temp    Pulse 82 08/28/22 1218  Resp 16 08/28/22 1218  SpO2 99 % 08/28/22 1218  Vitals shown include unvalidated device data.  Last Pain:  Vitals:   08/28/22 0610  TempSrc:   PainSc: 0-No pain         Complications: No notable events documented.

## 2022-08-28 NOTE — Op Note (Signed)
PATIENT NAME: [name]   MEDICAL RECORD NO.:   [id number]    DATE OF BIRTH: Mar 06, 1956    DATE OF PROCEDURE: 08/28/2022                                 OPERATIVE REPORT     PREOPERATIVE DIAGNOSES: 1. Severe spinal stenosis L3-4, L4-5 (M48.062) 2. Left-sided lumbar radiculopathy (M54.16) 3. L3-4, L4-5 spondylolisthesis (M53.2X6)   POSTOPERATIVE DIAGNOSES: 1. Severe spinal stenosis L3-4, L4-5 (M48.062) 2. Left-sided lumbar radiculopathy (M54.16) 3. L3-4, L4-5 spondylolisthesis (M53.2X6)   PROCEDURES: 1. Lumbar decompression, L3-4, L4-5, including bilateral partial facetectomy, and bilateral lumbar decompression 2. Left-sided L3-4, L4-5 transforaminal lumbar interbody fusion. 3. Right-sided L3-4, L4-5 posterolateral fusion. 4. Insertion of interbody device x 2 (Globus expandable intervertebral spacers). 5. Placement of segmental posterior instrumentation L3, L4, L5, bilaterally. 6. Use of local autograft. 7. Use of morselized allograft - ViviGen. 8. Intraoperative use of fluoroscopy.   SURGEON:  Phylliss Bob, MD.   ASSISTANTPricilla Holm, PA-C.   ANESTHESIA:  General endotracheal anesthesia.   COMPLICATIONS:  None.   DISPOSITION:  Stable.   ESTIMATED BLOOD LOSS:  200cc   INDICATIONS FOR SURGERY:  Briefly, Ms. Eisenmenger is a pleasant 66 y.o. -year-old patient who did present to me with severe and ongoing pain in the left leg. I did feel that the symptoms were secondary to the findings noted above.   The patient failed conservative care and did wish to proceed with the procedure  noted above.    OPERATIVE DETAILS:  On 08/28/2022, the patient was brought to surgery and general endotracheal anesthesia was administered.  The patient was placed prone on a well-padded flat Jackson bed with a spinal frame.  Antibiotics were given and a time-out procedure was performed. The back was prepped and draped in the usual fashion.  A midline incision was made overlying the L3-4 and L4-5  intervertebral spaces.  The fascia was incised at the midline.  The paraspinal musculature was bluntly swept laterally.  Anatomic landmarks for the pedicles were exposed. Using fluoroscopy, I did cannulate the L3, L4, and L5 pedicles bilaterally, using a medial to lateral cortical trajectory technique.  On the right side, the posterolateral gutter and facet joints at L3-4 and L4-5 were decorticated and 6 mm screws of the appropriate length were placed at L3, L4, and L5 pedicles and a 65-mm rod was placed and distraction was applied across the rod at each intervertebral level.  On the left side, the cannulated pedicle holes were filled with bone wax.  I then proceeded with the decompressive aspect of the procedure.     Starting at L4-5, I did perform a bilateral partial facetectomy, and a full facetectomy on the left.  I was able to thoroughly and entirely decompress the L4-5 intervertebral space bilaterally, removing facet hypertrophy and ligamentum flavum hypertrophy.  At this point, with an assistant holding medial retraction of the traversing left L5 nerve, I did perform a thorough and complete L4-5 intervertebral discectomy.  The intervertebral space was then liberally packed with autograft from the decompression, as well as allograft in the form of ViviGen, as was the appropriately sized intervertebral spacer.  The spacer was then expanded to approximately 11 mm in height.  Is very pleased with the fit of the spacer. Distraction was then released on the contralateral right side.  I then turned my attention to the L3-4 level.  Once again, it was clearly evident that there was severe stenosis at the L3-4 level.  The stenosis was thoroughly and adequately decompressed by performing a bilateral partial facetectomy.  I was able to thoroughly decompress the L3-4 level on the right and left sides. With an assistant holding medial retraction of the traversing left L4 nerve, I did perform an annulotomy at  the posterolateral aspect of the L3-4 intervertebral space.  I then used a series of curettes and pituitary rongeurs to perform a thorough and complete intervertebral diskectomy.  The intervertebral space was then liberally packed with autograft as well as allograft in the form of ViviGen, as was the appropriate-sized intervertebral spacer.  The spacer was then tamped into position in the usual fashion, and expanded to approximately 10.4 mm in height.  I was very pleased with the press-fit of the spacer.  I then placed 6 mm screws on the left at L3, L4, and L5.  A 65-mm rod was then placed and caps were placed. The distraction was then released on the contralateral right side.  All 6 caps were then locked.  The wound was copiously irrigated with a total of approximately 3 L prior to placing the bone graft.  Additional autograft and allograft were then packed into the posterolateral gutter on the right side to help aid in the success of the fusion.  The wound was  explored for any undue bleeding and there was no substantial bleeding encountered.  Gel-Foam was placed over the laminectomy site.  The wound was then closed in layers using #1 Vicryl followed by 2-0 Vicryl, followed by 4-0 Monocryl.  Benzoin and Steri-Strips were applied followed by sterile dressing.       Of note, Pricilla Holm was my assistant throughout surgery, and did aid in retraction, suctioning, placement of the hardware, and closure.     Phylliss Bob, MD

## 2022-08-29 ENCOUNTER — Encounter (HOSPITAL_COMMUNITY): Payer: Self-pay | Admitting: Orthopedic Surgery

## 2022-08-29 DIAGNOSIS — M48062 Spinal stenosis, lumbar region with neurogenic claudication: Secondary | ICD-10-CM | POA: Diagnosis not present

## 2022-08-29 MED ORDER — METHOCARBAMOL 500 MG PO TABS: 500.0000 mg | ORAL_TABLET | Freq: Four times a day (QID) | ORAL | 2 refills | Status: DC | PRN

## 2022-08-29 MED ORDER — OXYCODONE-ACETAMINOPHEN 5-325 MG PO TABS: 1.0000 | ORAL_TABLET | ORAL | 0 refills | Status: DC | PRN

## 2022-08-29 MED FILL — Thrombin (Recombinant) For Soln 20000 Unit: CUTANEOUS | Qty: 1 | Status: AC

## 2022-08-29 NOTE — Progress Notes (Signed)
Patient alert and oriented, mae's well, voiding adequate amount of urine, swallowing without difficulty, no c/o pain at time of discharge. Patient discharged home with family. Script and discharged instructions given to patient. Patient and family stated understanding of instructions given. Patient has an appointment with Dr. Dumonski in 2 weeks 

## 2022-08-29 NOTE — Evaluation (Signed)
Occupational Therapy Evaluation Patient Details Name: [name] MRN: [id number] DOB: 06/02/1956 Today's Date: 08/29/2022   History of Present Illness 66 y.o. female presents to [facility] hospital for elective L3-5 TLIF on 08/28/2022. PMH includes OA, breast cancer, HTN, thyroid disease.   Clinical Impression   PTA, pt lived with her daughter and mother, and was independent, working, and driving. Upon eval, pt performing UB ADL at set-up level, and LB ADL with min A without AE, but reporting she has AE at home and does not prefer demo today. Pt performing functional mobility with supervision to min guard. Pt educated and demonstrating LB dressing, brace application, oral care, car transfer, shower transfer, toileting, toilet transfer and bed mobility within precautions during session. All questions answered, all education provided. Recommend discharge home with no OT follow up. Re-consult if change in status. OT to sign off.      Recommendations for follow up therapy are one component of a multi-disciplinary discharge planning process, led by the attending physician.  Recommendations may be updated based on patient status, additional functional criteria and insurance authorization.   Follow Up Recommendations  No OT follow up    Assistance Recommended at Discharge Intermittent Supervision/Assistance  Patient can return home with the following A little help with walking and/or transfers;A little help with bathing/dressing/bathroom;Assistance with cooking/housework;Assist for transportation;Help with stairs or ramp for entrance    Functional Status Assessment  Patient has had a recent decline in their functional status and demonstrates the ability to make significant improvements in function in a reasonable and predictable amount of time.  Equipment Recommendations  None recommended by OT (Pt has all recommended equipment)    Recommendations for Other Services       Precautions / Restrictions  Precautions Precautions: Fall;Back Precaution Booklet Issued: Yes (comment) Required Braces or Orthoses: Spinal Brace Spinal Brace: Thoracolumbosacral orthotic;Applied in sitting position Restrictions Weight Bearing Restrictions: No      Mobility Bed Mobility Overal bed mobility: Modified Independent Bed Mobility: Sit to Sidelying, Rolling Rolling: Modified independent (Device/Increase time)       Sit to sidelying: Min assist General bed mobility comments: Increased time, use of railing, min A to bring BLE into bed. Son present and asking meaningful questions regarding how to assist    Transfers Overall transfer level: Needs assistance Equipment used: None Transfers: Sit to/from Stand Sit to Stand: Supervision, From elevated surface           General transfer comment: Supervision for safety      Balance Overall balance assessment: Needs assistance Sitting-balance support: No upper extremity supported, Feet supported Sitting balance-Leahy Scale: Good     Standing balance support: No upper extremity supported, During functional activity Standing balance-Leahy Scale: Good                             ADL either performed or assessed with clinical judgement   ADL Overall ADL's : Needs assistance/impaired Eating/Feeding: Modified independent   Grooming: Oral care;Supervision/safety;Standing Grooming Details (indicate cue type and reason): within precautions during session Upper Body Bathing: Set up;Sitting   Lower Body Bathing: Sit to/from stand;Supervison/ safety   Upper Body Dressing : Set up;Sitting Upper Body Dressing Details (indicate cue type and reason): Intermittent min A for brace application to bring second strap under arm. Pt with good problem solving to use other straps for placement of under arm strap and no assist required on second attempt Lower Body Dressing: Minimal  assistance;Sit to/from stand Lower Body Dressing Details (indicate cue  type and reason): Min A to don underwear without reacher, but has one at home Toilet Transfer: Rolling walker (2 wheels);BSC/3in1;Ambulation;Supervision/safety Toilet Transfer Details (indicate cue type and reason): performing in session with BSC; has one at home [place] and Hygiene: Supervision/safety;Sit to/from stand   Tub/ Shower Transfer: Min guard;Ambulation;Walk-in shower Tub/Shower Transfer Details (indicate cue type and reason): Pt educated and demonstrating during session. Functional mobility during ADLs: Supervision/safety       Vision Baseline Vision/History: 1 Wears glasses Ability to See in Adequate Light: 0 Adequate Patient Visual Report: No change from baseline Vision Assessment?: No apparent visual deficits     Perception     Praxis      Pertinent Vitals/Pain Pain Assessment Pain Assessment: Faces Faces Pain Scale: Hurts little more Pain Location: low back Pain Descriptors / Indicators: Sore, Operative site guarding Pain Intervention(s): Limited activity within patient's tolerance, Monitored during session, Repositioned, Relaxation     Hand Dominance     Extremity/Trunk Assessment Upper Extremity Assessment Upper Extremity Assessment: Overall WFL for tasks assessed   Lower Extremity Assessment Lower Extremity Assessment: Generalized weakness   Cervical / Trunk Assessment Cervical / Trunk Assessment: Back Surgery   Communication Communication Communication: No difficulties   Cognition Arousal/Alertness: Awake/alert Behavior During Therapy: WFL for tasks assessed/performed Overall Cognitive Status: Within Functional Limits for tasks assessed                                 General Comments: Pt recalling all aspinal precautions and with self-application after initial education during session.     General Comments  VSS. Pt lives with mother and daughter. Daughter available to assist when not at work. Will not be  working and will be available 24/7 for the next several days. Pt reporting she has AE at home and did not prefer additional demonstration today    Exercises     Shoulder Instructions      Home Living Family/patient expects to be discharged to:: Private residence Living Arrangements: Parent;Children Available Help at Discharge: Family;Available PRN/intermittently Type of Home: House Home Access: Stairs to enter;Ramped entrance Entrance Stairs-Number of Steps: 6 Entrance Stairs-Rails: Can reach both Home Layout: One level     Bathroom Shower/Tub: Occupational psychologist: Standard     Home Equipment: Toilet riser;Cane - single point;Shower seat;Adaptive equipment;BSC/3in1 Adaptive Equipment: Reacher;Sock aid;Long-handled shoe horn        Prior Functioning/Environment Prior Level of Function : Independent/Modified Independent;Working/employed;Driving             Mobility Comments: works in Therapist, art ADLs Comments: Independent, working, and driving        OT Problem List: Decreased strength;Decreased activity tolerance;Impaired balance (sitting and/or standing);Decreased knowledge of precautions;Pain      OT Treatment/Interventions:      OT Goals(Current goals can be found in the care plan section) Acute Rehab OT Goals Patient Stated Goal: For sit<>stand to become easier and decrease SHOB experienced. OT Goal Formulation: With patient  OT Frequency:      Co-evaluation              AM-PAC OT "6 Clicks" Daily Activity     Outcome Measure Help from another person eating meals?: None Help from another person taking care of personal grooming?: A Little Help from another person toileting, which includes using toliet, bedpan, or urinal?: A Little  Help from another person bathing (including washing, rinsing, drying)?: A Little Help from another person to put on and taking off regular upper body clothing?: A Little Help from another person to put on  and taking off regular lower body clothing?: A Little 6 Click Score: 19   End of Session Equipment Utilized During Treatment: Gait belt;Back brace Nurse Communication: Mobility status  Activity Tolerance: Patient tolerated treatment well Patient left: in bed;with call bell/phone within reach;with family/visitor present  OT Visit Diagnosis: Unsteadiness on feet (R26.81);Muscle weakness (generalized) (M62.81);Pain Pain - part of body:  (back)                Time: 0810-0900 OT Time Calculation (min): 50 min Charges:  OT General Charges $OT Visit: 1 Visit OT Evaluation $OT Eval Low Complexity: 1 Low OT Treatments $Self Care/Home Management : 23-37 mins  Shanda Howells, OTR/L [facility] Acute Rehabilitation Office: [phone]  Lula Olszewski 08/29/2022, 11:24 AM

## 2022-08-29 NOTE — Progress Notes (Signed)
    Patient doing well PO day 1 with resolved L leg pain and burning. Mild residual numbness left great toe. LBP well controlled. Resting comfortably, good appetite, eating and drinking, +passing gas, NL B/B habits   Physical Exam: Vitals:   08/28/22 2345 08/29/22 0456  BP: (!) 144/74 118/70  Pulse: 94 92  Resp: 20 20  Temp: 99.3 F (37.4 C) 98.9 F (37.2 C)  SpO2: 95% 96%    Dressing in place, CDI, TLSO a bedside, pt resting comfortably in bed  NVI  POD #1 s/p 2 level TLIF, doing well. Resolved L leg pain, well controlled LBP  - up with PT/OT, encourage ambulation  -Can D/C once cleared by PT/OT - Percocet for pain, Robaxin for muscle spasms  -Scripts sent to pharmacy electronically  - likely d/c home today with f/u in 2 weeks

## 2022-08-31 ENCOUNTER — Other Ambulatory Visit: Payer: Self-pay | Admitting: Internal Medicine

## 2022-09-03 MED FILL — Sodium Chloride IV Soln 0.9%: INTRAVENOUS | Qty: 1000 | Status: AC

## 2022-09-03 MED FILL — Heparin Sodium (Porcine) Inj 1000 Unit/ML: INTRAMUSCULAR | Qty: 30 | Status: AC

## 2022-09-11 NOTE — Discharge Summary (Signed)
Patient ID: [name] MRN: [id number] DOB/AGE: 1956/02/23 66 y.o.  Admit date: 08/28/2022 Discharge date: 08/29/2022  Admission Diagnoses:  Principal Problem:   Radiculopathy   Discharge Diagnoses:  Same  Past Medical History:  Diagnosis Date   Arthritis    Breast cancer ([place]) 01/15/2017   left breast   Cancer ([place])    GERD (gastroesophageal reflux disease)    HLD (hyperlipidemia)    Hypertension    Hypothyroidism    Obesity    Overactive bladder    Palpitation    Personal history of radiation therapy    Thyroid disease     Surgeries: Procedure(s): LEFT-SIDED LUMBAR 3- LUMBAR 4, LUMBAR 4- LUMBAR 5 TRANSFORAMINAL LUMBAR INTERBODY FUSION AND DECOMPRESSION WITH INSTRUMENTATION AND ALLOGRAFT on 08/28/2022   Consultants: none  Discharged Condition: Improved  Hospital Course: [name] is an 66 y.o. female who was admitted 08/28/2022 for operative treatment of Radiculopathy. Patient has severe unremitting pain that affects sleep, daily activities, and work/hobbies. After pre-op clearance the patient was taken to the operating room on 08/28/2022 and underwent  Procedure(s): LEFT-SIDED LUMBAR 3- LUMBAR 4, LUMBAR 4- LUMBAR 5 TRANSFORAMINAL LUMBAR INTERBODY FUSION AND DECOMPRESSION WITH INSTRUMENTATION AND ALLOGRAFT.    Patient was given perioperative antibiotics:  Anti-infectives (From admission, onward)    Start     Dose/Rate Route Frequency Ordered Stop   08/28/22 1500  ceFAZolin (ANCEF) IVPB 2g/100 mL premix        2 g 200 mL/hr over 30 Minutes Intravenous Every 8 hours 08/28/22 1405 08/28/22 2345   08/28/22 0600  ceFAZolin (ANCEF) IVPB 2g/100 mL premix        2 g 200 mL/hr over 30 Minutes Intravenous On call to O.R. 08/28/22 0548 08/28/22 1145        Patient was given sequential compression devices, early ambulation to prevent DVT.  Patient benefited maximally from hospital stay and there were no complications.    Recent vital signs: BP 111/65 (BP Location: Left  Arm)   Pulse 93   Temp 98.5 F (36.9 C) (Oral)   Resp 16   Ht '5\' 6"'$  (1.676 m)   Wt 106.6 kg   SpO2 93%   BMI 37.93 kg/m    Discharge Medications:   Allergies as of 08/29/2022   No Known Allergies      Medication List     TAKE these medications    acetaminophen 500 MG tablet Commonly known as: TYLENOL Take 500-1,000 mg by mouth every 6 (six) hours as needed for moderate pain.   clobetasol cream 0.05 % Commonly known as: TEMOVATE APPLY 1 APPLICATION TOPICALLY 2 (TWO) TIMES DAILY AS NEEDED (ITCHING).   cyclobenzaprine 5 MG tablet Commonly known as: FLEXERIL Take 5-10 mg by mouth at bedtime as needed for spasms.   diphenhydramine-acetaminophen 25-500 MG Tabs tablet Commonly known as: TYLENOL PM Take 1 tablet by mouth at bedtime as needed (sleep).   exemestane 25 MG tablet Commonly known as: AROMASIN Take 1 tablet (25 mg total) by mouth daily after breakfast.   gabapentin 300 MG capsule Commonly known as: NEURONTIN Take 300 mg by mouth at bedtime. What changed: Another medication with the same name was changed. Make sure you understand how and when to take each.   gabapentin 100 MG capsule Commonly known as: NEURONTIN Take 1 capsule (100 mg total) by mouth 3 (three) times daily. What changed:  how much to take when to take this   levothyroxine 75 MCG tablet Commonly known  as: SYNTHROID TAKE 1 TABLET BY MOUTH EVERY DAY   methocarbamol 500 MG tablet Commonly known as: ROBAXIN Take 1 tablet (500 mg total) by mouth every 6 (six) hours as needed for muscle spasms.   oxyCODONE-acetaminophen 5-325 MG tablet Commonly known as: PERCOCET/ROXICET Take 1-2 tablets by mouth every 4 (four) hours as needed for severe pain. What changed: when to take this   pantoprazole 40 MG tablet Commonly known as: PROTONIX Take 1 tablet (40 mg total) by mouth daily.   predniSONE 20 MG tablet Commonly known as: DELTASONE Take 2 tablets (40 mg total) by mouth daily with  breakfast.   VITAMIN D PO Take 1 capsule by mouth daily.        Diagnostic Studies: DG Lumbar Spine 1 View  Result Date: 08/28/2022 CLINICAL DATA:  Peri op. EXAM: LUMBAR SPINE - 1 VIEW COMPARISON:  MRI lumbar spine May 15, 2022. FINDINGS: Single intraoperative cross-table radiograph. This demonstrates two needles projecting posteriorly at the lower L2 and L3-L4 levels. Multilevel degenerative change and stenosis better characterized on prior MRI of the lumbar spine. IMPRESSION: Localizing intraoperative radiograph, as above. Electronically Signed   By: Margaretha Sheffield M.D.   On: 08/28/2022 12:29   DG Lumbar Spine 2-3 Views  Result Date: 08/28/2022 CLINICAL DATA:  Left-sided L3-4 and L4-5 transforaminal lumbar interbody fusion and decompression with instrumentation and allograft. EXAM: LUMBAR SPINE - 2 VIEW COMPARISON:  MRI of lumbar spine 05/15/2022. FLUOROSCOPY: Exposure Index (as provided by the fluoroscopic device): 35.4 mGy Kerma FINDINGS: Pedicle screw and rod fixation is present at L3-4 and L4-5. Metallic disc spacers are present both levels. Hardware is intact. Disc space is improved at both levels. IMPRESSION: Status post lumbar fusion at L3-4 and L4-5. No radiographic evidence for complication. Electronically Signed   By: San Morelle M.D.   On: 08/28/2022 11:57   DG C-Arm 1-60 Min-No Report  Result Date: 08/28/2022 Fluoroscopy was utilized by the requesting physician.  No radiographic interpretation.   DG C-Arm 1-60 Min-No Report  Result Date: 08/28/2022 Fluoroscopy was utilized by the requesting physician.  No radiographic interpretation.   DG C-Arm 1-60 Min-No Report  Result Date: 08/28/2022 Fluoroscopy was utilized by the requesting physician.  No radiographic interpretation.   DG C-Arm 1-60 Min-No Report  Result Date: 08/28/2022 Fluoroscopy was utilized by the requesting physician.  No radiographic interpretation.    Disposition: Discharge disposition: 01-Home or  Self Care       Discharge Instructions     Discharge patient   Complete by: As directed    Discharge disposition: 01-Home or Self Care   Discharge patient date: 08/29/2022      POD #1 s/p 2 level TLIF, doing well. Resolved L leg pain, well controlled LBP   - up with PT/OT, encourage ambulation             -Can D/C once cleared by PT/OT - Percocet for pain, Robaxin for muscle spasms -Scripts for pain sent to pharmacy electronically  -D/C instructions sheet printed and in chart -D/C today  -F/U in office 2 weeks   Signed: Lennie Muckle Emile Ringgenberg 09/11/2022, 1:16 PM

## 2022-11-04 ENCOUNTER — Other Ambulatory Visit: Payer: Self-pay

## 2022-11-04 DIAGNOSIS — Z1231 Encounter for screening mammogram for malignant neoplasm of breast: Secondary | ICD-10-CM

## 2022-11-04 DIAGNOSIS — C50412 Malignant neoplasm of upper-outer quadrant of left female breast: Secondary | ICD-10-CM

## 2022-11-05 ENCOUNTER — Other Ambulatory Visit: Payer: Self-pay

## 2022-11-05 ENCOUNTER — Inpatient Hospital Stay (HOSPITAL_BASED_OUTPATIENT_CLINIC_OR_DEPARTMENT_OTHER): Payer: BC Managed Care – PPO | Admitting: Hematology

## 2022-11-05 ENCOUNTER — Encounter: Payer: Self-pay | Admitting: Hematology

## 2022-11-05 ENCOUNTER — Inpatient Hospital Stay: Payer: BC Managed Care – PPO | Attending: Hematology

## 2022-11-05 VITALS — BP 132/68 | HR 71 | Temp 98.1°F | Resp 18 | Ht 66.0 in | Wt 237.1 lb

## 2022-11-05 DIAGNOSIS — Z79899 Other long term (current) drug therapy: Secondary | ICD-10-CM | POA: Diagnosis not present

## 2022-11-05 DIAGNOSIS — Z170 Estrogen receptor positive status [ER+]: Secondary | ICD-10-CM | POA: Insufficient documentation

## 2022-11-05 DIAGNOSIS — Z79890 Hormone replacement therapy (postmenopausal): Secondary | ICD-10-CM | POA: Diagnosis not present

## 2022-11-05 DIAGNOSIS — M85859 Other specified disorders of bone density and structure, unspecified thigh: Secondary | ICD-10-CM | POA: Insufficient documentation

## 2022-11-05 DIAGNOSIS — E039 Hypothyroidism, unspecified: Secondary | ICD-10-CM | POA: Diagnosis not present

## 2022-11-05 DIAGNOSIS — M549 Dorsalgia, unspecified: Secondary | ICD-10-CM | POA: Insufficient documentation

## 2022-11-05 DIAGNOSIS — Z79811 Long term (current) use of aromatase inhibitors: Secondary | ICD-10-CM | POA: Diagnosis not present

## 2022-11-05 DIAGNOSIS — G8929 Other chronic pain: Secondary | ICD-10-CM | POA: Diagnosis not present

## 2022-11-05 DIAGNOSIS — E7800 Pure hypercholesterolemia, unspecified: Secondary | ICD-10-CM | POA: Insufficient documentation

## 2022-11-05 DIAGNOSIS — I10 Essential (primary) hypertension: Secondary | ICD-10-CM | POA: Diagnosis not present

## 2022-11-05 DIAGNOSIS — Z1231 Encounter for screening mammogram for malignant neoplasm of breast: Secondary | ICD-10-CM

## 2022-11-05 DIAGNOSIS — Z923 Personal history of irradiation: Secondary | ICD-10-CM | POA: Diagnosis not present

## 2022-11-05 DIAGNOSIS — E669 Obesity, unspecified: Secondary | ICD-10-CM | POA: Diagnosis not present

## 2022-11-05 DIAGNOSIS — C50412 Malignant neoplasm of upper-outer quadrant of left female breast: Secondary | ICD-10-CM | POA: Diagnosis present

## 2022-11-05 DIAGNOSIS — M1990 Unspecified osteoarthritis, unspecified site: Secondary | ICD-10-CM | POA: Diagnosis not present

## 2022-11-05 LAB — CBC WITH DIFFERENTIAL (CANCER CENTER ONLY)
Abs Immature Granulocytes: 0.03 10*3/uL (ref 0.00–0.07)
Basophils Absolute: 0.1 10*3/uL (ref 0.0–0.1)
Basophils Relative: 1 %
Eosinophils Absolute: 0.2 10*3/uL (ref 0.0–0.5)
Eosinophils Relative: 3 %
HCT: 39.7 % (ref 36.0–46.0)
Hemoglobin: 13.1 g/dL (ref 12.0–15.0)
Immature Granulocytes: 1 %
Lymphocytes Relative: 26 %
Lymphs Abs: 1.7 10*3/uL (ref 0.7–4.0)
MCH: 29.1 pg (ref 26.0–34.0)
MCHC: 33 g/dL (ref 30.0–36.0)
MCV: 88.2 fL (ref 80.0–100.0)
Monocytes Absolute: 0.7 10*3/uL (ref 0.1–1.0)
Monocytes Relative: 10 %
Neutro Abs: 3.9 10*3/uL (ref 1.7–7.7)
Neutrophils Relative %: 59 %
Platelet Count: 349 10*3/uL (ref 150–400)
RBC: 4.5 MIL/uL (ref 3.87–5.11)
RDW: 13.2 % (ref 11.5–15.5)
WBC Count: 6.6 10*3/uL (ref 4.0–10.5)
nRBC: 0 % (ref 0.0–0.2)

## 2022-11-05 LAB — CMP (CANCER CENTER ONLY)
ALT: 52 U/L — ABNORMAL HIGH (ref 0–44)
AST: 40 U/L (ref 15–41)
Albumin: 3.9 g/dL (ref 3.5–5.0)
Alkaline Phosphatase: 109 U/L (ref 38–126)
Anion gap: 8 (ref 5–15)
BUN: 15 mg/dL (ref 8–23)
CO2: 26 mmol/L (ref 22–32)
Calcium: 9.7 mg/dL (ref 8.9–10.3)
Chloride: 106 mmol/L (ref 98–111)
Creatinine: 0.91 mg/dL (ref 0.44–1.00)
GFR, Estimated: >60 mL/min (ref >60)
Glucose, Bld: 106 mg/dL — ABNORMAL HIGH (ref 70–99)
Potassium: 4.1 mmol/L (ref 3.5–5.1)
Sodium: 140 mmol/L (ref 135–145)
Total Bilirubin: 0.6 mg/dL (ref 0.3–1.2)
Total Protein: 8.3 g/dL — ABNORMAL HIGH (ref 6.5–8.1)

## 2022-11-05 LAB — SAMPLE TO BLOOD BANK

## 2022-11-05 MED ORDER — EXEMESTANE 25 MG PO TABS: 25.0000 mg | ORAL_TABLET | Freq: Every day | ORAL | 3 refills | Status: DC

## 2022-11-05 NOTE — Progress Notes (Signed)
[facility]   [phone] [phone]) [phone]   Clinic Follow up Note   Patient Care Team: Hoyt Koch, MD as PCP - General (Internal Medicine) Delice Bison, Charlestine Massed, NP as Nurse Practitioner (Hematology and Oncology) Truitt Merle, MD as Consulting Physician (Hematology) Kyung Rudd, MD as Consulting Physician (Radiation Oncology) Rolm Bookbinder, MD as Consulting Physician (General Surgery)  Date of Service:  11/05/2022  CHIEF COMPLAINT: f/u of left breast cancer  CURRENT THERAPY:  Antiestrogen therapy, starting 06/2017. Currently Exemestane since 11/2017.  ASSESSMENT & PLAN:  [name] is a 66 y.o. post-menopausal female with   1. Malignant neoplasm of upper-outer quadrant of left breast, invasive lobular carcinoma, grade 1, pT2N0M0, ER+/PR+/HER2-, Oncotype RS low risk -diagnosed in 12/2016. S/p left breast lumpectomy 02/16/17, path showed 2.1+ cm ILC and LCIS. -Oncotype RS of 12, low risk. -She started adjuvant letrozole in 06/2017. Due to joint pain and headaches, she switched to exemestane in 11/2017. She is tolerating very well with no noticeable side effects. -most recent MM 04/01/22 was negative. -She is clinically doing well except recent back surgery, which she is slowly healing from. Lab reviewed, overall WNL. Her physical exam showed some left breast tenderness and mild swelling, otherwise unremarkable. There is no high clinical concern for recurrence. -Continue Surveillance. Next mammogram in 03/2023; I ordered today. -Continue exemestane, plan for 7 years. -F/u in 6 months     2. Osteopenia  -baseline DEXA on 07/29/17 showed osteopenia in femur neck with a T-Score of -1.1.  -her bone density has been worsening since being on AI, last DEXA on 08/06/22 showed a drop to -1.7. -she is not currently taking calcium or vit D. I encouraged her to restart.  3. Chronic Back Pain, Arthritis -s/p L3-5 fusion/decompression 08/28/22 by Dr.  Lynann Bologna -she is slowly recovering   4. Comorbiditied: HTN, hypercholesterolemia, Hypothyroidism, obesity  -on HCTZ, levothyroxine per PCP -her weight has fluctuated (baseline was 220 lbs at diagnosis, lowest was 185 in 07/2019), but she is now up to 237 lbs today (11/05/22). Exemestane has likely played a role in this, as well as recent back surgery. I offered referral to our weight management clinic; she is interested but would like to continue working on her back before proceeding with this. I encouraged her to reach out if or when she is ready for referral.     Plan: -Continue Exemestane -mammogram 03/2023 -Lab and f/u in 6 months -she will call me when she is ready to be referred to weight management clinic    No problem-specific Assessment & Plan notes found for this encounter.   SUMMARY OF ONCOLOGIC HISTORY: Oncology History Overview Note  Cancer Staging Malignant neoplasm of upper-outer quadrant of left breast in female, estrogen receptor positive ([place]) Staging form: Breast, AJCC 8th Edition - Clinical stage from 01/15/2017: Stage IA (cT1c, cN0, cM0, G1, ER: Positive, PR: Positive, HER2: Negative) - Signed by Truitt Merle, MD on 01/27/2017    Malignant neoplasm of upper-outer quadrant of left breast in female, estrogen receptor positive ([place])  01/15/2017 Initial Biopsy   Left breast 2:00 core needle biopsy showed invasive lobular carcinoma, atypical lobular hyperplasia. Grade 1.   01/15/2017 Receptors her2   ER 90% positive, PR 90% positive, HER-2 negative, Ki-67 1%   01/15/2017 Mammogram   Bilateral diagnostic mammogram and ultrasound showed irregular hypoechoic mass in the left breast 2:00 position, 9 cm from the nipple, measuring 1.2 x 1.0 x 0.8 cm, no evidence of malignancy within the  right breast, ultrasound of the left axilla was negative. Breast density category B   01/15/2017 Initial Diagnosis   Malignant neoplasm of upper-outer quadrant of left breast in female, estrogen  receptor positive ([place])   02/16/2017 Pathology Results   Diagnosis 1. Breast, lumpectomy, Left - INVASIVE GRADE I LOBULAR CARCINOMA. - TUMOR IS ESTIMATED TO SPAN AT LEAST 2.1 CM IN GREATEST DIMENSION. 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 - INVASIVE TUMOR IS FOCALLY LESS THAN 0.1 CM TO LATERAL MARGIN BUT INKED MARGINAL SURFACE IS NEGATIVE FOR TUMOR. - INITIAL ANTERIOR MARGIN CANNOT BE EFFECTIVELY CLEARED OF INVASIVE TUMOR, PLEASE SEE SPECIMEN #10 FOR FINAL MARGIN STATUS. - SEE ONCOLOGY TEMPLATE. 2. 4 left axillary sentinel lymph nodes were negative 3. Additional resection of left posterior, superior, anterior and inferior margins were benign.    02/16/2017 Surgery   breast lumpectomy with radioactive seed and axillary sentinel lymph node biposy with Dr. Donne Hazel   02/16/2017 Oncotype testing   Testing resulted in reccurance score of 12.   02/16/2017 Pathology Results   Left breast lumpectomy showed a 2.1 cm invasive grade 1 lobular carcinoma, associated LCIS and atypical lobular hyperplasia. Surgical margins were negative. 4 sentinel lymph nodes were negative.   02/16/2017 Oncotype testing   RS 12, low risk, predicts 10 year risk of distant recurrence 8% with tamoxifen, adjuvant chemotherapy was not recommended.   03/09/2017 Imaging    IMPRESSION: Large complex fluid collection extending throughout the upper left breast, 3:00 to 9:00 axes, at posterior depth, presumed hematoma, possibly multiloculated.   03/18/2017 Surgery    Evacuation seroma left bresat with Dr. Donne Hazel    04/21/2017 - 06/08/2017 Radiation Therapy   Patient received radiation treatment with Dr. Lisbeth Renshaw   06/2017 -  Anti-estrogen oral therapy   Letrozole daily. Switch to Exemestane in 11/2017 due to joint pain.    09/03/2017 Survivorship   Survivorship Clinic with NP    01/19/2018 Mammogram   IMPRESSION: No mammographic evidence of breast malignancy.      INTERVAL  HISTORY:  [name] is here for a follow up of breast cancer. She was last seen by me on 05/05/22. She presents to the clinic alone. She tells me she is slowly recovering from her recent back surgery. She explains it has been a difficult process, and she has not yet started PT. She denies any other new pains or concerns. She reports stable muscle spasms to her left breast/chest wall.   All other systems were reviewed with the patient and are negative.  MEDICAL HISTORY:  Past Medical History:  Diagnosis Date   Arthritis    Breast cancer ([facility]) 01/15/2017   left breast   Cancer ([facility])    GERD (gastroesophageal reflux disease)    HLD (hyperlipidemia)    Hypertension    Hypothyroidism    Obesity    Overactive bladder    Palpitation    Personal history of radiation therapy    Thyroid disease     SURGICAL HISTORY: Past Surgical History:  Procedure Laterality Date   bladder stem stretch  1966   BREAST LUMPECTOMY Left 02/16/2017   BREAST LUMPECTOMY WITH RADIOACTIVE SEED AND SENTINEL LYMPH NODE BIOPSY Left 02/16/2017   Procedure: BREAST LUMPECTOMY WITH RADIOACTIVE SEED AND AXILLARY SENTINEL LYMPH NODE BIOPSY;  Surgeon: Rolm Bookbinder, MD;  Location: [facility];  Service: General;  Laterality: Left;   EVACUATION BREAST HEMATOMA Left 03/18/2017   Procedure: EVACUATION SEROMA LEFT BREAST;  Surgeon: Rolm Bookbinder, MD;  Location: [facility];  Service: General;  Laterality: Left;   JOINT REPLACEMENT     KNEE ARTHROSCOPY     LEFT   NOSE SURGERY  1980   SHOULDER SURGERY Left 2007   TONSILLECTOMY  1964   TOTAL HIP ARTHROPLASTY Left 10/23/2015   Procedure: TOTAL HIP ARTHROPLASTY ANTERIOR APPROACH;  Surgeon: Melrose Nakayama, MD;  Location: [facility];  Service: Orthopedics;  Laterality: Left;   TRANSFORAMINAL LUMBAR INTERBODY FUSION (TLIF) WITH PEDICLE SCREW FIXATION 2 LEVEL Left 08/28/2022   Procedure: LEFT-SIDED LUMBAR 3- LUMBAR 4, LUMBAR 4- LUMBAR 5 TRANSFORAMINAL  LUMBAR INTERBODY FUSION AND DECOMPRESSION WITH INSTRUMENTATION AND ALLOGRAFT;  Surgeon: Phylliss Bob, MD;  Location: [facility];  Service: Orthopedics;  Laterality: Left;    I have reviewed the social history and family history with the patient and they are unchanged from previous note.  ALLERGIES:  has No Known Allergies.  MEDICATIONS:  Current Outpatient Medications  Medication Sig Dispense Refill   acetaminophen (TYLENOL) 500 MG tablet Take 500-1,000 mg by mouth every 6 (six) hours as needed for moderate pain.     clobetasol cream (TEMOVATE) 1.28 % APPLY 1 APPLICATION TOPICALLY 2 (TWO) TIMES DAILY AS NEEDED (ITCHING). 30 g 6   cyclobenzaprine (FLEXERIL) 5 MG tablet Take 5-10 mg by mouth at bedtime as needed for spasms.     diphenhydramine-acetaminophen (TYLENOL PM) 25-500 MG TABS tablet Take 1 tablet by mouth at bedtime as needed (sleep).     exemestane (AROMASIN) 25 MG tablet Take 1 tablet (25 mg total) by mouth daily after breakfast. 90 tablet 3   gabapentin (NEURONTIN) 100 MG capsule Take 1 capsule (100 mg total) by mouth 3 (three) times daily. (Patient taking differently: Take 100-200 mg by mouth at bedtime.) 90 capsule 1   gabapentin (NEURONTIN) 300 MG capsule Take 300 mg by mouth at bedtime.     hydrochlorothiazide (HYDRODIURIL) 25 MG tablet TAKE 1 TABLET BY MOUTH EVERY DAY 90 tablet 0   levothyroxine (SYNTHROID) 75 MCG tablet TAKE 1 TABLET BY MOUTH EVERY DAY 90 tablet 3   methocarbamol (ROBAXIN) 500 MG tablet Take 1 tablet (500 mg total) by mouth every 6 (six) hours as needed for muscle spasms. 30 tablet 2   metoprolol succinate (TOPROL-XL) 25 MG 24 hr tablet TAKE 1 TABLET BY MOUTH EVERY DAY 90 tablet 0   oxyCODONE-acetaminophen (PERCOCET/ROXICET) 5-325 MG tablet Take 1-2 tablets by mouth every 4 (four) hours as needed for severe pain. 30 tablet 0   pantoprazole (PROTONIX) 40 MG tablet Take 1 tablet (40 mg total) by mouth daily. 90 tablet 3   predniSONE (DELTASONE) 20 MG tablet Take 2  tablets (40 mg total) by mouth daily with breakfast. (Patient not taking: Reported on 08/13/2022) 10 tablet 0   simvastatin (ZOCOR) 20 MG tablet TAKE 1 TABLET BY MOUTH EVERY DAY 90 tablet 0   VITAMIN D PO Take 1 capsule by mouth daily.     No current facility-administered medications for this visit.    PHYSICAL EXAMINATION: ECOG PERFORMANCE STATUS: 1 - Symptomatic but completely ambulatory  Vitals:   11/05/22 0828  BP: 132/68  Pulse: 71  Resp: 18  Temp: 98.1 F (36.7 C)  SpO2: 100%   Wt Readings from Last 3 Encounters:  11/05/22 237 lb 1.6 oz (107.5 kg)  08/28/22 235 lb (106.6 kg)  08/19/22 235 lb 3.2 oz (106.7 kg)     GENERAL:alert, no distress and comfortable SKIN: skin color, texture, turgor are normal, no rashes or significant lesions  EYES: normal, Conjunctiva are pink and non-injected, sclera clear  NECK: supple, thyroid normal size, non-tender, without nodularity LYMPH:  no palpable lymphadenopathy in the cervical, axillary LUNGS: clear to auscultation and percussion with normal breathing effort HEART: regular rate & rhythm and no murmurs and no lower extremity edema ABDOMEN:abdomen soft, non-tender and normal bowel sounds Musculoskeletal:no cyanosis of digits and no clubbing  NEURO: alert & oriented x 3 with fluent speech, no focal motor/sensory deficits BREAST: (+) tenderness to right breast, (+) mild swelling. No palpable mass, nodules or adenopathy bilaterally. Breast exam benign.   LABORATORY DATA:  I have reviewed the data as listed    Latest Ref Rng & Units 11/05/2022    7:56 AM 08/19/2022    8:15 AM 05/05/2022    1:31 PM  CBC  WBC 4.0 - 10.5 K/uL 6.6  6.1  7.1   Hemoglobin 12.0 - 15.0 g/dL 13.1  13.9  13.5   Hematocrit 36.0 - 46.0 % 39.7  42.0  39.3   Platelets 150 - 400 K/uL 349  349  327         Latest Ref Rng & Units 11/05/2022    7:56 AM 08/19/2022    8:15 AM 05/05/2022    1:31 PM  CMP  Glucose 70 - 99 mg/dL 106  109  140   BUN 8 - 23 mg/dL _0 Creatinine 0.44 - 1.00 mg/dL 0.91  1.00  0.99   Sodium 135 - 145 mmol/L 140  138  139   Potassium 3.5 - 5.1 mmol/L 4.1  4.1  3.7   Chloride 98 - 111 mmol/L 106  103  103   CO2 22 - 32 mmol/L _1 Calcium 8.9 - 10.3 mg/dL 9.7  9.6  9.8   Total Protein 6.5 - 8.1 g/dL 8.3   7.5   Total Bilirubin 0.3 - 1.2 mg/dL 0.6   0.7   Alkaline Phos 38 - 126 U/L 109   97   AST 15 - 41 U/L 40   19   ALT 0 - 44 U/L 52   18       RADIOGRAPHIC STUDIES: I have personally reviewed the radiological images as listed and agreed with the findings in the report. No results found.    Orders Placed This Encounter  Procedures   MM Digital Screening    Standing Status:   Future    Standing Expiration Date:   11/05/2023    Order Specific Question:   Reason for Exam (SYMPTOM  OR DIAGNOSIS REQUIRED)    Answer:   screening    Order Specific Question:   Preferred imaging location?    Answer:   [facility]   All questions were answered. The patient knows to call the clinic with any problems, questions or concerns. No barriers to learning was detected. The total time spent in the appointment was 30 minutes.     Truitt Merle, MD 11/05/2022   I, Wilburn Mylar, am acting as scribe for Truitt Merle, MD.   I have reviewed the above documentation for accuracy and completeness, and I agree with the above.

## 2022-12-08 ENCOUNTER — Other Ambulatory Visit: Payer: Self-pay | Admitting: Internal Medicine

## 2023-01-05 ENCOUNTER — Telehealth: Payer: Self-pay | Admitting: *Deleted

## 2023-01-05 DIAGNOSIS — Z170 Estrogen receptor positive status [ER+]: Secondary | ICD-10-CM

## 2023-01-05 DIAGNOSIS — C50412 Malignant neoplasm of upper-outer quadrant of left female breast: Secondary | ICD-10-CM

## 2023-01-05 NOTE — Telephone Encounter (Signed)
Referral sent.Pt notified of phone number to Nutrition Clinic

## 2023-01-05 NOTE — Telephone Encounter (Signed)
[name] states she is ready to start the weight mgmt program that Dr Burr Medico mentioned in November

## 2023-02-02 ENCOUNTER — Encounter: Payer: Self-pay | Admitting: Skilled Nursing Facility1

## 2023-02-02 ENCOUNTER — Other Ambulatory Visit: Payer: Self-pay | Admitting: Hematology

## 2023-02-02 ENCOUNTER — Encounter: Payer: BC Managed Care – PPO | Attending: Hematology | Admitting: Skilled Nursing Facility1

## 2023-02-02 VITALS — Ht 66.0 in | Wt 235.9 lb

## 2023-02-02 DIAGNOSIS — E669 Obesity, unspecified: Secondary | ICD-10-CM | POA: Insufficient documentation

## 2023-02-02 NOTE — Progress Notes (Unsigned)
Medical Nutrition Therapy   Primary concerns today: to lose weight  Referral diagnosis: weight loss   NUTRITION ASSESSMENT   Clinical Medical Hx: Breast Cancer 2018, GERD, hypercholesterolemia, thyroid, overactive bladder Medications: see list Labs: total protein 8.3, Alt 52 Notable Signs/Symptoms: Acid Reflux   Lifestyle & Dietary Hx  Pt states her acid reflux is terrible especially if she does not take her medicine.  Pt states her mom moved in with her about 5 years ago and has been very stressful living with her stating also her mom sleeps in the same bed as her. Pt states her son is a professor at [employer]. Pt states she feels she cannot do anything without her mom as she is her caregiver. Pt states her 22 year daughter lives with them as well. Pt states she does emotionally eat nightly.   Working on relationship with food.   Estimated daily fluid intake: 70 oz Supplements:  Sleep: tylenol pm nightly  Stress / self-care: high stress Current average weekly physical activity: ADL's  24-Hr Dietary Recall First Meal: sugar free chocolate pudding or yogurt Snack:  Second Meal: salad: sunflower seeds, dried cranberries, croutons, honey mustard or ranch+ hilshire [place] slices + bag lettuce Snack: no sugar applesauce or fruit cup Third Meal: eaten out Snack: snaking: chips. 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 Get a separate bed Complete the mindful meals sheet Add in more non starchy vegetables to your salad  Split your eating out meal   MONITORING & EVALUATION Dietary intake, weekly physical activity  Next Steps  Patient is to follow up in 1 month.

## 2023-02-25 ENCOUNTER — Encounter: Payer: Self-pay | Admitting: Skilled Nursing Facility1

## 2023-02-25 ENCOUNTER — Encounter: Payer: BC Managed Care – PPO | Attending: Hematology | Admitting: Skilled Nursing Facility1

## 2023-02-25 VITALS — Ht 66.0 in | Wt 226.8 lb

## 2023-02-25 DIAGNOSIS — Z713 Dietary counseling and surveillance: Secondary | ICD-10-CM | POA: Diagnosis not present

## 2023-02-25 DIAGNOSIS — Z6830 Body mass index (BMI) 30.0-30.9, adult: Secondary | ICD-10-CM | POA: Insufficient documentation

## 2023-02-25 DIAGNOSIS — K219 Gastro-esophageal reflux disease without esophagitis: Secondary | ICD-10-CM | POA: Insufficient documentation

## 2023-02-25 DIAGNOSIS — E669 Obesity, unspecified: Secondary | ICD-10-CM | POA: Insufficient documentation

## 2023-02-25 NOTE — Progress Notes (Signed)
Medical Nutrition Therapy   Primary concerns today: to lose weight  Referral diagnosis: weight loss   NUTRITION ASSESSMENT   Clinical Medical Hx: Breast Cancer 2018, GERD, hypercholesterolemia, thyroid, overactive bladder Medications: see list Labs: total protein 8.3, Alt 52 Notable Signs/Symptoms: Acid Reflux   Lifestyle & Dietary Hx  Pt is actively Working on her relationship with food which is going very well.  Pt states she been paying attention to what she has be eating at night and reducing her snacking still eating her peanuts. Pt states she will choose pickles or an apple for a snack instead of chips and cookies.  Pt states she walks a mile every day at lunch.  Pt states she walks away from her mom when she is stressing her. Pt state sone of her brothers did take her mom for lunch.  Pt states when she goes out to eat she gets a to go box before she even starts eating. Pt states she does go to her daughters work Building surveyor.  Pt states she is still looking for a single bed she can afford to get out of the same bed as her mother.  Pt states when she was feeling aggravated she drinks a lot of water or coffee. Pt states she has a fried that is really encouraging for her.  Pt states she is making mindful choices with her foods.   Pt state she is feeling fantastic about everything and is excited about ehr journey. Pt states she does not need any further appts as she feels she has it more figured out now but will reach out if she needs.    Estimated daily fluid intake: 70 oz Supplements:  Sleep: tylenol pm nightly  Stress / self-care: high stress Current average weekly physical activity: Walking daily   24-Hr Dietary Recall First Meal 9:30: berries Snack:  Second Meal 1pm: salad: sunflower seeds, dried cranberries, croutons, broccoli, cauliflower, carrots, egg honey mustard or ranch+ hil[place] slices + bag lettuce and no sugar added applesauce  Snack: sometimes  fruit Third Meal: half sir pizza chef salad (only a little dressing) Snack: apple and pickles spears  Beverages: (2-3 cups) coffee + sugar free creamer, water   NUTRITION INTERVENTION  Nutrition education (E-1) on the following topics: Continued Emotional eating and working on relationship with food Stress management   Handouts Previous;y Provided Include  Meal ideas sheet Mindful meals   Learning Style & Readiness for Change Teaching method utilized: Visual & Auditory  Demonstrated degree of understanding via: Teach Back  Barriers to learning/adherence to lifestyle change: living with her mother  Goals Established by Pt Continue: Ask siblings for help with mom Continue: Get to the gym: 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 Continue: Get a separate bed Continue: Complete the mindful meals sheet Continue: Add in more non starchy vegetables to your salad  Continue: Split your eating out meal   MONITORING & EVALUATION Dietary intake, weekly physical activity  Next Steps  Patient is to follow up: please call or email with any future questions or concerns

## 2023-03-04 ENCOUNTER — Other Ambulatory Visit: Payer: Self-pay | Admitting: Internal Medicine

## 2023-04-03 ENCOUNTER — Ambulatory Visit
Admission: RE | Admit: 2023-04-03 | Discharge: 2023-04-03 | Disposition: A | Discharge status: Home / Self Care | Payer: BC Managed Care – PPO | Source: Ambulatory Visit | Attending: Hematology | Admitting: Hematology

## 2023-04-03 DIAGNOSIS — Z1231 Encounter for screening mammogram for malignant neoplasm of breast: Secondary | ICD-10-CM

## 2023-05-05 NOTE — Assessment & Plan Note (Signed)
invasive lobular carcinoma, grade 1, pT2N0M0, ER+/PR+/HER2-, Oncotype RS low risk -diagnosed in 12/2016. S/p left breast lumpectomy 02/16/17, path showed 2.1+ cm ILC and LCIS. -Oncotype RS of 12, low risk. -She started adjuvant letrozole in 06/2017. Due to joint pain and headaches, she switched to exemestane in 11/2017. She is tolerating very well with no noticeable side effects. -most recent MM 04/01/22 was negative. -She is clinically doing well except recent back surgery, which she is slowly healing from. Lab reviewed, overall WNL. Her physical exam showed some left breast tenderness and mild swelling, otherwise unremarkable. There is no high clinical concern for recurrence. -Continue Surveillance. Next mammogram in 03/2023; I ordered today. -Continue exemestane, plan for 7 years. -F/u in 6 months

## 2023-05-05 NOTE — Progress Notes (Unsigned)
[facility]   [phone] [phone]   Clinic Follow up Note   Patient Care Team: Myrlene Broker, MD as PCP - General (Internal Medicine) Axel Filler, Larna Daughters, NP as Nurse Practitioner (Hematology and Oncology) Malachy Mood, MD as Consulting Physician (Hematology) Dorothy Puffer, MD as Consulting Physician (Radiation Oncology) Emelia Loron, MD as Consulting Physician (General Surgery)  Date of Service:  05/06/2023  CHIEF COMPLAINT: f/u of left breast cancer    CURRENT THERAPY:  Antiestrogen therapy, starting 06/2017. Currently Exemestane since 11/2017.  ASSESSMENT:  [name] is a 67 y.o. female with   Malignant neoplasm of upper-outer quadrant of left breast in female, estrogen receptor positive (HCC)  invasive lobular carcinoma, grade 1, pT2N0M0, ER+/PR+/HER2-, Oncotype RS low risk -diagnosed in 12/2016. S/p left breast lumpectomy 02/16/17, path showed 2.1+ cm ILC and LCIS. -Oncotype RS of 12, low risk. -She started adjuvant letrozole in 06/2017. Due to joint pain and headaches, she switched to exemestane in 11/2017. She is tolerating very well with no noticeable side effects. -most recent MM 04/01/22 was negative. -She is clinically doing well except recent back surgery, which she is slowly healing from. Lab reviewed, overall WNL. Her physical exam was unremarkable. There is no clinical concern for recurrence. -Continue Surveillance. Next mammogram in 03/2023; I ordered today. -Continue exemestane, plan for 7 years. -F/u in 6 months    Osteopenia -Bone density scan in August 2023 showed osteopenia, with T-score -1.7 at femur neck.  No high risk for fracture -He is on vitamin D, I encouraged her to add on calcium supplement -I encouraged her to do weightbearing exercise   PLAN: -lab reviewed -CMP -pending -recommend OTC calcium supplement -Continue Exemestane til next year -order Mammogram in 1 year - lab and f/u in 1  year   SUMMARY OF ONCOLOGIC HISTORY: Oncology History Overview Note  Cancer Staging Malignant neoplasm of upper-outer quadrant of left breast in female, estrogen receptor positive (HCC) Staging form: Breast, AJCC 8th Edition - Clinical stage from 01/15/2017: Stage IA (cT1c, cN0, cM0, G1, ER: Positive, PR: Positive, HER2: Negative) - Signed by Malachy Mood, MD on 01/27/2017    Malignant neoplasm of upper-outer quadrant of left breast in female, estrogen receptor positive (HCC)  01/15/2017 Initial Biopsy   Left breast 2:00 core needle biopsy showed invasive lobular carcinoma, atypical lobular hyperplasia. Grade 1.   01/15/2017 Receptors her2   ER 90% positive, PR 90% positive, HER-2 negative, Ki-67 1%   01/15/2017 Mammogram   Bilateral diagnostic mammogram and ultrasound showed irregular hypoechoic mass in the left breast 2:00 position, 9 cm from the nipple, measuring 1.2 x 1.0 x 0.8 cm, no evidence of malignancy within the right breast, ultrasound of the left axilla was negative. Breast density category B   01/15/2017 Initial Diagnosis   Malignant neoplasm of upper-outer quadrant of left breast in female, estrogen receptor positive (HCC)   02/16/2017 Pathology Results   Diagnosis 1. Breast, lumpectomy, Left - INVASIVE GRADE I LOBULAR CARCINOMA. - TUMOR IS ESTIMATED TO SPAN AT LEAST 2.1 CM IN GREATEST DIMENSION. 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 - INVASIVE TUMOR IS FOCALLY LESS THAN 0.1 CM TO LATERAL MARGIN BUT INKED MARGINAL SURFACE IS NEGATIVE FOR TUMOR. - INITIAL ANTERIOR MARGIN CANNOT BE EFFECTIVELY CLEARED OF INVASIVE TUMOR, PLEASE SEE SPECIMEN #10 FOR FINAL MARGIN STATUS. - SEE ONCOLOGY TEMPLATE. 2. 4 left axillary sentinel lymph nodes were negative 3. Additional resection of left posterior, superior, anterior and inferior margins  were benign.    02/16/2017 Surgery   breast lumpectomy with radioactive seed and axillary sentinel lymph node biposy  with Dr. Dwain Sarna   02/16/2017 Oncotype testing   Testing resulted in reccurance score of 12.   02/16/2017 Pathology Results   Left breast lumpectomy showed a 2.1 cm invasive grade 1 lobular carcinoma, associated LCIS and atypical lobular hyperplasia. Surgical margins were negative. 4 sentinel lymph nodes were negative.   02/16/2017 Oncotype testing   RS 12, low risk, predicts 10 year risk of distant recurrence 8% with tamoxifen, adjuvant chemotherapy was not recommended.   03/09/2017 Imaging    IMPRESSION: Large complex fluid collection extending throughout the upper left breast, 3:00 to 9:00 axes, at posterior depth, presumed hematoma, possibly multiloculated.   03/18/2017 Surgery    Evacuation seroma left bresat with Dr. Dwain Sarna    04/21/2017 - 06/08/2017 Radiation Therapy   Patient received radiation treatment with Dr. Mitzi Hansen   06/2017 -  Anti-estrogen oral therapy   Letrozole daily. Switch to Exemestane in 11/2017 due to joint pain.    09/03/2017 Survivorship   Survivorship Clinic with NP    01/19/2018 Mammogram   IMPRESSION: No mammographic evidence of breast malignancy.      INTERVAL HISTORY:  [name] is here for a follow up of left breast cancer. She was last seen by me on 11/05/2022. She presents to the clinic alone. Pt state she is doing well, and has no issues with Exemestane. Pt denies have joint pain but has some intermittent breast pain on her left side.      All other systems were reviewed with the patient and are negative.  MEDICAL HISTORY:  Past Medical History:  Diagnosis Date   Arthritis    Breast cancer (HCC) 01/15/2017   left breast   Cancer (HCC)    GERD (gastroesophageal reflux disease)    HLD (hyperlipidemia)    Hypertension    Hypothyroidism    Obesity    Overactive bladder    Palpitation    Personal history of radiation therapy    Thyroid disease     SURGICAL HISTORY: Past Surgical History:  Procedure Laterality Date   bladder  stem stretch  1966   BREAST LUMPECTOMY Left 02/16/2017   BREAST LUMPECTOMY WITH RADIOACTIVE SEED AND SENTINEL LYMPH NODE BIOPSY Left 02/16/2017   Procedure: BREAST LUMPECTOMY WITH RADIOACTIVE SEED AND AXILLARY SENTINEL LYMPH NODE BIOPSY;  Surgeon: Emelia Loron, MD;  Location: [facility];  Service: General;  Laterality: Left;   EVACUATION BREAST HEMATOMA Left 03/18/2017   Procedure: EVACUATION SEROMA LEFT BREAST;  Surgeon: Emelia Loron, MD;  Location: [facility];  Service: General;  Laterality: Left;   JOINT REPLACEMENT     KNEE ARTHROSCOPY     LEFT   NOSE SURGERY  1980   SHOULDER SURGERY Left 2007   TONSILLECTOMY  1964   TOTAL HIP ARTHROPLASTY Left 10/23/2015   Procedure: TOTAL HIP ARTHROPLASTY ANTERIOR APPROACH;  Surgeon: Marcene Corning, MD;  Location: [facility];  Service: Orthopedics;  Laterality: Left;   TRANSFORAMINAL LUMBAR INTERBODY FUSION (TLIF) WITH PEDICLE SCREW FIXATION 2 LEVEL Left 08/28/2022   Procedure: LEFT-SIDED LUMBAR 3- LUMBAR 4, LUMBAR 4- LUMBAR 5 TRANSFORAMINAL LUMBAR INTERBODY FUSION AND DECOMPRESSION WITH INSTRUMENTATION AND ALLOGRAFT;  Surgeon: Estill Bamberg, MD;  Location: [facility];  Service: Orthopedics;  Laterality: Left;    I have reviewed the social history and family history with the patient and they are unchanged from previous note.  ALLERGIES:  has  No Known Allergies.  MEDICATIONS:  Current Outpatient Medications  Medication Sig Dispense Refill   acetaminophen (TYLENOL) 500 MG tablet Take 500-1,000 mg by mouth every 6 (six) hours as needed for moderate pain.     clobetasol cream (TEMOVATE) 0.05 % APPLY 1 APPLICATION TOPICALLY 2 (TWO) TIMES DAILY AS NEEDED (ITCHING). 30 g 6   cyclobenzaprine (FLEXERIL) 5 MG tablet Take 5-10 mg by mouth at bedtime as needed for spasms.     diphenhydramine-acetaminophen (TYLENOL PM) 25-500 MG TABS tablet Take 1 tablet by mouth at bedtime as needed (sleep).     exemestane (AROMASIN) 25 MG tablet Take  1 tablet (25 mg total) by mouth daily after breakfast. 90 tablet 3   gabapentin (NEURONTIN) 100 MG capsule Take 1 capsule (100 mg total) by mouth 3 (three) times daily. (Patient taking differently: Take 100-200 mg by mouth at bedtime.) 90 capsule 1   hydrochlorothiazide (HYDRODIURIL) 25 MG tablet TAKE 1 TABLET BY MOUTH EVERY DAY 90 tablet 0   levothyroxine (SYNTHROID) 75 MCG tablet TAKE 1 TABLET BY MOUTH EVERY DAY 90 tablet 3   metoprolol succinate (TOPROL-XL) 25 MG 24 hr tablet TAKE 1 TABLET BY MOUTH EVERY DAY 90 tablet 0   pantoprazole (PROTONIX) 40 MG tablet Take 1 tablet (40 mg total) by mouth daily. 90 tablet 3   simvastatin (ZOCOR) 20 MG tablet TAKE 1 TABLET BY MOUTH EVERY DAY 90 tablet 0   VITAMIN D PO Take 1 capsule by mouth daily.     No current facility-administered medications for this visit.    PHYSICAL EXAMINATION: ECOG PERFORMANCE STATUS: 0 - Asymptomatic  Vitals:   05/06/23 0830  BP: 127/80  Pulse: 62  Resp: 18  Temp: 98.1 F (36.7 C)  SpO2: 100%   Wt Readings from Last 3 Encounters:  05/06/23 205 lb 14.4 oz (93.4 kg)  02/25/23 226 lb 12.8 oz (102.9 kg)  02/02/23 235 lb 14.4 oz (107 kg)     GENERAL:alert, no distress and comfortable SKIN: skin color normal, no rashes or significant lesions EYES: normal, Conjunctiva are pink and non-injected, sclera clear  NEURO: alert & oriented x 3 with fluent speech NECK: (-)supple, thyroid normal size, (-)non-tender, without nodularity LYMPH:(-)  no palpable lymphadenopathy in the cervical, axillary  LUNGS: (-) clear to auscultation and percussion with normal breathing effort HEART: (-) regular rate & rhythm and no murmurs and (-)no lower extremity edema ABDOMEN:(-)abdomen soft, (-) non-tender and (-) normal bowel sounds BREAST: RT breast no palpable mass, LT breast lumpectomy no scar tissue, appear to be dar in color dur to radiation. No palpable mass breast exam benign.      LABORATORY DATA:  I have reviewed the data  as listed    Latest Ref Rng & Units 05/06/2023    7:52 AM 11/05/2022    7:56 AM 08/19/2022    8:15 AM  CBC  WBC 4.0 - 10.5 K/uL 5.8  6.6  6.1   Hemoglobin 12.0 - 15.0 g/dL 40.3  47.4  25.9   Hematocrit 36.0 - 46.0 % 43.1  39.7  42.0   Platelets 150 - 400 K/uL 321  349  349         Latest Ref Rng & Units 05/06/2023    7:52 AM 11/05/2022    7:56 AM 08/19/2022    8:15 AM  CMP  Glucose 70 - 99 mg/dL 97  563  875   BUN 8 - 23 mg/dL 11  15  13    Creatinine 0.44 -  1.00 mg/dL 1.61  0.96  0.45   Sodium 135 - 145 mmol/L 139  140  138   Potassium 3.5 - 5.1 mmol/L 3.5  4.1  4.1   Chloride 98 - 111 mmol/L 103  106  103   CO2 22 - 32 mmol/L 29  26  26    Calcium 8.9 - 10.3 mg/dL 9.6  9.7  9.6   Total Protein 6.5 - 8.1 g/dL 7.9  8.3    Total Bilirubin 0.3 - 1.2 mg/dL 0.6  0.6    Alkaline Phos 38 - 126 U/L 95  109    AST 15 - 41 U/L 17  40    ALT 0 - 44 U/L 16  52        RADIOGRAPHIC STUDIES: I have personally reviewed the radiological images as listed and agreed with the findings in the report. No results found.    Orders Placed This Encounter  Procedures   MM 3D SCREENING MAMMOGRAM BILATERAL BREAST    Standing Status:   Future    Order Specific Question:   Reason for Exam (SYMPTOM  OR DIAGNOSIS REQUIRED)    Answer:   routine screening    Order Specific Question:   Preferred imaging location?    Answer:   [facility]   All questions were answered. The patient knows to call the clinic with any problems, questions or concerns. No barriers to learning was detected. The total time spent in the appointment was 20 minutes.     Malachy Mood, MD 05/06/2023   Carolin Coy, CMA, am acting as scribe for Malachy Mood, MD.   I have reviewed the above documentation for accuracy and completeness, and I agree with the above.

## 2023-05-06 ENCOUNTER — Inpatient Hospital Stay: Payer: BC Managed Care – PPO | Admitting: Hematology

## 2023-05-06 ENCOUNTER — Encounter: Payer: Self-pay | Admitting: Hematology

## 2023-05-06 ENCOUNTER — Inpatient Hospital Stay: Payer: BC Managed Care – PPO | Attending: Hematology

## 2023-05-06 ENCOUNTER — Other Ambulatory Visit: Payer: Self-pay

## 2023-05-06 VITALS — BP 127/80 | HR 62 | Temp 98.1°F | Resp 18 | Ht 66.0 in | Wt 205.9 lb

## 2023-05-06 DIAGNOSIS — Z79811 Long term (current) use of aromatase inhibitors: Secondary | ICD-10-CM | POA: Diagnosis not present

## 2023-05-06 DIAGNOSIS — C50412 Malignant neoplasm of upper-outer quadrant of left female breast: Secondary | ICD-10-CM | POA: Insufficient documentation

## 2023-05-06 DIAGNOSIS — Z1231 Encounter for screening mammogram for malignant neoplasm of breast: Secondary | ICD-10-CM

## 2023-05-06 DIAGNOSIS — M8580 Other specified disorders of bone density and structure, unspecified site: Secondary | ICD-10-CM | POA: Diagnosis not present

## 2023-05-06 DIAGNOSIS — Z170 Estrogen receptor positive status [ER+]: Secondary | ICD-10-CM

## 2023-05-06 LAB — CMP (CANCER CENTER ONLY)
ALT: 16 U/L (ref 0–44)
AST: 17 U/L (ref 15–41)
Albumin: 4.4 g/dL (ref 3.5–5.0)
Alkaline Phosphatase: 95 U/L (ref 38–126)
Anion gap: 7 (ref 5–15)
BUN: 11 mg/dL (ref 8–23)
CO2: 29 mmol/L (ref 22–32)
Calcium: 9.6 mg/dL (ref 8.9–10.3)
Chloride: 103 mmol/L (ref 98–111)
Creatinine: 0.93 mg/dL (ref 0.44–1.00)
GFR, Estimated: >60 mL/min (ref >60)
Glucose, Bld: 97 mg/dL (ref 70–99)
Potassium: 3.5 mmol/L (ref 3.5–5.1)
Sodium: 139 mmol/L (ref 135–145)
Total Bilirubin: 0.6 mg/dL (ref 0.3–1.2)
Total Protein: 7.9 g/dL (ref 6.5–8.1)

## 2023-05-06 LAB — CBC WITH DIFFERENTIAL (CANCER CENTER ONLY)
Abs Immature Granulocytes: 0.01 10*3/uL (ref 0.00–0.07)
Basophils Absolute: 0 10*3/uL (ref 0.0–0.1)
Basophils Relative: 1 %
Eosinophils Absolute: 0.3 10*3/uL (ref 0.0–0.5)
Eosinophils Relative: 4 %
HCT: 43.1 % (ref 36.0–46.0)
Hemoglobin: 14.1 g/dL (ref 12.0–15.0)
Immature Granulocytes: 0 %
Lymphocytes Relative: 24 %
Lymphs Abs: 1.4 10*3/uL (ref 0.7–4.0)
MCH: 28.6 pg (ref 26.0–34.0)
MCHC: 32.7 g/dL (ref 30.0–36.0)
MCV: 87.4 fL (ref 80.0–100.0)
Monocytes Absolute: 0.5 10*3/uL (ref 0.1–1.0)
Monocytes Relative: 9 %
Neutro Abs: 3.6 10*3/uL (ref 1.7–7.7)
Neutrophils Relative %: 62 %
Platelet Count: 321 10*3/uL (ref 150–400)
RBC: 4.93 MIL/uL (ref 3.87–5.11)
RDW: 13.8 % (ref 11.5–15.5)
WBC Count: 5.8 10*3/uL (ref 4.0–10.5)
nRBC: 0 % (ref 0.0–0.2)

## 2023-05-06 LAB — SAMPLE TO BLOOD BANK

## 2023-05-16 ENCOUNTER — Other Ambulatory Visit: Payer: Self-pay | Admitting: Internal Medicine

## 2023-05-21 ENCOUNTER — Encounter: Payer: Self-pay | Admitting: Internal Medicine

## 2023-05-21 ENCOUNTER — Ambulatory Visit (INDEPENDENT_AMBULATORY_CARE_PROVIDER_SITE_OTHER): Payer: BC Managed Care – PPO | Admitting: Internal Medicine

## 2023-05-21 VITALS — BP 120/78 | HR 71 | Temp 98.2°F | Ht 66.0 in | Wt 202.8 lb

## 2023-05-21 DIAGNOSIS — Z0000 Encounter for general adult medical examination without abnormal findings: Secondary | ICD-10-CM | POA: Diagnosis not present

## 2023-05-21 DIAGNOSIS — Z170 Estrogen receptor positive status [ER+]: Secondary | ICD-10-CM

## 2023-05-21 DIAGNOSIS — M5441 Lumbago with sciatica, right side: Secondary | ICD-10-CM

## 2023-05-21 DIAGNOSIS — Z23 Encounter for immunization: Secondary | ICD-10-CM

## 2023-05-21 DIAGNOSIS — E039 Hypothyroidism, unspecified: Secondary | ICD-10-CM | POA: Diagnosis not present

## 2023-05-21 DIAGNOSIS — E782 Mixed hyperlipidemia: Secondary | ICD-10-CM | POA: Diagnosis not present

## 2023-05-21 DIAGNOSIS — I10 Essential (primary) hypertension: Secondary | ICD-10-CM

## 2023-05-21 DIAGNOSIS — C50412 Malignant neoplasm of upper-outer quadrant of left female breast: Secondary | ICD-10-CM

## 2023-05-21 DIAGNOSIS — G8929 Other chronic pain: Secondary | ICD-10-CM

## 2023-05-21 LAB — LIPID PANEL
Cholesterol: 150 mg/dL (ref 0–200)
HDL: 41.8 mg/dL (ref >39.00)
LDL Cholesterol: 91 mg/dL (ref 0–99)
NonHDL: 108.38
Total CHOL/HDL Ratio: 4
Triglycerides: 86 mg/dL (ref 0.0–149.0)
VLDL: 17.2 mg/dL (ref 0.0–40.0)

## 2023-05-21 LAB — TSH: TSH: 3.04 u[IU]/mL (ref 0.35–5.50)

## 2023-05-21 MED ORDER — LEVOTHYROXINE SODIUM 75 MCG PO TABS: 75.0000 ug | ORAL_TABLET | Freq: Every day | ORAL | 3 refills | Status: DC

## 2023-05-21 MED ORDER — HYDROCHLOROTHIAZIDE 25 MG PO TABS: 25.0000 mg | ORAL_TABLET | Freq: Every day | ORAL | 3 refills | Status: DC

## 2023-05-21 MED ORDER — SIMVASTATIN 20 MG PO TABS: 20.0000 mg | ORAL_TABLET | Freq: Every day | ORAL | 3 refills | Status: DC

## 2023-05-21 MED ORDER — PANTOPRAZOLE SODIUM 40 MG PO TBEC: 40.0000 mg | DELAYED_RELEASE_TABLET | Freq: Every day | ORAL | 3 refills | Status: DC

## 2023-05-21 MED ORDER — METOPROLOL SUCCINATE ER 25 MG PO TB24: 25.0000 mg | ORAL_TABLET | Freq: Every day | ORAL | 3 refills | Status: DC

## 2023-05-21 NOTE — Progress Notes (Signed)
   Subjective:   Patient ID: [name], female    DOB: 10-08-1956, 67 y.o.   MRN: [id number]  HPI The patient is here for physical.  PMH, [facility], social history reviewed and updated  Review of Systems  Constitutional: Negative.   HENT: Negative.    Eyes: Negative.   Respiratory:  Negative for cough, chest tightness and shortness of breath.   Cardiovascular:  Negative for chest pain, palpitations and leg swelling.  Gastrointestinal:  Negative for abdominal distention, abdominal pain, constipation, diarrhea, nausea and vomiting.  Musculoskeletal:  Positive for arthralgias.  Skin: Negative.   Neurological: Negative.   Psychiatric/Behavioral: Negative.      Objective:  Physical Exam Constitutional:      Appearance: She is well-developed.  HENT:     Head: Normocephalic and atraumatic.  Cardiovascular:     Rate and Rhythm: Normal rate and regular rhythm.  Pulmonary:     Effort: Pulmonary effort is normal. No respiratory distress.     Breath sounds: Normal breath sounds. No wheezing or rales.  Abdominal:     General: Bowel sounds are normal. There is no distension.     Palpations: Abdomen is soft.     Tenderness: There is no abdominal tenderness. There is no rebound.  Musculoskeletal:     Cervical back: Normal range of motion.  Skin:    General: Skin is warm and dry.  Neurological:     Mental Status: She is alert and oriented to person, place, and time.     Coordination: Coordination normal.     Vitals:   05/21/23 0823  BP: 120/78  Pulse: 71  Temp: 98.2 F (36.8 C)  TempSrc: Oral  SpO2: 98%  Weight: 202 lb 12.8 oz (92 kg)  Height: 5\' 6"  (1.676 m)    Assessment & Plan:  Shingrix IM given at visit

## 2023-05-22 NOTE — Assessment & Plan Note (Signed)
Checking TSH and adjust synthroid 75 mcg daily as needed.  

## 2023-05-22 NOTE — Assessment & Plan Note (Signed)
Much improved since surgery and way more active than last year.

## 2023-05-22 NOTE — Assessment & Plan Note (Signed)
Taking exemestane and doing well overall. Continue yearly mammogram.

## 2023-05-22 NOTE — Assessment & Plan Note (Signed)
Checking lipid panel and adjust simvastatin 20 mg daily as needed.  

## 2023-05-22 NOTE — Assessment & Plan Note (Signed)
Flu shot yearly. Shingrix 1st given today. Tetanus due. Colonoscopy up to date. Mammogram up to date, pap smear up to date. Counseled about sun safety and mole surveillance. Counseled about the dangers of distracted driving. Given 10 year screening recommendations.

## 2023-05-22 NOTE — Assessment & Plan Note (Signed)
BP at goal on hctz 25 mg daily and recent CMP at goal no change today. Continue.

## 2023-07-22 ENCOUNTER — Encounter (INDEPENDENT_AMBULATORY_CARE_PROVIDER_SITE_OTHER): Payer: Self-pay

## 2023-07-23 ENCOUNTER — Encounter (INDEPENDENT_AMBULATORY_CARE_PROVIDER_SITE_OTHER): Payer: Self-pay

## 2023-08-19 ENCOUNTER — Ambulatory Visit (INDEPENDENT_AMBULATORY_CARE_PROVIDER_SITE_OTHER): Payer: BC Managed Care – PPO

## 2023-08-19 DIAGNOSIS — Z23 Encounter for immunization: Secondary | ICD-10-CM

## 2024-01-01 ENCOUNTER — Other Ambulatory Visit: Payer: Self-pay | Admitting: Hematology

## 2024-02-18 ENCOUNTER — Ambulatory Visit: Payer: BC Managed Care – PPO | Admitting: Internal Medicine

## 2024-02-18 ENCOUNTER — Ambulatory Visit (INDEPENDENT_AMBULATORY_CARE_PROVIDER_SITE_OTHER): Payer: BC Managed Care – PPO

## 2024-02-18 ENCOUNTER — Encounter: Payer: Self-pay | Admitting: Internal Medicine

## 2024-02-18 VITALS — BP 138/80 | HR 76 | Temp 98.1°F | Ht 66.0 in | Wt 177.0 lb

## 2024-02-18 DIAGNOSIS — R29898 Other symptoms and signs involving the musculoskeletal system: Secondary | ICD-10-CM | POA: Diagnosis not present

## 2024-02-18 NOTE — Progress Notes (Unsigned)
   Subjective:   Patient ID: [name], female    DOB: 04/14/1956, 68 y.o.   MRN: [id number]  HPI The patient is a 68 YO female coming in for prominence at the xiphoid process and some nausea if you press on that area. Noticed a few weeks or so ago unsure if it was present previous to that.   Review of Systems  Constitutional: Negative.   HENT: Negative.    Eyes: Negative.   Respiratory:  Negative for cough, chest tightness and shortness of breath.   Cardiovascular:  Negative for chest pain, palpitations and leg swelling.  Gastrointestinal:  Positive for nausea. Negative for abdominal distention, abdominal pain, constipation, diarrhea and vomiting.  Musculoskeletal: Negative.   Skin: Negative.   Neurological: Negative.   Psychiatric/Behavioral: Negative.      Objective:  Physical Exam Constitutional:      Appearance: She is well-developed.  HENT:     Head: Normocephalic and atraumatic.  Cardiovascular:     Rate and Rhythm: Normal rate and regular rhythm.     Comments: Bony prominence xiphoid process more prominent and mildly tender to palpation Pulmonary:     Effort: Pulmonary effort is normal. No respiratory distress.     Breath sounds: Normal breath sounds. No wheezing or rales.  Abdominal:     General: Bowel sounds are normal. There is no distension.     Palpations: Abdomen is soft.     Tenderness: There is no abdominal tenderness. There is no rebound.  Musculoskeletal:     Cervical back: Normal range of motion.  Skin:    General: Skin is warm and dry.  Neurological:     Mental Status: She is alert and oriented to person, place, and time.     Coordination: Coordination normal.     Vitals:   02/18/24 1017  BP: 138/80  Pulse: 76  Temp: 98.1 F (36.7 C)  TempSrc: Oral  SpO2: 98%  Weight: 177 lb (80.3 kg)  Height: 5\' 6"  (1.676 m)    Assessment & Plan:

## 2024-02-18 NOTE — Patient Instructions (Signed)
 We can do an x-ray if this changes.  We will try pepcid in the evening for a few weeks to see if this helps with the nausea.

## 2024-02-19 ENCOUNTER — Encounter: Payer: Self-pay | Admitting: Internal Medicine

## 2024-02-19 DIAGNOSIS — R29898 Other symptoms and signs involving the musculoskeletal system: Secondary | ICD-10-CM | POA: Insufficient documentation

## 2024-02-19 NOTE — Assessment & Plan Note (Signed)
 Checking x-ray sternum and chest x-ray to rule out lung or bony process. Suspect hiatal hernia causing some nausea and prominence. Counseled about strategies.

## 2024-03-04 ENCOUNTER — Encounter: Payer: Self-pay | Admitting: Internal Medicine

## 2024-03-10 ENCOUNTER — Encounter: Payer: Self-pay | Admitting: Internal Medicine

## 2024-03-11 ENCOUNTER — Other Ambulatory Visit: Payer: Self-pay | Admitting: Internal Medicine

## 2024-03-11 DIAGNOSIS — M898X8 Other specified disorders of bone, other site: Secondary | ICD-10-CM

## 2024-03-18 ENCOUNTER — Ambulatory Visit
Admission: RE | Admit: 2024-03-18 | Discharge: 2024-03-18 | Disposition: A | Discharge status: Home / Self Care | Source: Ambulatory Visit | Attending: Internal Medicine | Admitting: Internal Medicine

## 2024-03-18 DIAGNOSIS — M898X8 Other specified disorders of bone, other site: Secondary | ICD-10-CM

## 2024-03-22 ENCOUNTER — Encounter: Payer: Self-pay | Admitting: Internal Medicine

## 2024-04-04 ENCOUNTER — Ambulatory Visit
Admission: RE | Admit: 2024-04-04 | Discharge: 2024-04-04 | Disposition: A | Discharge status: Home / Self Care | Source: Ambulatory Visit | Attending: Hematology | Admitting: Hematology

## 2024-04-04 DIAGNOSIS — Z170 Estrogen receptor positive status [ER+]: Secondary | ICD-10-CM

## 2024-05-02 NOTE — Progress Notes (Unsigned)
 [facility]     Telephone:(336) [phone] Fax:(336) [phone]    Patient Care Team: Adelia Homestead, MD as PCP - General (Internal Medicine) Debbie Fails, Laura Polio, NP as Nurse Practitioner (Hematology and Oncology) Sonja Gahanna, MD as Consulting Physician (Hematology) Johna Myers, MD as Consulting Physician (Radiation Oncology) Enid Harry, MD as Consulting Physician (General Surgery)   CHIEF COMPLAINT: Follow up left breast cancer   Oncology History Overview Note  Cancer Staging Malignant neoplasm of upper-outer quadrant of left breast in female, estrogen receptor positive (HCC) Staging form: Breast, AJCC 8th Edition - Clinical stage from 01/15/2017: Stage IA (cT1c, cN0, cM0, G1, ER: Positive, PR: Positive, HER2: Negative) - Signed by Sonja Jennerstown, MD on 01/27/2017    Malignant neoplasm of upper-outer quadrant of left breast in female, estrogen receptor positive (HCC)  01/15/2017 Initial Biopsy   Left breast 2:00 core needle biopsy showed invasive lobular carcinoma, atypical lobular hyperplasia. Grade 1.   01/15/2017 Receptors her2   ER 90% positive, PR 90% positive, HER-2 negative, Ki-67 1%   01/15/2017 Mammogram   Bilateral diagnostic mammogram and ultrasound showed irregular hypoechoic mass in the left breast 2:00 position, 9 cm from the nipple, measuring 1.2 x 1.0 x 0.8 cm, no evidence of malignancy within the right breast, ultrasound of the left axilla was negative. Breast density category B   01/15/2017 Initial Diagnosis   Malignant neoplasm of upper-outer quadrant of left breast in female, estrogen receptor positive (HCC)   02/16/2017 Pathology Results   Diagnosis 1. Breast, lumpectomy, Left - INVASIVE GRADE I LOBULAR CARCINOMA. - TUMOR IS ESTIMATED TO SPAN AT LEAST 2.1 CM IN GREATEST DIMENSION. 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 - INVASIVE TUMOR IS FOCALLY LESS THAN 0.1 CM TO LATERAL MARGIN BUT INKED MARGINAL  SURFACE IS NEGATIVE FOR TUMOR. - INITIAL ANTERIOR MARGIN CANNOT BE EFFECTIVELY CLEARED OF INVASIVE TUMOR, PLEASE SEE SPECIMEN #10 FOR FINAL MARGIN STATUS. - SEE ONCOLOGY TEMPLATE. 2. 4 left axillary sentinel lymph nodes were negative 3. Additional resection of left posterior, superior, anterior and inferior margins were benign.    02/16/2017 Surgery   breast lumpectomy with radioactive seed and axillary sentinel lymph node biposy with Dr. Delane Fear   02/16/2017 Oncotype testing   Testing resulted in reccurance score of 12.   02/16/2017 Pathology Results   Left breast lumpectomy showed a 2.1 cm invasive grade 1 lobular carcinoma, associated LCIS and atypical lobular hyperplasia. Surgical margins were negative. 4 sentinel lymph nodes were negative.   02/16/2017 Oncotype testing   RS 12, low risk, predicts 10 year risk of distant recurrence 8% with tamoxifen, adjuvant chemotherapy was not recommended.   03/09/2017 Imaging    IMPRESSION: Large complex fluid collection extending throughout the upper left breast, 3:00 to 9:00 axes, at posterior depth, presumed hematoma, possibly multiloculated.   03/18/2017 Surgery    Evacuation seroma left bresat with Dr. Delane Fear    04/21/2017 - 06/08/2017 Radiation Therapy   Patient received radiation treatment with Dr. Jeryl Moris   06/2017 -  Anti-estrogen oral therapy   Letrozole  daily. Switch to Exemestane  in 11/2017 due to joint pain.    09/03/2017 Survivorship   Survivorship Clinic with NP    01/19/2018 Mammogram   IMPRESSION: No mammographic evidence of breast malignancy.      CURRENT THERAPY: Antiestrogen therapy, starting 06/2017. Currently Exemestane  since 11/2017. Goal x7 years  INTERVAL HISTORY Ms. Mcclain returns for follow up as scheduled, last seen by Dr.  Maryalice Smaller 05/06/23. Mammogram 04/04/24 was benign.   ROS   Past Medical History:  Diagnosis Date   Arthritis    Breast cancer (HCC) 01/15/2017   left breast   Cancer (HCC)    GERD  (gastroesophageal reflux disease)    HLD (hyperlipidemia)    Hypertension    Hypothyroidism    Obesity    Overactive bladder    Palpitation    Personal history of radiation therapy    Thyroid  disease      Past Surgical History:  Procedure Laterality Date   bladder stem stretch  1966   BREAST LUMPECTOMY Left 02/16/2017   BREAST LUMPECTOMY WITH RADIOACTIVE SEED AND SENTINEL LYMPH NODE BIOPSY Left 02/16/2017   Procedure: BREAST LUMPECTOMY WITH RADIOACTIVE SEED AND AXILLARY SENTINEL LYMPH NODE BIOPSY;  Surgeon: Enid Harry, MD;  Location: [facility];  Service: General;  Laterality: Left;   EVACUATION BREAST HEMATOMA Left 03/18/2017   Procedure: EVACUATION SEROMA LEFT BREAST;  Surgeon: Enid Harry, MD;  Location: [facility];  Service: General;  Laterality: Left;   JOINT REPLACEMENT     KNEE ARTHROSCOPY     LEFT   NOSE SURGERY  1980   SHOULDER SURGERY Left 2007   TONSILLECTOMY  1964   TOTAL HIP ARTHROPLASTY Left 10/23/2015   Procedure: TOTAL HIP ARTHROPLASTY ANTERIOR APPROACH;  Surgeon: Dayne Even, MD;  Location: [facility];  Service: Orthopedics;  Laterality: Left;   TRANSFORAMINAL LUMBAR INTERBODY FUSION (TLIF) WITH PEDICLE SCREW FIXATION 2 LEVEL Left 08/28/2022   Procedure: LEFT-SIDED LUMBAR 3- LUMBAR 4, LUMBAR 4- LUMBAR 5 TRANSFORAMINAL LUMBAR INTERBODY FUSION AND DECOMPRESSION WITH INSTRUMENTATION AND ALLOGRAFT;  Surgeon: Virl Grimes, MD;  Location: [facility];  Service: Orthopedics;  Laterality: Left;     Outpatient Encounter Medications as of 05/03/2024  Medication Sig Note   acetaminophen  (TYLENOL ) 500 MG tablet Take 500-1,000 mg by mouth every 6 (six) hours as needed for moderate pain.    clobetasol  cream (TEMOVATE ) 0.05 % APPLY 1 APPLICATION TOPICALLY 2 (TWO) TIMES DAILY AS NEEDED (ITCHING).    cyclobenzaprine  (FLEXERIL ) 5 MG tablet Take 5-10 mg by mouth at bedtime as needed for spasms. (Patient not taking: Reported on 05/21/2023)     diphenhydramine -acetaminophen  (TYLENOL  PM) 25-500 MG TABS tablet Take 1 tablet by mouth at bedtime as needed (sleep).    exemestane  (AROMASIN ) 25 MG tablet TAKE 1 TABLET (25 MG TOTAL) BY MOUTH DAILY AFTER BREAKFAST.    gabapentin  (NEURONTIN ) 100 MG capsule Take 1 capsule (100 mg total) by mouth 3 (three) times daily. (Patient not taking: Reported on 05/21/2023) 08/13/2022: This is on hold while taking 300 mg, pt will switch from taking 300 to this after procedure    hydrochlorothiazide  (HYDRODIURIL ) 25 MG tablet Take 1 tablet (25 mg total) by mouth daily.    levothyroxine  (SYNTHROID ) 75 MCG tablet Take 1 tablet (75 mcg total) by mouth daily.    metoprolol  succinate (TOPROL -XL) 25 MG 24 hr tablet Take 1 tablet (25 mg total) by mouth daily.    pantoprazole  (PROTONIX ) 40 MG tablet Take 1 tablet (40 mg total) by mouth daily.    simvastatin  (ZOCOR ) 20 MG tablet Take 1 tablet (20 mg total) by mouth daily.    VITAMIN D  PO Take 1 capsule by mouth daily.    No facility-administered encounter medications on file as of 05/03/2024.     There were no vitals filed for this visit. There is no height or weight on file to calculate BMI.   ECOG PERFORMANCE  STATUS: {CHL ONC ECOG [phone]}  PHYSICAL EXAM GENERAL:alert, no distress and comfortable SKIN: no rash  EYES: sclera clear NECK: without mass LYMPH:  no palpable cervical or supraclavicular lymphadenopathy  LUNGS: clear with normal breathing effort HEART: regular rate & rhythm, no lower extremity edema ABDOMEN: abdomen soft, non-tender and normal bowel sounds NEURO: alert & oriented x 3 with fluent speech, no focal motor/sensory deficits Breast exam:  PAC without erythema    CBC    Latest Ref Rng & Units 05/06/2023    7:52 AM 11/05/2022    7:56 AM 08/19/2022    8:15 AM  CBC  WBC 4.0 - 10.5 K/uL 5.8  6.6  6.1   Hemoglobin 12.0 - 15.0 g/dL 16.1  09.6  04.5   Hematocrit 36.0 - 46.0 % 43.1  39.7  42.0   Platelets 150 - 400 K/uL 321  349  349        CMP     Latest Ref Rng & Units 05/06/2023    7:52 AM 11/05/2022    7:56 AM 08/19/2022    8:15 AM  CMP  Glucose 70 - 99 mg/dL 97  409  811   BUN 8 - 23 mg/dL 11  15  13    Creatinine 0.44 - 1.00 mg/dL 9.14  7.82  9.56   Sodium 135 - 145 mmol/L 139  140  138   Potassium 3.5 - 5.1 mmol/L 3.5  4.1  4.1   Chloride 98 - 111 mmol/L 103  106  103   CO2 22 - 32 mmol/L 29  26  26    Calcium 8.9 - 10.3 mg/dL 9.6  9.7  9.6   Total Protein 6.5 - 8.1 g/dL 7.9  8.3    Total Bilirubin 0.3 - 1.2 mg/dL 0.6  0.6    Alkaline Phos 38 - 126 U/L 95  109    AST 15 - 41 U/L 17  40    ALT 0 - 44 U/L 16  52        ASSESSMENT & PLAN:  Malignant neoplasm of upper-outer quadrant of left breast in female, estrogen receptor positive, invasive lobular carcinoma, grade 1, pT2N0M0, ER+/PR+/HER2-, Oncotype RS low risk -diagnosed in 12/2016. S/p left breast lumpectomy 02/16/17, path showed 2.1+ cm ILC and LCIS. Oncotype RS of 12, low risk. -She started adjuvant letrozole  in 06/2017. Due to joint pain and headaches, she switched to exemestane  in 11/2017. She is tolerating very well with no noticeable side effects. Goal x7 years    Osteopenia -Bone density scan in August 2023 showed osteopenia, with T-score -1.7 at femur neck.  No high risk for fracture -He is on vitamin D , I encouraged her to add on calcium supplement -I encouraged her to do weightbearing exercise  PLAN:  No orders of the defined types were placed in this encounter.     All questions were answered. The patient knows to call the clinic with any problems, questions or concerns. No barriers to learning were detected. I spent *** counseling the patient face to face. The total time spent in the appointment was *** and more than 50% was on counseling, review of test results, and coordination of care.   Teryl Mcconaghy K Tashaun Obey, NP 05/02/2024 9:29 AM

## 2024-05-03 ENCOUNTER — Encounter: Payer: Self-pay | Admitting: Nurse Practitioner

## 2024-05-03 ENCOUNTER — Inpatient Hospital Stay: Payer: BC Managed Care – PPO | Admitting: Nurse Practitioner

## 2024-05-03 ENCOUNTER — Other Ambulatory Visit: Payer: Self-pay

## 2024-05-03 ENCOUNTER — Inpatient Hospital Stay: Payer: BC Managed Care – PPO | Attending: Hematology

## 2024-05-03 VITALS — BP 122/76 | HR 62 | Temp 97.8°F | Resp 17 | Wt 178.6 lb

## 2024-05-03 DIAGNOSIS — M85859 Other specified disorders of bone density and structure, unspecified thigh: Secondary | ICD-10-CM | POA: Insufficient documentation

## 2024-05-03 DIAGNOSIS — C50412 Malignant neoplasm of upper-outer quadrant of left female breast: Secondary | ICD-10-CM

## 2024-05-03 DIAGNOSIS — Z170 Estrogen receptor positive status [ER+]: Secondary | ICD-10-CM | POA: Diagnosis not present

## 2024-05-03 DIAGNOSIS — R911 Solitary pulmonary nodule: Secondary | ICD-10-CM | POA: Insufficient documentation

## 2024-05-03 DIAGNOSIS — Z79811 Long term (current) use of aromatase inhibitors: Secondary | ICD-10-CM | POA: Diagnosis not present

## 2024-05-03 LAB — CBC WITH DIFFERENTIAL (CANCER CENTER ONLY)
Abs Immature Granulocytes: 0.03 10*3/uL (ref 0.00–0.07)
Basophils Absolute: 0.1 10*3/uL (ref 0.0–0.1)
Basophils Relative: 1 %
Eosinophils Absolute: 0.2 10*3/uL (ref 0.0–0.5)
Eosinophils Relative: 4 %
HCT: 40.2 % (ref 36.0–46.0)
Hemoglobin: 13.7 g/dL (ref 12.0–15.0)
Immature Granulocytes: 1 %
Lymphocytes Relative: 25 %
Lymphs Abs: 1.4 10*3/uL (ref 0.7–4.0)
MCH: 30.4 pg (ref 26.0–34.0)
MCHC: 34.1 g/dL (ref 30.0–36.0)
MCV: 89.3 fL (ref 80.0–100.0)
Monocytes Absolute: 0.6 10*3/uL (ref 0.1–1.0)
Monocytes Relative: 11 %
Neutro Abs: 3.2 10*3/uL (ref 1.7–7.7)
Neutrophils Relative %: 58 %
Platelet Count: 286 10*3/uL (ref 150–400)
RBC: 4.5 MIL/uL (ref 3.87–5.11)
RDW: 12.9 % (ref 11.5–15.5)
WBC Count: 5.5 10*3/uL (ref 4.0–10.5)
nRBC: 0 % (ref 0.0–0.2)

## 2024-05-03 LAB — CMP (CANCER CENTER ONLY)
ALT: 23 U/L (ref 0–44)
AST: 21 U/L (ref 15–41)
Albumin: 4.4 g/dL (ref 3.5–5.0)
Alkaline Phosphatase: 94 U/L (ref 38–126)
Anion gap: 7 (ref 5–15)
BUN: 16 mg/dL (ref 8–23)
CO2: 29 mmol/L (ref 22–32)
Calcium: 9.8 mg/dL (ref 8.9–10.3)
Chloride: 101 mmol/L (ref 98–111)
Creatinine: 0.86 mg/dL (ref 0.44–1.00)
GFR, Estimated: >60 mL/min (ref >60)
Glucose, Bld: 91 mg/dL (ref 70–99)
Potassium: 3.7 mmol/L (ref 3.5–5.1)
Sodium: 137 mmol/L (ref 135–145)
Total Bilirubin: 0.7 mg/dL (ref 0.0–1.2)
Total Protein: 7.6 g/dL (ref 6.5–8.1)

## 2024-05-12 ENCOUNTER — Other Ambulatory Visit: Payer: Self-pay | Admitting: Internal Medicine

## 2024-05-14 ENCOUNTER — Other Ambulatory Visit: Payer: Self-pay | Admitting: Internal Medicine

## 2024-06-20 ENCOUNTER — Other Ambulatory Visit

## 2024-06-28 ENCOUNTER — Ambulatory Visit
Admission: RE | Admit: 2024-06-28 | Discharge: 2024-06-28 | Disposition: A | Discharge status: Home / Self Care | Source: Ambulatory Visit | Attending: Nurse Practitioner

## 2024-06-28 DIAGNOSIS — R911 Solitary pulmonary nodule: Secondary | ICD-10-CM

## 2024-06-28 DIAGNOSIS — C50412 Malignant neoplasm of upper-outer quadrant of left female breast: Secondary | ICD-10-CM

## 2024-06-29 NOTE — Assessment & Plan Note (Signed)
 invasive lobular carcinoma, grade 1, pT2N0M0, ER+/PR+/HER2-, Oncotype RS low risk -diagnosed in 12/2016. S/p left breast lumpectomy 02/16/17, path showed 2.1+ cm ILC and LCIS. -Oncotype RS of 12, low risk. -She started adjuvant letrozole  in 06/2017. Due to joint pain and headaches, she switched to exemestane  in 11/2017. She is tolerating very well with no noticeable side effects. -most recent MM 04/01/22 was negative. -She is clinically doing well except recent back surgery, which she is slowly healing from. Lab reviewed, overall WNL. Her physical exam showed some left breast tenderness and mild swelling, otherwise unremarkable. There is no high clinical concern for recurrence. -Continue Surveillance. Next mammogram in 03/2023; I ordered today. -Continue exemestane , plan for 7 years.

## 2024-06-30 ENCOUNTER — Inpatient Hospital Stay: Attending: Hematology | Admitting: Hematology

## 2024-06-30 DIAGNOSIS — Z170 Estrogen receptor positive status [ER+]: Secondary | ICD-10-CM | POA: Diagnosis not present

## 2024-06-30 DIAGNOSIS — Z79811 Long term (current) use of aromatase inhibitors: Secondary | ICD-10-CM | POA: Insufficient documentation

## 2024-06-30 DIAGNOSIS — C50412 Malignant neoplasm of upper-outer quadrant of left female breast: Secondary | ICD-10-CM | POA: Insufficient documentation

## 2024-06-30 DIAGNOSIS — Z1721 Progesterone receptor positive status: Secondary | ICD-10-CM | POA: Insufficient documentation

## 2024-07-08 ENCOUNTER — Other Ambulatory Visit: Payer: Self-pay

## 2024-07-08 ENCOUNTER — Encounter: Payer: Self-pay | Admitting: Hematology

## 2024-07-08 DIAGNOSIS — Z170 Estrogen receptor positive status [ER+]: Secondary | ICD-10-CM

## 2024-07-08 NOTE — Progress Notes (Signed)
 [facility]   Telephone:(336) [phone] Fax:(336) [phone]   Clinic Follow up Note   Patient Care Team: Rollene Almarie LABOR, MD as PCP - General (Internal Medicine) Crawford, Morna Pickle, NP as Nurse Practitioner (Hematology and Oncology) Lanny Callander, MD as Consulting Physician (Hematology) Dewey Rush, MD as Consulting Physician (Radiation Oncology) Ebbie Cough, MD as Consulting Physician (General Surgery) 07/08/2024  I connected with [name] on 07/08/24 at 10:00 AM EDT by telephone and verified that I am speaking with the correct person using two identifiers.   I discussed the limitations, risks, security and privacy concerns of performing an evaluation and management service by telephone and the availability of in person appointments. I also discussed with the patient that there may be a patient responsible charge related to this service. The patient expressed understanding and agreed to proceed.   Patient's location:  Home  Provider's location:  Office    CHIEF COMPLAINT: f/u CT results    CURRENT THERAPY: exemestane    Oncology history Malignant neoplasm of upper-outer quadrant of left breast in female, estrogen receptor positive (HCC)  invasive lobular carcinoma, grade 1, pT2N0M0, ER+/PR+/HER2-, Oncotype RS low risk -diagnosed in 12/2016. S/p left breast lumpectomy 02/16/17, path showed 2.1+ cm ILC and LCIS. -Oncotype RS of 12, low risk. -She started adjuvant letrozole  in 06/2017. Due to joint pain and headaches, she switched to exemestane  in 11/2017. She is tolerating very well with no noticeable side effects. -most recent MM 04/01/22 was negative. -She is clinically doing well except recent back surgery, which she is slowly healing from. Lab reviewed, overall WNL. Her physical exam showed some left breast tenderness and mild swelling, otherwise unremarkable. There is no high clinical concern for recurrence. -Continue Surveillance. Next mammogram in  03/2023; I ordered today. -Continue exemestane , plan for 7 years.    Assessment & Plan Lung nodule  - I personally reviewed her CT chest scan from June 28, 2024, which showed a 7 mm solid nodule in the posterior left upper lobe, similar to her CT scan from March 2025.  No other new nodules - She has lost a total of 40 to 50 pound weight since earlier this year, some are intentional with diet changes.  She has occasional cough, no other discomfort. She is a never smoker, her risk of lung cancer is lower. Due to her previous history of breast cancer, metastatic disease needs to be ruled out, although low suspicion. - Will obtain CT DNA guardianReveal in next few weeks.  If that is positive, will obtain PET scan.  If negative, will repeat a CT chest in 6 months  Breast cancer - On adjuvant exemestane , she will complete 7 years therapy at the end of this month. - Continue breast cancer surveillance   Plan - CT scan reviewed, will obtain guardian reveal in the next few weeks -f/u in 5 months with repeated CT chest   SUMMARY OF ONCOLOGIC HISTORY: Oncology History Overview Note  Cancer Staging Malignant neoplasm of upper-outer quadrant of left breast in female, estrogen receptor positive (HCC) Staging form: Breast, AJCC 8th Edition - Clinical stage from 01/15/2017: Stage IA (cT1c, cN0, cM0, G1, ER: Positive, PR: Positive, HER2: Negative) - Signed by Callander Lanny, MD on 01/27/2017    Malignant neoplasm of upper-outer quadrant of left breast in female, estrogen receptor positive (HCC)  01/15/2017 Initial Biopsy   Left breast 2:00 core needle biopsy showed invasive lobular carcinoma, atypical lobular hyperplasia. Grade 1.   01/15/2017 Receptors her2  ER 90% positive, PR 90% positive, HER-2 negative, Ki-67 1%   01/15/2017 Mammogram   Bilateral diagnostic mammogram and ultrasound showed irregular hypoechoic mass in the left breast 2:00 position, 9 cm from the nipple, measuring 1.2 x 1.0 x 0.8 cm, no  evidence of malignancy within the right breast, ultrasound of the left axilla was negative. Breast density category B   01/15/2017 Initial Diagnosis   Malignant neoplasm of upper-outer quadrant of left breast in female, estrogen receptor positive (HCC)   02/16/2017 Pathology Results   Diagnosis 1. Breast, lumpectomy, Left - INVASIVE GRADE I LOBULAR CARCINOMA. - TUMOR IS ESTIMATED TO SPAN AT LEAST 2.1 CM IN GREATEST DIMENSION. 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 - INVASIVE TUMOR IS FOCALLY LESS THAN 0.1 CM TO LATERAL MARGIN BUT INKED MARGINAL SURFACE IS NEGATIVE FOR TUMOR. - INITIAL ANTERIOR MARGIN CANNOT BE EFFECTIVELY CLEARED OF INVASIVE TUMOR, PLEASE SEE SPECIMEN #10 FOR FINAL MARGIN STATUS. - SEE ONCOLOGY TEMPLATE. 2. 4 left axillary sentinel lymph nodes were negative 3. Additional resection of left posterior, superior, anterior and inferior margins were benign.    02/16/2017 Surgery   breast lumpectomy with radioactive seed and axillary sentinel lymph node biposy with Dr. Ebbie   02/16/2017 Oncotype testing   Testing resulted in reccurance score of 12.   02/16/2017 Pathology Results   Left breast lumpectomy showed a 2.1 cm invasive grade 1 lobular carcinoma, associated LCIS and atypical lobular hyperplasia. Surgical margins were negative. 4 sentinel lymph nodes were negative.   02/16/2017 Oncotype testing   RS 12, low risk, predicts 10 year risk of distant recurrence 8% with tamoxifen, adjuvant chemotherapy was not recommended.   03/09/2017 Imaging    IMPRESSION: Large complex fluid collection extending throughout the upper left breast, 3:00 to 9:00 axes, at posterior depth, presumed hematoma, possibly multiloculated.   03/18/2017 Surgery    Evacuation seroma left bresat with Dr. Ebbie    04/21/2017 - 06/08/2017 Radiation Therapy   Patient received radiation treatment with Dr. Dewey   06/2017 -  Anti-estrogen oral therapy   Letrozole   daily. Switch to Exemestane  in 11/2017 due to joint pain.    09/03/2017 Survivorship   Survivorship Clinic with NP    01/19/2018 Mammogram   IMPRESSION: No mammographic evidence of breast malignancy.     Discussed the use of AI scribe software for clinical note transcription with the patient, who gave verbal consent to proceed.  History of Present Illness Patient is scheduled for a phone visit to review her CT scan findings.  She verified her identity.  She is clinically doing well overall, does report her overall 40 to 50 pound weight loss in the past 6 months, somewhat intentional with diet change.  She has occasional dry cough, no chest pain, or other new complaints.     REVIEW OF SYSTEMS:   Constitutional: Denies fevers, chills or abnormal weight loss Eyes: Denies blurriness of vision Ears, nose, mouth, throat, and face: Denies mucositis or sore throat Respiratory: Denies cough, dyspnea or wheezes Cardiovascular: Denies palpitation, chest discomfort or lower extremity swelling Gastrointestinal:  Denies nausea, heartburn or change in bowel habits Skin: Denies abnormal skin rashes Lymphatics: Denies new lymphadenopathy or easy bruising Neurological:Denies numbness, tingling or new weaknesses Behavioral/Psych: Mood is stable, no new changes  All other systems were reviewed with the patient and are negative.  MEDICAL HISTORY:  Past Medical History:  Diagnosis Date   Arthritis    Breast cancer (HCC) 01/15/2017   left breast  Cancer (HCC)    GERD (gastroesophageal reflux disease)    HLD (hyperlipidemia)    Hypertension    Hypothyroidism    Obesity    Overactive bladder    Palpitation    Personal history of radiation therapy    Thyroid  disease     SURGICAL HISTORY: Past Surgical History:  Procedure Laterality Date   bladder stem stretch  1966   BREAST LUMPECTOMY Left 02/16/2017   BREAST LUMPECTOMY WITH RADIOACTIVE SEED AND SENTINEL LYMPH NODE BIOPSY Left 02/16/2017    Procedure: BREAST LUMPECTOMY WITH RADIOACTIVE SEED AND AXILLARY SENTINEL LYMPH NODE BIOPSY;  Surgeon: Donnice Bury, MD;  Location: [facility];  Service: General;  Laterality: Left;   EVACUATION BREAST HEMATOMA Left 03/18/2017   Procedure: EVACUATION SEROMA LEFT BREAST;  Surgeon: Donnice Bury, MD;  Location: [facility];  Service: General;  Laterality: Left;   JOINT REPLACEMENT     KNEE ARTHROSCOPY     LEFT   NOSE SURGERY  1980   SHOULDER SURGERY Left 2007   TONSILLECTOMY  1964   TOTAL HIP ARTHROPLASTY Left 10/23/2015   Procedure: TOTAL HIP ARTHROPLASTY ANTERIOR APPROACH;  Surgeon: Maude Herald, MD;  Location: [facility];  Service: Orthopedics;  Laterality: Left;   TRANSFORAMINAL LUMBAR INTERBODY FUSION (TLIF) WITH PEDICLE SCREW FIXATION 2 LEVEL Left 08/28/2022   Procedure: LEFT-SIDED LUMBAR 3- LUMBAR 4, LUMBAR 4- LUMBAR 5 TRANSFORAMINAL LUMBAR INTERBODY FUSION AND DECOMPRESSION WITH INSTRUMENTATION AND ALLOGRAFT;  Surgeon: Beuford Anes, MD;  Location: [facility];  Service: Orthopedics;  Laterality: Left;    I have reviewed the social history and family history with the patient and they are unchanged from previous note.  ALLERGIES:  has no known allergies.  MEDICATIONS:  Current Outpatient Medications  Medication Sig Dispense Refill   acetaminophen  (TYLENOL ) 500 MG tablet Take 500-1,000 mg by mouth every 6 (six) hours as needed for moderate pain.     clobetasol  cream (TEMOVATE ) 0.05 % APPLY 1 APPLICATION TOPICALLY 2 (TWO) TIMES DAILY AS NEEDED (ITCHING). 30 g 6   diphenhydramine -acetaminophen  (TYLENOL  PM) 25-500 MG TABS tablet Take 1 tablet by mouth at bedtime as needed (sleep).     exemestane  (AROMASIN ) 25 MG tablet TAKE 1 TABLET (25 MG TOTAL) BY MOUTH DAILY AFTER BREAKFAST. 90 tablet 3   gabapentin  (NEURONTIN ) 100 MG capsule Take 1 capsule (100 mg total) by mouth 3 (three) times daily. 90 capsule 1   hydrochlorothiazide  (HYDRODIURIL ) 25 MG tablet TAKE 1  TABLET (25 MG TOTAL) BY MOUTH DAILY. 90 tablet 3   levothyroxine  (SYNTHROID ) 75 MCG tablet Take 1 tablet (75 mcg total) by mouth daily. 90 tablet 3   metoprolol  succinate (TOPROL -XL) 25 MG 24 hr tablet TAKE 1 TABLET (25 MG TOTAL) BY MOUTH DAILY. 90 tablet 3   pantoprazole  (PROTONIX ) 40 MG tablet TAKE 1 TABLET BY MOUTH EVERY DAY 90 tablet 3   simvastatin  (ZOCOR ) 20 MG tablet TAKE 1 TABLET BY MOUTH EVERY DAY 90 tablet 3   VITAMIN D  PO Take 1 capsule by mouth daily.     No current facility-administered medications for this visit.    PHYSICAL EXAMINATION: Not performed   LABORATORY DATA:  I have reviewed the data as listed    Latest Ref Rng & Units 05/03/2024    8:05 AM 05/06/2023    7:52 AM 11/05/2022    7:56 AM  CBC  WBC 4.0 - 10.5 K/uL 5.5  5.8  6.6   Hemoglobin 12.0 - 15.0 g/dL 86.2  85.8  86.8  Hematocrit 36.0 - 46.0 % 40.2  43.1  39.7   Platelets 150 - 400 K/uL 286  321  349         Latest Ref Rng & Units 05/03/2024    8:05 AM 05/06/2023    7:52 AM 11/05/2022    7:56 AM  CMP  Glucose 70 - 99 mg/dL 91  97  893   BUN 8 - 23 mg/dL 16  11  15    Creatinine 0.44 - 1.00 mg/dL 9.13  9.06  9.08   Sodium 135 - 145 mmol/L 137  139  140   Potassium 3.5 - 5.1 mmol/L 3.7  3.5  4.1   Chloride 98 - 111 mmol/L 101  103  106   CO2 22 - 32 mmol/L 29  29  26    Calcium 8.9 - 10.3 mg/dL 9.8  9.6  9.7   Total Protein 6.5 - 8.1 g/dL 7.6  7.9  8.3   Total Bilirubin 0.0 - 1.2 mg/dL 0.7  0.6  0.6   Alkaline Phos 38 - 126 U/L 94  95  109   AST 15 - 41 U/L 21  17  40   ALT 0 - 44 U/L 23  16  52       RADIOGRAPHIC STUDIES: I have personally reviewed the radiological images as listed and agreed with the findings in the report. No results found.     I discussed the assessment and treatment plan with the patient. The patient was provided an opportunity to ask questions and all were answered. The patient agreed with the plan and demonstrated an understanding of the instructions.   The patient  was advised to call back or seek an in-person evaluation if the symptoms worsen or if the condition fails to improve as anticipated.  I provided 25 minutes of non face-to-face telephone visit time during this encounter, including review of chart and various tests results, discussions about plan of care and coordination of care plan.    Onita Mattock, MD 07/08/24

## 2024-07-08 NOTE — Progress Notes (Signed)
 As per Dr. Lanny, placed order in portal for Guardant Reveal, uploaded paperwork with the order. Placed order in system paperwork taken to lab to be drawn in 2 weeks.

## 2024-07-11 ENCOUNTER — Telehealth: Payer: Self-pay | Admitting: Hematology

## 2024-07-11 NOTE — Telephone Encounter (Signed)
 Scheduled appointment per staff message. Talked with the patient and she is aware of the made appointment.

## 2024-07-22 ENCOUNTER — Inpatient Hospital Stay: Payer: Self-pay | Attending: Hematology

## 2024-07-22 DIAGNOSIS — Z79899 Other long term (current) drug therapy: Secondary | ICD-10-CM | POA: Insufficient documentation

## 2024-07-22 DIAGNOSIS — C50412 Malignant neoplasm of upper-outer quadrant of left female breast: Secondary | ICD-10-CM | POA: Insufficient documentation

## 2024-07-22 DIAGNOSIS — Z170 Estrogen receptor positive status [ER+]: Secondary | ICD-10-CM | POA: Insufficient documentation

## 2024-07-22 LAB — CMP (CANCER CENTER ONLY)
ALT: 19 U/L (ref 0–44)
AST: 22 U/L (ref 15–41)
Albumin: 4.4 g/dL (ref 3.5–5.0)
Alkaline Phosphatase: 97 U/L (ref 38–126)
Anion gap: 7 (ref 5–15)
BUN: 12 mg/dL (ref 8–23)
CO2: 31 mmol/L (ref 22–32)
Calcium: 9.6 mg/dL (ref 8.9–10.3)
Chloride: 99 mmol/L (ref 98–111)
Creatinine: 0.91 mg/dL (ref 0.44–1.00)
GFR, Estimated: >60 mL/min (ref >60)
Glucose, Bld: 107 mg/dL — ABNORMAL HIGH (ref 70–99)
Potassium: 3.5 mmol/L (ref 3.5–5.1)
Sodium: 137 mmol/L (ref 135–145)
Total Bilirubin: 0.7 mg/dL (ref 0.0–1.2)
Total Protein: 7.7 g/dL (ref 6.5–8.1)

## 2024-07-22 LAB — CBC WITH DIFFERENTIAL (CANCER CENTER ONLY)
Abs Immature Granulocytes: 0.01 K/uL (ref 0.00–0.07)
Basophils Absolute: 0 K/uL (ref 0.0–0.1)
Basophils Relative: 1 %
Eosinophils Absolute: 0.3 K/uL (ref 0.0–0.5)
Eosinophils Relative: 4 %
HCT: 39 % (ref 36.0–46.0)
Hemoglobin: 13.2 g/dL (ref 12.0–15.0)
Immature Granulocytes: 0 %
Lymphocytes Relative: 21 %
Lymphs Abs: 1.3 K/uL (ref 0.7–4.0)
MCH: 30 pg (ref 26.0–34.0)
MCHC: 33.8 g/dL (ref 30.0–36.0)
MCV: 88.6 fL (ref 80.0–100.0)
Monocytes Absolute: 0.6 K/uL (ref 0.1–1.0)
Monocytes Relative: 10 %
Neutro Abs: 3.9 K/uL (ref 1.7–7.7)
Neutrophils Relative %: 64 %
Platelet Count: 285 K/uL (ref 150–400)
RBC: 4.4 MIL/uL (ref 3.87–5.11)
RDW: 12.4 % (ref 11.5–15.5)
WBC Count: 6 K/uL (ref 4.0–10.5)
nRBC: 0 % (ref 0.0–0.2)

## 2024-08-05 ENCOUNTER — Telehealth: Payer: Self-pay

## 2024-08-05 ENCOUNTER — Encounter: Payer: Self-pay | Admitting: Hematology

## 2024-08-05 ENCOUNTER — Other Ambulatory Visit: Payer: Self-pay

## 2024-08-05 NOTE — Telephone Encounter (Signed)
 Attempted to contact the patient via telephone call. Unable to reach the patient. Left detailed vmail stating her GUARDANT REVEAL test result was Negative & to contact our office if she has any questions or concerns.

## 2024-08-08 LAB — GUARDANT REVEAL

## 2024-08-16 ENCOUNTER — Other Ambulatory Visit: Payer: Self-pay | Admitting: Internal Medicine

## 2024-11-14 ENCOUNTER — Other Ambulatory Visit: Payer: Self-pay | Admitting: Internal Medicine

## 2024-11-30 ENCOUNTER — Ambulatory Visit (HOSPITAL_COMMUNITY): Admission: RE | Admit: 2024-11-30 | Discharge: 2024-11-30 | Payer: Self-pay | Attending: Hematology

## 2024-11-30 DIAGNOSIS — C50412 Malignant neoplasm of upper-outer quadrant of left female breast: Secondary | ICD-10-CM | POA: Insufficient documentation

## 2024-11-30 DIAGNOSIS — Z170 Estrogen receptor positive status [ER+]: Secondary | ICD-10-CM | POA: Diagnosis present

## 2024-12-12 ENCOUNTER — Inpatient Hospital Stay: Attending: Hematology | Admitting: Hematology

## 2024-12-12 ENCOUNTER — Telehealth: Payer: Self-pay | Admitting: Hematology

## 2024-12-12 DIAGNOSIS — Z170 Estrogen receptor positive status [ER+]: Secondary | ICD-10-CM | POA: Insufficient documentation

## 2024-12-12 DIAGNOSIS — R911 Solitary pulmonary nodule: Secondary | ICD-10-CM

## 2024-12-12 DIAGNOSIS — C50412 Malignant neoplasm of upper-outer quadrant of left female breast: Secondary | ICD-10-CM | POA: Insufficient documentation

## 2024-12-12 DIAGNOSIS — Z79811 Long term (current) use of aromatase inhibitors: Secondary | ICD-10-CM | POA: Diagnosis not present

## 2024-12-12 DIAGNOSIS — Z1721 Progesterone receptor positive status: Secondary | ICD-10-CM | POA: Insufficient documentation

## 2024-12-12 DIAGNOSIS — Z1732 Human epidermal growth factor receptor 2 negative status: Secondary | ICD-10-CM | POA: Insufficient documentation

## 2024-12-12 DIAGNOSIS — Z1231 Encounter for screening mammogram for malignant neoplasm of breast: Secondary | ICD-10-CM

## 2024-12-12 DIAGNOSIS — Z923 Personal history of irradiation: Secondary | ICD-10-CM | POA: Diagnosis not present

## 2024-12-12 DIAGNOSIS — R918 Other nonspecific abnormal finding of lung field: Secondary | ICD-10-CM | POA: Insufficient documentation

## 2024-12-12 NOTE — Progress Notes (Signed)
 " [facility]   Telephone:([phone]) [phone] Fax:([phone]) [phone]   Clinic Follow up Note   Patient Care Team: Rollene Almarie LABOR, MD as PCP - General (Internal Medicine) Crawford, Morna Pickle, NP as Nurse Practitioner (Hematology and Oncology) Lanny Callander, MD as Consulting Physician (Hematology) Dewey Rush, MD as Consulting Physician (Radiation Oncology) Ebbie Cough, MD as Consulting Physician (General Surgery) 12/12/2024  I connected with [name] on 12/12/2024 at  2:00 PM EST by telephone and verified that I am speaking with the correct person using two identifiers.   I discussed the limitations, risks, security and privacy concerns of performing an evaluation and management service by telephone and the availability of in person appointments. I also discussed with the patient that there may be a patient responsible charge related to this service. The patient expressed understanding and agreed to proceed.   Patient's location:  Home  Provider's location:  Office    CHIEF COMPLAINT: review CT    CURRENT THERAPY: Cancer surveillance  Oncology history Malignant neoplasm of upper-outer quadrant of left breast in female, estrogen receptor positive (HCC)  invasive lobular carcinoma, grade 1, pT2N0M0, ER+/PR+/HER2-, Oncotype RS low risk -diagnosed in 12/2016. S/p left breast lumpectomy 02/16/17, path showed 2.1+ cm ILC and LCIS. -Oncotype RS of 12, low risk. -She started adjuvant letrozole  in 06/2017. Due to joint pain and headaches, she switched to exemestane  in 11/2017. She is tolerating very well with no noticeable side effects. -most recent MM 04/01/22 was negative. -She is clinically doing well except recent back surgery, which she is slowly healing from. Lab reviewed, overall WNL. Her physical exam showed some left breast tenderness and mild swelling, otherwise unremarkable. There is no high clinical concern for recurrence. -Continue Surveillance.  -she completed  7 years exemestane  in July 2025   Assessment & Plan Pulmonary nodules of the left lung Pulmonary nodules in the left lung, largest measuring 7 mm, have demonstrated radiographic stability with no interval change on serial CT scans over the past year. Absence of tobacco use further reduces malignancy risk. Nodules are likely benign. - Ordered follow-up chest CT in one year to monitor for interval change. - Advised her to report any new symptoms or concerns promptly.  History of breast cancer in remission Breast cancer diagnosed in 2018, now in remission following completion of seven years of endocrine therapy, most recently exemestane , discontinued July of last year. Recent surveillance, including a negative mammogram in August, shows no evidence of recurrence. Risk of recurrence is significantly reduced given time since diagnosis and treatment completion. - Ordered screening mammogram at the breast center for April. - Continue annual surveillance and follow-up visits as scheduled.  Plan - I personally reviewed her CT chest and compared to prior images, overall lung nodules are stable - Follow-up in 12 months with a repeated CT chest without contrast   SUMMARY OF ONCOLOGIC HISTORY: Oncology History Overview Note  Cancer Staging Malignant neoplasm of upper-outer quadrant of left breast in female, estrogen receptor positive (HCC) Staging form: Breast, AJCC 8th Edition - Clinical stage from 01/15/2017: Stage IA (cT1c, cN0, cM0, G1, ER: Positive, PR: Positive, HER2: Negative) - Signed by Callander Lanny, MD on 01/27/2017    Malignant neoplasm of upper-outer quadrant of left breast in female, estrogen receptor positive (HCC)  01/15/2017 Initial Biopsy   Left breast 2:00 core needle biopsy showed invasive lobular carcinoma, atypical lobular hyperplasia. Grade 1.   01/15/2017 Receptors her2   ER 90% positive, PR 90% positive, HER-2 negative,  Ki-67 1%   01/15/2017 Mammogram   Bilateral diagnostic  mammogram and ultrasound showed irregular hypoechoic mass in the left breast 2:00 position, 9 cm from the nipple, measuring 1.2 x 1.0 x 0.8 cm, no evidence of malignancy within the right breast, ultrasound of the left axilla was negative. Breast density category B   01/15/2017 Initial Diagnosis   Malignant neoplasm of upper-outer quadrant of left breast in female, estrogen receptor positive (HCC)   02/16/2017 Pathology Results   Diagnosis 1. Breast, lumpectomy, Left - INVASIVE GRADE I LOBULAR CARCINOMA. - TUMOR IS ESTIMATED TO SPAN AT LEAST 2.1 CM IN GREATEST DIMENSION. 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 - INVASIVE TUMOR IS FOCALLY LESS THAN 0.1 CM TO LATERAL MARGIN BUT INKED MARGINAL SURFACE IS NEGATIVE FOR TUMOR. - INITIAL ANTERIOR MARGIN CANNOT BE EFFECTIVELY CLEARED OF INVASIVE TUMOR, PLEASE SEE SPECIMEN #10 FOR FINAL MARGIN STATUS. - SEE ONCOLOGY TEMPLATE. 2. 4 left axillary sentinel lymph nodes were negative 3. Additional resection of left posterior, superior, anterior and inferior margins were benign.    02/16/2017 Surgery   breast lumpectomy with radioactive seed and axillary sentinel lymph node biposy with Dr. Ebbie   02/16/2017 Oncotype testing   Testing resulted in reccurance score of 12.   02/16/2017 Pathology Results   Left breast lumpectomy showed a 2.1 cm invasive grade 1 lobular carcinoma, associated LCIS and atypical lobular hyperplasia. Surgical margins were negative. 4 sentinel lymph nodes were negative.   02/16/2017 Oncotype testing   RS 12, low risk, predicts 10 year risk of distant recurrence 8% with tamoxifen, adjuvant chemotherapy was not recommended.   03/09/2017 Imaging    IMPRESSION: Large complex fluid collection extending throughout the upper left breast, 3:00 to 9:00 axes, at posterior depth, presumed hematoma, possibly multiloculated.   03/18/2017 Surgery    Evacuation seroma left bresat with Dr. Ebbie     04/21/2017 - 06/08/2017 Radiation Therapy   Patient received radiation treatment with Dr. Dewey   06/2017 -  Anti-estrogen oral therapy   Letrozole  daily. Switch to Exemestane  in 11/2017 due to joint pain.    09/03/2017 Survivorship   Survivorship Clinic with NP    01/19/2018 Mammogram   IMPRESSION: No mammographic evidence of breast malignancy.     Discussed the use of AI scribe software for clinical note transcription with the patient, who gave verbal consent to proceed.  History of Present Illness [name] is a 68 year old female with breast cancer who presents for follow-up of stable pulmonary nodules and ongoing cancer surveillance.  Serial CT imaging has shown stable pulmonary nodules, with the largest in the left lung measuring 7 mm. She has no tobacco use history.  Her breast cancer was diagnosed in 2018. She completed seven years of endocrine therapy, initially with Trezor then exemestane , which was stopped in July 2024 when her supply ran out. Surveillance mammography in August showed no evidence of recurrence, and her next mammogram is planned for April.     REVIEW OF SYSTEMS:   Constitutional: Denies fevers, chills or abnormal weight loss Eyes: Denies blurriness of vision Ears, nose, mouth, throat, and face: Denies mucositis or sore throat Respiratory: Denies cough, dyspnea or wheezes Cardiovascular: Denies palpitation, chest discomfort or lower extremity swelling Gastrointestinal:  Denies nausea, heartburn or change in bowel habits Skin: Denies abnormal skin rashes Lymphatics: Denies new lymphadenopathy or easy bruising Neurological:Denies numbness, tingling or new weaknesses Behavioral/Psych: Mood is stable, no new changes  All other systems were reviewed  with the patient and are negative.  MEDICAL HISTORY:  Past Medical History:  Diagnosis Date   Arthritis    Breast cancer (HCC) 01/15/2017   left breast   Cancer (HCC)    GERD (gastroesophageal reflux  disease)    HLD (hyperlipidemia)    Hypertension    Hypothyroidism    Obesity    Overactive bladder    Palpitation    Personal history of radiation therapy    Thyroid  disease     SURGICAL HISTORY: Past Surgical History:  Procedure Laterality Date   bladder stem stretch  1966   BREAST LUMPECTOMY Left 02/16/2017   BREAST LUMPECTOMY WITH RADIOACTIVE SEED AND SENTINEL LYMPH NODE BIOPSY Left 02/16/2017   Procedure: BREAST LUMPECTOMY WITH RADIOACTIVE SEED AND AXILLARY SENTINEL LYMPH NODE BIOPSY;  Surgeon: Donnice Bury, MD;  Location: [facility];  Service: General;  Laterality: Left;   EVACUATION BREAST HEMATOMA Left 03/18/2017   Procedure: EVACUATION SEROMA LEFT BREAST;  Surgeon: Donnice Bury, MD;  Location: [facility];  Service: General;  Laterality: Left;   JOINT REPLACEMENT     KNEE ARTHROSCOPY     LEFT   NOSE SURGERY  1980   SHOULDER SURGERY Left 2007   TONSILLECTOMY  1964   TOTAL HIP ARTHROPLASTY Left 10/23/2015   Procedure: TOTAL HIP ARTHROPLASTY ANTERIOR APPROACH;  Surgeon: Maude Herald, MD;  Location: [facility];  Service: Orthopedics;  Laterality: Left;   TRANSFORAMINAL LUMBAR INTERBODY FUSION (TLIF) WITH PEDICLE SCREW FIXATION 2 LEVEL Left 08/28/2022   Procedure: LEFT-SIDED LUMBAR 3- LUMBAR 4, LUMBAR 4- LUMBAR 5 TRANSFORAMINAL LUMBAR INTERBODY FUSION AND DECOMPRESSION WITH INSTRUMENTATION AND ALLOGRAFT;  Surgeon: Beuford Anes, MD;  Location: [facility];  Service: Orthopedics;  Laterality: Left;    I have reviewed the social history and family history with the patient and they are unchanged from previous note.  ALLERGIES:  has no known allergies.  MEDICATIONS:  Current Outpatient Medications  Medication Sig Dispense Refill   acetaminophen  (TYLENOL ) 500 MG tablet Take 500-1,000 mg by mouth every 6 (six) hours as needed for moderate pain.     clobetasol  cream (TEMOVATE ) 0.05 % APPLY 1 APPLICATION TOPICALLY 2 (TWO) TIMES DAILY AS NEEDED (ITCHING).  30 g 6   diphenhydramine -acetaminophen  (TYLENOL  PM) 25-500 MG TABS tablet Take 1 tablet by mouth at bedtime as needed (sleep).     exemestane  (AROMASIN ) 25 MG tablet TAKE 1 TABLET (25 MG TOTAL) BY MOUTH DAILY AFTER BREAKFAST. 90 tablet 3   gabapentin  (NEURONTIN ) 100 MG capsule Take 1 capsule (100 mg total) by mouth 3 (three) times daily. 90 capsule 1   hydrochlorothiazide  (HYDRODIURIL ) 25 MG tablet TAKE 1 TABLET (25 MG TOTAL) BY MOUTH DAILY. 90 tablet 3   levothyroxine  (SYNTHROID ) 75 MCG tablet TAKE 1 TABLET BY MOUTH EVERY DAY 90 tablet 0   metoprolol  succinate (TOPROL -XL) 25 MG 24 hr tablet TAKE 1 TABLET (25 MG TOTAL) BY MOUTH DAILY. 90 tablet 3   pantoprazole  (PROTONIX ) 40 MG tablet TAKE 1 TABLET BY MOUTH EVERY DAY 90 tablet 3   simvastatin  (ZOCOR ) 20 MG tablet TAKE 1 TABLET BY MOUTH EVERY DAY 90 tablet 3   VITAMIN D  PO Take 1 capsule by mouth daily.     No current facility-administered medications for this visit.    PHYSICAL EXAMINATION: Not performed   LABORATORY DATA:  I have reviewed the data as listed    Latest Ref Rng & Units 07/22/2024    7:41 AM 05/03/2024    8:05 AM 05/06/2023  7:52 AM  CBC  WBC 4.0 - 10.5 K/uL 6.0  5.5  5.8   Hemoglobin 12.0 - 15.0 g/dL 86.7  86.2  85.8   Hematocrit 36.0 - 46.0 % 39.0  40.2  43.1   Platelets 150 - 400 K/uL 285  286  321         Latest Ref Rng & Units 07/22/2024    7:41 AM 05/03/2024    8:05 AM 05/06/2023    7:52 AM  CMP  Glucose 70 - 99 mg/dL 892  91  97   BUN 8 - 23 mg/dL 12  16  11    Creatinine 0.44 - 1.00 mg/dL 9.08  9.13  9.06   Sodium 135 - 145 mmol/L 137  137  139   Potassium 3.5 - 5.1 mmol/L 3.5  3.7  3.5   Chloride 98 - 111 mmol/L 99  101  103   CO2 22 - 32 mmol/L 31  29  29    Calcium 8.9 - 10.3 mg/dL 9.6  9.8  9.6   Total Protein 6.5 - 8.1 g/dL 7.7  7.6  7.9   Total Bilirubin 0.0 - 1.2 mg/dL 0.7  0.7  0.6   Alkaline Phos 38 - 126 U/L 97  94  95   AST 15 - 41 U/L 22  21  17    ALT 0 - 44 U/L 19  23  16         RADIOGRAPHIC STUDIES: I have personally reviewed the radiological images as listed and agreed with the findings in the report. No results found.     I discussed the assessment and treatment plan with the patient. The patient was provided an opportunity to ask questions and all were answered. The patient agreed with the plan and demonstrated an understanding of the instructions.   The patient was advised to call back or seek an in-person evaluation if the symptoms worsen or if the condition fails to improve as anticipated.  I provided 25 minutes of non face-to-face telephone visit time during this encounter, including review of chart and various tests results, discussions about plan of care and coordination of care plan.    Onita Mattock, MD 12/12/2024     "

## 2024-12-14 NOTE — Assessment & Plan Note (Signed)
"   invasive lobular carcinoma, grade 1, pT2N0M0, ER+/PR+/HER2-, Oncotype RS low risk -diagnosed in 12/2016. S/p left breast lumpectomy 02/16/17, path showed 2.1+ cm ILC and LCIS. -Oncotype RS of 12, low risk. -She started adjuvant letrozole  in 06/2017. Due to joint pain and headaches, she switched to exemestane  in 11/2017. She is tolerating very well with no noticeable side effects. -most recent MM 04/01/22 was negative. -She is clinically doing well except recent back surgery, which she is slowly healing from. Lab reviewed, overall WNL. Her physical exam showed some left breast tenderness and mild swelling, otherwise unremarkable. There is no high clinical concern for recurrence. -Continue Surveillance.  -she completed 7 years exemestane  in July 2025  "

## 2025-05-08 ENCOUNTER — Other Ambulatory Visit

## 2025-05-08 ENCOUNTER — Ambulatory Visit: Admitting: Nurse Practitioner
